# Patient Record
Sex: Male | Born: 1960 | Race: Black or African American | Hispanic: No | Smoking: Current every day smoker
Health system: Southern US, Community
[De-identification: ages and names within clinical notes are randomized; demographics above are authoritative.]

## PROBLEM LIST (undated history)

## (undated) DIAGNOSIS — I10 Essential (primary) hypertension: Secondary | ICD-10-CM

## (undated) DIAGNOSIS — E739 Lactose intolerance, unspecified: Secondary | ICD-10-CM

## (undated) DIAGNOSIS — F329 Major depressive disorder, single episode, unspecified: Secondary | ICD-10-CM

## (undated) DIAGNOSIS — F32A Depression, unspecified: Secondary | ICD-10-CM

## (undated) DIAGNOSIS — K219 Gastro-esophageal reflux disease without esophagitis: Secondary | ICD-10-CM

## (undated) DIAGNOSIS — G43909 Migraine, unspecified, not intractable, without status migrainosus: Secondary | ICD-10-CM

## (undated) DIAGNOSIS — F419 Anxiety disorder, unspecified: Secondary | ICD-10-CM

---

## 1998-02-12 ENCOUNTER — Emergency Department (HOSPITAL_COMMUNITY): Admission: EM | Admit: 1998-02-12 | Discharge: 1998-02-12 | Payer: Self-pay | Admitting: Emergency Medicine

## 1998-02-20 ENCOUNTER — Emergency Department (HOSPITAL_COMMUNITY): Admission: EM | Admit: 1998-02-20 | Discharge: 1998-02-20 | Payer: Self-pay | Admitting: Emergency Medicine

## 1999-07-07 ENCOUNTER — Encounter: Payer: Self-pay | Admitting: Emergency Medicine

## 1999-07-07 ENCOUNTER — Emergency Department (HOSPITAL_COMMUNITY): Admission: EM | Admit: 1999-07-07 | Discharge: 1999-07-07 | Payer: Self-pay | Admitting: Emergency Medicine

## 2000-02-19 ENCOUNTER — Emergency Department (HOSPITAL_COMMUNITY): Admission: EM | Admit: 2000-02-19 | Discharge: 2000-02-19 | Payer: Self-pay | Admitting: Emergency Medicine

## 2000-03-30 ENCOUNTER — Emergency Department (HOSPITAL_COMMUNITY): Admission: EM | Admit: 2000-03-30 | Discharge: 2000-03-30 | Payer: Self-pay | Admitting: Emergency Medicine

## 2001-06-22 ENCOUNTER — Emergency Department (HOSPITAL_COMMUNITY): Admission: EM | Admit: 2001-06-22 | Discharge: 2001-06-22 | Payer: Self-pay

## 2005-06-17 ENCOUNTER — Ambulatory Visit: Payer: Self-pay | Admitting: Psychiatry

## 2005-06-17 ENCOUNTER — Inpatient Hospital Stay (HOSPITAL_COMMUNITY): Admission: EM | Admit: 2005-06-17 | Discharge: 2005-06-20 | Payer: Self-pay | Admitting: Psychiatry

## 2005-07-10 ENCOUNTER — Emergency Department (HOSPITAL_COMMUNITY): Admission: EM | Admit: 2005-07-10 | Discharge: 2005-07-10 | Payer: Self-pay | Admitting: Emergency Medicine

## 2006-03-02 ENCOUNTER — Inpatient Hospital Stay (HOSPITAL_COMMUNITY): Admission: RE | Admit: 2006-03-02 | Discharge: 2006-03-06 | Payer: Self-pay | Admitting: Psychiatry

## 2006-03-02 ENCOUNTER — Emergency Department (HOSPITAL_COMMUNITY): Admission: EM | Admit: 2006-03-02 | Discharge: 2006-03-02 | Payer: Self-pay | Admitting: Emergency Medicine

## 2006-03-03 ENCOUNTER — Ambulatory Visit: Payer: Self-pay | Admitting: *Deleted

## 2006-04-12 ENCOUNTER — Emergency Department (HOSPITAL_COMMUNITY): Admission: EM | Admit: 2006-04-12 | Discharge: 2006-04-12 | Payer: Self-pay | Admitting: Emergency Medicine

## 2006-04-21 ENCOUNTER — Emergency Department (HOSPITAL_COMMUNITY): Admission: EM | Admit: 2006-04-21 | Discharge: 2006-04-21 | Payer: Self-pay | Admitting: Emergency Medicine

## 2006-09-05 ENCOUNTER — Emergency Department (HOSPITAL_COMMUNITY): Admission: EM | Admit: 2006-09-05 | Discharge: 2006-09-05 | Payer: Self-pay | Admitting: Emergency Medicine

## 2006-09-07 ENCOUNTER — Emergency Department (HOSPITAL_COMMUNITY): Admission: EM | Admit: 2006-09-07 | Discharge: 2006-09-07 | Payer: Self-pay | Admitting: Emergency Medicine

## 2006-10-06 ENCOUNTER — Emergency Department (HOSPITAL_COMMUNITY): Admission: EM | Admit: 2006-10-06 | Discharge: 2006-10-06 | Payer: Self-pay | Admitting: Emergency Medicine

## 2006-10-07 ENCOUNTER — Emergency Department (HOSPITAL_COMMUNITY): Admission: EM | Admit: 2006-10-07 | Discharge: 2006-10-07 | Payer: Self-pay | Admitting: Emergency Medicine

## 2006-12-16 ENCOUNTER — Emergency Department (HOSPITAL_COMMUNITY): Admission: EM | Admit: 2006-12-16 | Discharge: 2006-12-16 | Payer: Self-pay | Admitting: Emergency Medicine

## 2007-03-29 ENCOUNTER — Emergency Department (HOSPITAL_COMMUNITY): Admission: EM | Admit: 2007-03-29 | Discharge: 2007-03-29 | Payer: Self-pay | Admitting: Emergency Medicine

## 2007-07-17 ENCOUNTER — Emergency Department (HOSPITAL_COMMUNITY): Admission: EM | Admit: 2007-07-17 | Discharge: 2007-07-17 | Payer: Self-pay | Admitting: Emergency Medicine

## 2007-09-14 ENCOUNTER — Emergency Department (HOSPITAL_COMMUNITY): Admission: EM | Admit: 2007-09-14 | Discharge: 2007-09-14 | Payer: Self-pay | Admitting: Emergency Medicine

## 2007-09-25 ENCOUNTER — Emergency Department (HOSPITAL_COMMUNITY): Admission: EM | Admit: 2007-09-25 | Discharge: 2007-09-26 | Payer: Self-pay | Admitting: Emergency Medicine

## 2008-04-07 ENCOUNTER — Emergency Department (HOSPITAL_COMMUNITY): Admission: EM | Admit: 2008-04-07 | Discharge: 2008-04-07 | Payer: Self-pay | Admitting: Emergency Medicine

## 2008-06-10 ENCOUNTER — Emergency Department (HOSPITAL_BASED_OUTPATIENT_CLINIC_OR_DEPARTMENT_OTHER): Admission: EM | Admit: 2008-06-10 | Discharge: 2008-06-10 | Payer: Self-pay | Admitting: Emergency Medicine

## 2008-07-11 ENCOUNTER — Emergency Department (HOSPITAL_BASED_OUTPATIENT_CLINIC_OR_DEPARTMENT_OTHER): Admission: EM | Admit: 2008-07-11 | Discharge: 2008-07-11 | Payer: Self-pay | Admitting: Emergency Medicine

## 2008-10-07 ENCOUNTER — Emergency Department (HOSPITAL_COMMUNITY): Admission: EM | Admit: 2008-10-07 | Discharge: 2008-10-07 | Payer: Self-pay | Admitting: Emergency Medicine

## 2009-01-22 ENCOUNTER — Emergency Department (HOSPITAL_COMMUNITY): Admission: EM | Admit: 2009-01-22 | Discharge: 2009-01-22 | Payer: Self-pay | Admitting: Emergency Medicine

## 2009-04-06 ENCOUNTER — Emergency Department (HOSPITAL_COMMUNITY): Admission: EM | Admit: 2009-04-06 | Discharge: 2009-04-06 | Payer: Self-pay | Admitting: Emergency Medicine

## 2009-10-27 ENCOUNTER — Emergency Department (HOSPITAL_COMMUNITY): Admission: EM | Admit: 2009-10-27 | Discharge: 2009-10-27 | Payer: Self-pay | Admitting: Emergency Medicine

## 2010-01-28 ENCOUNTER — Emergency Department (HOSPITAL_COMMUNITY): Admission: EM | Admit: 2010-01-28 | Discharge: 2010-01-28 | Payer: Self-pay | Admitting: Emergency Medicine

## 2010-03-31 ENCOUNTER — Emergency Department (HOSPITAL_COMMUNITY): Admission: EM | Admit: 2010-03-31 | Discharge: 2010-03-31 | Payer: Self-pay | Admitting: Emergency Medicine

## 2010-04-24 ENCOUNTER — Emergency Department (HOSPITAL_COMMUNITY): Admission: EM | Admit: 2010-04-24 | Discharge: 2010-04-25 | Payer: Self-pay | Admitting: Emergency Medicine

## 2010-05-12 ENCOUNTER — Emergency Department (HOSPITAL_COMMUNITY): Admission: EM | Admit: 2010-05-12 | Discharge: 2010-05-12 | Payer: Self-pay | Admitting: Emergency Medicine

## 2010-05-22 ENCOUNTER — Emergency Department (HOSPITAL_COMMUNITY): Admission: EM | Admit: 2010-05-22 | Discharge: 2010-05-22 | Payer: Self-pay | Admitting: Emergency Medicine

## 2010-08-08 ENCOUNTER — Emergency Department (HOSPITAL_COMMUNITY): Admission: EM | Admit: 2010-08-08 | Discharge: 2010-08-08 | Payer: Self-pay | Admitting: Emergency Medicine

## 2010-10-20 ENCOUNTER — Emergency Department (HOSPITAL_COMMUNITY)
Admission: EM | Admit: 2010-10-20 | Discharge: 2010-10-20 | Payer: Self-pay | Source: Home / Self Care | Admitting: Emergency Medicine

## 2010-11-03 ENCOUNTER — Emergency Department (HOSPITAL_COMMUNITY)
Admission: EM | Admit: 2010-11-03 | Discharge: 2010-11-03 | Payer: Self-pay | Source: Home / Self Care | Admitting: Emergency Medicine

## 2010-11-18 ENCOUNTER — Emergency Department (HOSPITAL_COMMUNITY)
Admission: EM | Admit: 2010-11-18 | Discharge: 2010-11-18 | Payer: Self-pay | Source: Home / Self Care | Admitting: Emergency Medicine

## 2010-12-02 ENCOUNTER — Emergency Department (HOSPITAL_COMMUNITY)
Admission: EM | Admit: 2010-12-02 | Discharge: 2010-12-02 | Disposition: A | Discharge status: Home / Self Care | Payer: Self-pay | Attending: Emergency Medicine | Admitting: Emergency Medicine

## 2010-12-02 DIAGNOSIS — I10 Essential (primary) hypertension: Secondary | ICD-10-CM | POA: Insufficient documentation

## 2010-12-02 DIAGNOSIS — K219 Gastro-esophageal reflux disease without esophagitis: Secondary | ICD-10-CM | POA: Insufficient documentation

## 2010-12-02 DIAGNOSIS — R51 Headache: Secondary | ICD-10-CM | POA: Insufficient documentation

## 2011-01-14 LAB — POCT I-STAT, CHEM 8
BUN: 8 mg/dL (ref 6–23)
Calcium, Ion: 1.04 mmol/L — ABNORMAL LOW (ref 1.12–1.32)
Chloride: 107 mEq/L (ref 96–112)
Creatinine, Ser: 1.4 mg/dL (ref 0.4–1.5)
Glucose, Bld: 93 mg/dL (ref 70–99)
HCT: 47 % (ref 39.0–52.0)
Hemoglobin: 16 g/dL (ref 13.0–17.0)
Potassium: 3.9 mEq/L (ref 3.5–5.1)
Sodium: 140 mEq/L (ref 135–145)
TCO2: 22 mmol/L (ref 0–100)

## 2011-01-14 LAB — ETHANOL: Alcohol, Ethyl (B): 74 mg/dL — ABNORMAL HIGH (ref 0–10)

## 2011-02-06 LAB — DIFFERENTIAL
Basophils Absolute: 0.2 10*3/uL — ABNORMAL HIGH (ref 0.0–0.1)
Basophils Relative: 2 % — ABNORMAL HIGH (ref 0–1)
Eosinophils Absolute: 0 10*3/uL (ref 0.0–0.7)
Eosinophils Relative: 0 % (ref 0–5)
Lymphs Abs: 2.5 10*3/uL (ref 0.7–4.0)
Neutrophils Relative %: 69 % (ref 43–77)

## 2011-02-06 LAB — URINALYSIS, ROUTINE W REFLEX MICROSCOPIC
Glucose, UA: NEGATIVE mg/dL
Nitrite: NEGATIVE
Specific Gravity, Urine: 1.029 (ref 1.005–1.030)
pH: 6 (ref 5.0–8.0)

## 2011-02-06 LAB — COMPREHENSIVE METABOLIC PANEL
ALT: 21 U/L (ref 0–53)
AST: 21 U/L (ref 0–37)
Alkaline Phosphatase: 69 U/L (ref 39–117)
CO2: 28 mEq/L (ref 19–32)
Chloride: 106 mEq/L (ref 96–112)
GFR calc Af Amer: 57 mL/min — ABNORMAL LOW (ref >60)
GFR calc non Af Amer: 47 mL/min — ABNORMAL LOW (ref >60)
Glucose, Bld: 92 mg/dL (ref 70–99)
Potassium: 4.1 mEq/L (ref 3.5–5.1)
Sodium: 143 mEq/L (ref 135–145)
Total Bilirubin: 0.5 mg/dL (ref 0.3–1.2)

## 2011-02-06 LAB — RAPID URINE DRUG SCREEN, HOSP PERFORMED
Cocaine: POSITIVE — AB
Opiates: NOT DETECTED
Tetrahydrocannabinol: NOT DETECTED

## 2011-02-06 LAB — CBC
Hemoglobin: 15 g/dL (ref 13.0–17.0)
RBC: 5.45 MIL/uL (ref 4.22–5.81)
WBC: 10.6 10*3/uL — ABNORMAL HIGH (ref 4.0–10.5)

## 2011-03-17 NOTE — H&P (Signed)
NAME:  Alexander Munoz, BELMARES NO.:  [id number]   MEDICAL RECORD NO.:  [id number]          PATIENT TYPE:  IPS   LOCATION:  0305                          FACILITY:  BH   PHYSICIAN:  Anselm Jungling, MD  DATE OF BIRTH:  [birth year]   DATE OF ADMISSION:  03/02/2006  DATE OF DISCHARGE:                         PSYCHIATRIC ADMISSION ASSESSMENT   IDENTIFYING INFORMATION:  This is a 50 year old separated African-American  male who presented to the [facility] yesterday  asking for detoxification from crack and alcohol.  He was medically cleared  at [facility].  He was complaining of some chest pain, however, his EKG  confirmed tachycardia only.  Mirko was last with [place] in August 2006.  That was  his first admission here.  He stated at that time that he had had many prior  inpatient treatments for substance abuse and he decided to try again on his  own to give up substances.  Since leaving here last August he apparently  enrolled in [address] in [place] through the [employer].  Unfortunately he relapsed in January and lost his placement at [facility].  Since January he has been working part-time doing temporary jobs  and is saying that once again he is ready to try to get clean and sober.   PAST PSYCHIATRIC HISTORY:  As already stated he was here with [place] last August  in 2006.  He has had many other inpatient treatment stays.   SOCIAL HISTORY:  He is separated from his wife since 2003.  He has two  children, a son 26 that he does not see, his son lives in [place].  He has  had some contact with his 42-year-old daughter mostly by phone.  Both of  these children have different mothers and he never married those mothers.   ALCOHOL AND DRUG ABUSE HISTORY:  He began using drugs at age 39,  specifically cocaine.  He also uses alcohol.  Yesterday his alcohol level  was less than 5.  His UDS was positive for cocaine.   PRIMARY CARE Harjas Biggins:   He is currently enrolled for primary care at the [facility] in [place].   MEDICAL PROBLEMS:  He has migraines treated with Imitrex injections as well  as ibuprofen 800 mg.   ALLERGIES:  No known drug allergies.   PHYSICAL EXAMINATION:  GENERAL:  Well-developed, well-nourished African-  American male who appears his stated age.  He was medically cleared at  [facility].  He had no remarkable findings.  VITAL SIGNS:  On admission showed that he is 67 inches tall, he weighs 184,  blood pressure 170/100, pulse 58, respirations 20, temperature 98.7.  MENTAL STATUS EXAM:  He is alert and oriented x3.  He is appropriately  groomed, dressed and nourished.  His motor and gait are normal.  He had good  eye contact.  His speech had a normal rate, rhythm and tone.  His mood is a  little depressed.  His affect is congruent, however, it is appropriate to  his situation.  His thought  processes are clear, rationale and goal  oriented.  Judgment and insight are fair.  Concentration and memory are  intact.  Intelligence is at least average.  He denies suicidal or homicidal  ideation.  He denies auditory or visual hallucinations.  He denies the need  to start any antidepressant at this point in time.  He states that when he  is not using drugs he is not depressed.   DIAGNOSES:  Axis I.  Polysubstance dependence, crack and alcohol, substance  induced mood disorder.  Axis II.  No diagnosis.  Axis III.  Migraines.  Axis IV.  Problems with primary support group, housing problem, economic  problems.  __________   PLAN:  The plan is to admit for detoxification from crack and toward that  end he was begun on the low dose Librium protocol.  We will also have the  case manager to call the [place] in [place].  They have a SARTP (substance  abuse recovery treatment program) it is a 30-day in-house program and he  could then transition to the IOP, etc.  The telephone number there is [phone], ask for Tiana Loft for a bed date.      Mickie Leonarda Salon, P.A.-C.      Anselm Jungling, MD  Electronically Signed    MD/MEDQ  D:  03/03/2006  T:  03/04/2006  Job:  [id number]

## 2011-03-17 NOTE — H&P (Signed)
NAME:  Alexander Munoz, LAPINSKY NO.:  [id number]   MEDICAL RECORD NO.:  [id number]          PATIENT TYPE:  IPS   LOCATION:  0304                          FACILITY:  BH   PHYSICIAN:  Anselm Jungling, MD  DATE OF BIRTH:  [birth year]   DATE OF ADMISSION:  06/17/2005  DATE OF DISCHARGE:                         PSYCHIATRIC ADMISSION ASSESSMENT   IDENTIFYING INFORMATION:  This is a 50 year old separated African-American  male.  The patient went to [facility] yesterday asking  for help with substance abuse.  He was sent to [facility] for clearance.  He told the emergency department staff there that he felt like he was at his  end.  The patient has had prior treatment for substance abuse.  In 1987 he  was at [facility] and in 2004 he was at the [facility] program in [place].   SOCIAL HISTORY:  He is a high school grad.  He has worked as a Curator.  He  has been married once.  He has 2 children by different mothers and he did  not marry any of these people.  His son is 75; his daughter is 7.   FAMILY HISTORY:  He denies.   ALCOHOL AND DRUG ABUSE:  He began using drugs at age 27.  He is currently  using $150 a day of crack for the past 2-1/2 months.  He has been drinking  liquor, a fifth a day for the past 2 days.  He was drinking beer.  He had  three 40-ounces yesterday.  At [facility], his CIWA was  47.  His alcohol level was 23, and his urine drug screen was positive for  cocaine only.  He does smoke.  He smokes 1 pack a day; not sure exactly how  many years he has been smoking.   PAST MEDICAL HISTORY:  He has no primary care Dasia Guerrier.  He is not  prescribed medications.  He has no known medical problems.  He denies any  drug allergies.   POSITIVE PHYSICAL FINDINGS:  PHYSICAL EXAMINATION:  He appears to be a well-  nourished, well-developed African-American male who appears his stated age.  His vital signs were unremarkable.  There was no  elevation and he has no  evidence for withdrawal.   MENTAL STATUS EXAM:  He is alert and oriented x3.  He is drowsy but  arousable.  He is appropriately groomed, dressed and nourished.  His speech  is soft.  He responds briefly.  His mood is depressed.  His affect is  congruent.  This thought processes are clear, rational and goal oriented.  He specifically requests getting hooked up with the [place].  Judgment and  insight are fair.  Concentration and memory are intact.  Intelligence is  average.  Today, he is not actively suicidal.  He was never homicidal.  He  denies auditory or visual hallucinations.   ADMISSION DIAGNOSES:  AXIS I:  Polysubstance dependence, depressed at  present.  AXIS II:  Deferred.  AXIS III:  History for migraines treated with Imitrex in the past.  AXIS  IV:  Severe, problems with primary support group, occupational,  housing, economic issues.  He has had prison time due to child support  issues and theft.  AXIS V:  Global assessment of function is 25.   PLAN:  The plan is to help him withdraw from his substances.  Towards that  end, he was begun on the low-dose Librium protocol.  He will be started on  Wellbutrin XL 150 mg p.o. daily as he is neurovegetatively depressed, and  also he smokes.  We will have the social worker make contact with the [place].  They have 2 programs in [place], the homeless vet program as well as  [facility].  He may be able to transfer to that longer program once he detoxes  here.      Mickie Leonarda Salon, P.A.-C.      Anselm Jungling, MD  Electronically Signed    MD/MEDQ  D:  06/17/2005  T:  06/17/2005  Job:  [id number]

## 2011-03-17 NOTE — Discharge Summary (Signed)
NAME:  Alexander Munoz, Alexander Munoz NO.:  [id number]   MEDICAL RECORD NO.:  [id number]          PATIENT TYPE:  IPS   LOCATION:  0305                          FACILITY:  BH   PHYSICIAN:  Anselm Jungling, MD  DATE OF BIRTH:  1960-12-18   DATE OF ADMISSION:  03/02/2006  DATE OF DISCHARGE:  03/06/2006                                 DISCHARGE SUMMARY   IDENTIFYING DATA AND REASON FOR ADMISSION:  The patient is a 50 year old  separated African-American male who presented to [facility] asking for detoxification from crack cocaine and alcohol.  He was  medically cleared at [facility].  He was last with [place] in August of  2006.  Please refer to the admission note for further details pertaining to  the symptoms, circumstances, and history that led to his hospitalization.  He was given an initial Axis I diagnosis of polysubstance dependence and  substance-induced mood disorder.   MEDICAL AND LABORATORY:  The patient was medically cleared at [facility] prior to transfer to our inpatient  psychiatric service.  Once  here, he was medically and physically assessed by the psychiatric nurse  practitioner.  He came to [place] with a history of migraines treated with  Imitrex injections and ibuprofen.  He had had some chest pain that was  evaluated at [facility], and an EKG showed tachycardia only.  There were no  significant medical issues during this brief inpatient psychiatric stay.   HOSPITAL COURSE:  The patient was admitted to the adult inpatient  psychiatric service and placed on a withdrawal protocol.  He was given  Symmetrel 100 mg b.i.d. for cocaine craving.  He presented as a well-  nourished, normally developed, pleasant and cooperative individual who  seemed to have a sincere desire to become clean and sober.  He expressed  strong interest in obtaining follow-up chemical dependency services through  the [employer] but was not able  to arrange this by the time of  discharge.   The patient was felt to be ready for discharge on the fifth hospital day.   AFTERCARE:  The patient was to follow up with Dr. Lang Snow at [facility] on Mar 08, 2006, and he was to [facility] himself at [facility] [place] any Monday through Friday on a walk-in basis.  From there, he could have outpatient chemical dependency treatment.   DISCHARGE MEDICATIONS:  Symmetrel 100 mg b.i.d..   DISCHARGE DIAGNOSES:  AXIS I:  Polysubstance dependence, early remission.  AXIS II:  Deferred.  AXIS III:  History of migraine.  AXIS IV:  Stressors severe.  AXIS V:  Global Assessment of Functioning on discharge 60.          ______________________________  Anselm Jungling, MD  Electronically Signed    SPB/MEDQ  D:  03/07/2006  T:  03/08/2006  Job:  [id number]

## 2011-03-17 NOTE — Discharge Summary (Signed)
NAME:  BROC, CASPERS NO.:  [id number]   MEDICAL RECORD NO.:  [id number]          PATIENT TYPE:  IPS   LOCATION:  0500                          FACILITY:  BH   PHYSICIAN:  Anselm Jungling, MD  DATE OF BIRTH:  October 26, 1961   DATE OF ADMISSION:  06/17/2005  DATE OF DISCHARGE:  06/20/2005                                 DISCHARGE SUMMARY   IDENTIFYING DATA AND REASON FOR ADMISSION:  This was the first [facility] admission  for this 49 year old separated African-American male who had gone to the  [facility] asking for help with substance abuse.  When he went to [facility] for medical clearance, he told the  emergency room staff there that he felt that he was at his wit's end.  He  came to [place] with a history of prior courses of inpatient treatment for  substance abuse, the most recent in 2004 in [place].  Please refer to  the admission note for further details pertaining to the symptoms,  circumstances, and history that led to his hospitalization at [facility].   INITIAL DIAGNOSTIC IMPRESSION:  AXIS I:  Polysubstance dependence, and  depressive disorder not otherwise specified.  AXIS II:  Deferred.  AXIS III:  History of migraine.  AXIS IV:  Stressors, severe.  AXIS V:  Global assessment of function on admission 35.   MEDICAL AND LABORATORY:  There were no significant medical issues during  this brief inpatient stay.  A chemistry panel showed decreased albumin at  3.4.  A TSH level was within normal limits.  A urinalysis was within normal  limits.  The patient was physically assessed by the nurse practitioner upon  admission.   HOSPITAL COURSE:  The patient was admitted to the adult inpatient service.  He was placed on an alcohol withdrawal protocol based upon Librium.  He was  continued on Wellbutrin XL 150 mg daily throughout his inpatient stay which  was well tolerated.  He was a good and pleasant participant throughout.   AFTERCARE  PLANS:  He was to go to [facility] for intake for substance abuse  treatment on June 28, 2005.  He was also to go to the [facility] for an  interview on the day of discharge, June 20, 2005.  He was instructed to  attend AA meetings, reconnect with his AA sponsor, and apply for services  through [facility] if medical need arises.   DISCHARGE MEDICATIONS:  Wellbutrin XL 150 mg daily.   DISCHARGE DIAGNOSES:  AXIS I:  Alcohol dependence and depressive disorder  not otherwise specified.  AXIS II:  Deferred.  AXIS III:  No acute or chronic illnesses.  AXIS IV:  Stressors, severe.  AXIS V:  Global assessment of function on discharge 55.           ______________________________  Anselm Jungling, MD  Electronically Signed     SPB/MEDQ  D:  07/13/2005  T:  07/13/2005  Job:  [id number]

## 2011-04-14 ENCOUNTER — Ambulatory Visit (HOSPITAL_COMMUNITY)
Admission: RE | Admit: 2011-04-14 | Discharge: 2011-04-14 | Disposition: A | Discharge status: Home / Self Care | Payer: Self-pay | Source: Ambulatory Visit | Attending: Psychiatry | Admitting: Psychiatry

## 2011-04-14 ENCOUNTER — Emergency Department (HOSPITAL_COMMUNITY)
Admission: EM | Admit: 2011-04-14 | Discharge: 2011-04-14 | Disposition: A | Discharge status: Home / Self Care | Payer: Self-pay | Attending: Emergency Medicine | Admitting: Emergency Medicine

## 2011-04-14 DIAGNOSIS — F1910 Other psychoactive substance abuse, uncomplicated: Secondary | ICD-10-CM | POA: Insufficient documentation

## 2011-04-14 DIAGNOSIS — F1020 Alcohol dependence, uncomplicated: Secondary | ICD-10-CM | POA: Insufficient documentation

## 2011-04-14 DIAGNOSIS — I10 Essential (primary) hypertension: Secondary | ICD-10-CM | POA: Insufficient documentation

## 2011-04-14 DIAGNOSIS — K219 Gastro-esophageal reflux disease without esophagitis: Secondary | ICD-10-CM | POA: Insufficient documentation

## 2011-04-14 LAB — COMPREHENSIVE METABOLIC PANEL
ALT: 26 U/L (ref 0–53)
Alkaline Phosphatase: 53 U/L (ref 39–117)
BUN: 12 mg/dL (ref 6–23)
CO2: 26 mEq/L (ref 19–32)
Calcium: 9 mg/dL (ref 8.4–10.5)
GFR calc Af Amer: >60 mL/min (ref >60)
GFR calc non Af Amer: 57 mL/min — ABNORMAL LOW (ref >60)
Glucose, Bld: 81 mg/dL (ref 70–99)
Sodium: 136 mEq/L (ref 135–145)

## 2011-04-14 LAB — CBC
HCT: 41.5 % (ref 39.0–52.0)
Hemoglobin: 13.8 g/dL (ref 13.0–17.0)
MCH: 26.7 pg (ref 26.0–34.0)
RBC: 5.16 MIL/uL (ref 4.22–5.81)

## 2011-04-14 LAB — RAPID URINE DRUG SCREEN, HOSP PERFORMED
Opiates: NOT DETECTED
Tetrahydrocannabinol: NOT DETECTED

## 2011-04-17 ENCOUNTER — Emergency Department (HOSPITAL_COMMUNITY)
Admission: EM | Admit: 2011-04-17 | Discharge: 2011-04-18 | Disposition: A | Discharge status: Home / Self Care | Payer: Self-pay | Attending: Emergency Medicine | Admitting: Emergency Medicine

## 2011-04-17 DIAGNOSIS — F1410 Cocaine abuse, uncomplicated: Secondary | ICD-10-CM | POA: Insufficient documentation

## 2011-04-17 DIAGNOSIS — F17200 Nicotine dependence, unspecified, uncomplicated: Secondary | ICD-10-CM | POA: Insufficient documentation

## 2011-04-17 DIAGNOSIS — F1010 Alcohol abuse, uncomplicated: Secondary | ICD-10-CM | POA: Insufficient documentation

## 2011-04-17 DIAGNOSIS — I10 Essential (primary) hypertension: Secondary | ICD-10-CM | POA: Insufficient documentation

## 2011-04-17 DIAGNOSIS — K219 Gastro-esophageal reflux disease without esophagitis: Secondary | ICD-10-CM | POA: Insufficient documentation

## 2011-04-17 LAB — BASIC METABOLIC PANEL
Calcium: 9.3 mg/dL (ref 8.4–10.5)
GFR calc non Af Amer: 54 mL/min — ABNORMAL LOW (ref >60)
Sodium: 136 mEq/L (ref 135–145)

## 2011-04-17 LAB — URINALYSIS, ROUTINE W REFLEX MICROSCOPIC
Bilirubin Urine: NEGATIVE
Nitrite: NEGATIVE
Specific Gravity, Urine: 1.015 (ref 1.005–1.030)
pH: 7 (ref 5.0–8.0)

## 2011-04-17 LAB — CBC
MCHC: 34 g/dL (ref 30.0–36.0)
RDW: 14.2 % (ref 11.5–15.5)

## 2011-04-17 LAB — RAPID URINE DRUG SCREEN, HOSP PERFORMED
Barbiturates: NOT DETECTED
Cocaine: POSITIVE — AB
Opiates: NOT DETECTED

## 2011-04-17 LAB — DIFFERENTIAL
Basophils Absolute: 0 10*3/uL (ref 0.0–0.1)
Basophils Relative: 0 % (ref 0–1)
Eosinophils Relative: 1 % (ref 0–5)
Monocytes Absolute: 0.7 10*3/uL (ref 0.1–1.0)
Neutro Abs: 4.8 10*3/uL (ref 1.7–7.7)

## 2011-04-17 LAB — URINE MICROSCOPIC-ADD ON

## 2011-04-17 LAB — ETHANOL: Alcohol, Ethyl (B): 117 mg/dL — ABNORMAL HIGH (ref 0–11)

## 2011-07-27 LAB — HEPATIC FUNCTION PANEL
ALT: 25
AST: 26
Albumin: 3.7
Bilirubin, Direct: 0.1
Total Bilirubin: 0.9

## 2011-07-27 LAB — CBC
MCV: 81.9
RBC: 4.73
WBC: 9.4

## 2011-07-27 LAB — DIFFERENTIAL
Lymphocytes Relative: 31
Lymphs Abs: 2.9
Monocytes Relative: 6
Neutro Abs: 5.7
Neutrophils Relative %: 61

## 2011-07-27 LAB — PROTIME-INR: Prothrombin Time: 13.5

## 2011-07-27 LAB — ETHANOL: Alcohol, Ethyl (B): <5

## 2011-07-27 LAB — URINALYSIS, ROUTINE W REFLEX MICROSCOPIC
Bilirubin Urine: NEGATIVE
Nitrite: NEGATIVE
Specific Gravity, Urine: 1.035 — ABNORMAL HIGH
pH: 6

## 2011-07-27 LAB — POCT I-STAT, CHEM 8
BUN: 12
Chloride: 102
Creatinine, Ser: 1.6 — ABNORMAL HIGH
Glucose, Bld: 97
Potassium: 3.7

## 2011-07-27 LAB — RAPID URINE DRUG SCREEN, HOSP PERFORMED
Opiates: NOT DETECTED
Tetrahydrocannabinol: NOT DETECTED

## 2011-07-27 LAB — TSH: TSH: 1.488

## 2011-08-08 LAB — RAPID URINE DRUG SCREEN, HOSP PERFORMED
Barbiturates: NOT DETECTED
Benzodiazepines: POSITIVE — AB
Opiates: POSITIVE — AB

## 2011-08-08 LAB — BASIC METABOLIC PANEL
CO2: 32
Chloride: 103
Creatinine, Ser: 1.22
GFR calc Af Amer: >60
Glucose, Bld: 90

## 2011-08-08 LAB — CBC
Hemoglobin: 13.2
MCHC: 33.3
MCV: 82.6
RBC: 4.81
RDW: 14.7

## 2011-08-08 LAB — DIFFERENTIAL
Basophils Relative: 1
Eosinophils Absolute: 0.1 — ABNORMAL LOW
Monocytes Absolute: 0.7
Monocytes Relative: 7

## 2011-08-13 ENCOUNTER — Emergency Department (HOSPITAL_COMMUNITY)
Admission: EM | Admit: 2011-08-13 | Discharge: 2011-08-13 | Disposition: A | Discharge status: Home / Self Care | Payer: Self-pay | Attending: Emergency Medicine | Admitting: Emergency Medicine

## 2011-08-13 DIAGNOSIS — G43909 Migraine, unspecified, not intractable, without status migrainosus: Secondary | ICD-10-CM | POA: Insufficient documentation

## 2011-08-20 ENCOUNTER — Emergency Department (HOSPITAL_COMMUNITY)
Admission: EM | Admit: 2011-08-20 | Discharge: 2011-08-20 | Disposition: A | Discharge status: Home / Self Care | Payer: Self-pay | Attending: Emergency Medicine | Admitting: Emergency Medicine

## 2011-08-20 DIAGNOSIS — G43909 Migraine, unspecified, not intractable, without status migrainosus: Secondary | ICD-10-CM | POA: Insufficient documentation

## 2011-08-20 DIAGNOSIS — H53149 Visual discomfort, unspecified: Secondary | ICD-10-CM | POA: Insufficient documentation

## 2011-08-20 DIAGNOSIS — I10 Essential (primary) hypertension: Secondary | ICD-10-CM | POA: Insufficient documentation

## 2011-08-23 ENCOUNTER — Emergency Department (HOSPITAL_COMMUNITY)
Admission: EM | Admit: 2011-08-23 | Discharge: 2011-08-24 | Disposition: A | Discharge status: Home / Self Care | Payer: Non-veteran care | Attending: Emergency Medicine | Admitting: Emergency Medicine

## 2011-08-23 DIAGNOSIS — R51 Headache: Secondary | ICD-10-CM | POA: Insufficient documentation

## 2011-08-23 DIAGNOSIS — I10 Essential (primary) hypertension: Secondary | ICD-10-CM | POA: Insufficient documentation

## 2011-08-23 DIAGNOSIS — F17200 Nicotine dependence, unspecified, uncomplicated: Secondary | ICD-10-CM | POA: Insufficient documentation

## 2011-11-04 ENCOUNTER — Encounter: Payer: Self-pay | Admitting: Emergency Medicine

## 2011-11-04 ENCOUNTER — Emergency Department (HOSPITAL_COMMUNITY)
Admission: EM | Admit: 2011-11-04 | Discharge: 2011-11-04 | Disposition: A | Discharge status: Home / Self Care | Payer: Non-veteran care | Attending: Emergency Medicine | Admitting: Emergency Medicine

## 2011-11-04 DIAGNOSIS — R51 Headache: Secondary | ICD-10-CM | POA: Insufficient documentation

## 2011-11-04 HISTORY — DX: Migraine, unspecified, not intractable, without status migrainosus: G43.909

## 2011-11-04 MED ORDER — DIPHENHYDRAMINE HCL 50 MG/ML IJ SOLN
25.0000 mg | Freq: Once | INTRAMUSCULAR | Status: AC
  Administered 2011-11-04: 25 mg via INTRAVENOUS
  Filled 2011-11-04: qty 1

## 2011-11-04 MED ORDER — METOCLOPRAMIDE HCL 5 MG/ML IJ SOLN
10.0000 mg | Freq: Once | INTRAMUSCULAR | Status: AC
  Administered 2011-11-04: 10 mg via INTRAVENOUS
  Filled 2011-11-04: qty 2

## 2011-11-04 MED ORDER — SODIUM CHLORIDE 0.9 % IV BOLUS (SEPSIS)
1000.0000 mL | Freq: Once | INTRAVENOUS | Status: AC
  Administered 2011-11-04: 1000 mL via INTRAVENOUS

## 2011-11-04 MED ORDER — DEXAMETHASONE SODIUM PHOSPHATE 10 MG/ML IJ SOLN
10.0000 mg | Freq: Once | INTRAMUSCULAR | Status: AC
  Administered 2011-11-04: 10 mg via INTRAVENOUS
  Filled 2011-11-04: qty 1

## 2011-11-04 NOTE — ED Notes (Signed)
MD at bedside. Dr. Opitz at bedside.  

## 2011-11-04 NOTE — ED Notes (Signed)
Pt brought by EMS, from home, c/o severe migraine that started last night around 2000. Pt noted to be photophobic, holding the head, rates pain as 10/10.

## 2011-11-04 NOTE — ED Notes (Signed)
Pt c/o migraine headache. Pt states it started last night at 2000. Pt states he went to sleep and then woke up with headache. Pt states he has a history of migraine headaches. Had nausea earlier. Pt denies "worst headache of life."

## 2011-11-04 NOTE — ED Provider Notes (Signed)
History     CSN: [id number]  Arrival date & time 11/04/11  0440   First MD Initiated Contact with Patient 11/04/11 [phone]      Chief Complaint  Patient presents with  . Migraine    (Consider location/radiation/quality/duration/timing/severity/associated sxs/prior treatment) Patient is a 51 y.o. male presenting with migraine. The history is provided by the patient.  Migraine This is a recurrent problem. The current episode started 6 to 12 hours ago. The problem occurs constantly. The problem has been gradually worsening. Associated symptoms include headaches. Pertinent negatives include no chest pain, no abdominal pain and no shortness of breath. Exacerbated by: Light and noise. The symptoms are relieved by nothing. He has tried nothing for the symptoms.   patient has long-standing history of migraine headaches followed by the [facility]. He is prescribed medications but he does not take the right away if they do not help his headaches. Pain described as throbbing. Located frontal and posterior repair. No radiation of pain. No fever or chills. This is not worse headache of life. Was gradual in onset. Patient denies taking any Coumadin or blood thinners. No weakness or numbness. No difficulty with speech or walking.  Past Medical History  Diagnosis Date  . Migraines     No past surgical history on file.  No family history on file.  History  Substance Use Topics  . Smoking status: Not on file  . Smokeless tobacco: Not on file  . Alcohol Use:       Review of Systems  Constitutional: Negative for fever and chills.  HENT: Negative for neck pain and neck stiffness.   Eyes: Negative for pain.  Respiratory: Negative for shortness of breath.   Cardiovascular: Negative for chest pain.  Gastrointestinal: Negative for abdominal pain.  Genitourinary: Negative for dysuria.  Musculoskeletal: Negative for back pain.  Skin: Negative for rash.  Neurological: Positive for headaches.  All  other systems reviewed and are negative.    Allergies  Review of patient's allergies indicates no known allergies.  Home Medications  No current outpatient prescriptions on file.  BP 141/93  Pulse 72  Temp(Src) 97.5 F (36.4 C) (Oral)  Resp 16  SpO2 98%  Physical Exam  Constitutional: He is oriented to person, place, and time. He appears well-developed and well-nourished.  HENT:  Head: Normocephalic and atraumatic.  Eyes: Conjunctivae and EOM are normal. Pupils are equal, round, and reactive to light.  Neck: Full passive range of motion without pain. Neck supple. No thyromegaly present.       No meningismus  Cardiovascular: Normal rate, regular rhythm, S1 normal, S2 normal and intact distal pulses.   Pulmonary/Chest: Effort normal and breath sounds normal.  Abdominal: Soft. Bowel sounds are normal. There is no tenderness. There is no CVA tenderness.  Musculoskeletal: Normal range of motion.  Neurological: He is alert and oriented to person, place, and time. He has normal strength and normal reflexes. No cranial nerve deficit or sensory deficit. He displays a negative Romberg sign. GCS eye subscore is 4. GCS verbal subscore is 5. GCS motor subscore is 6.       Normal Gait  Skin: Skin is warm and dry. No rash noted. No cyanosis. Nails show no clubbing.  Psychiatric: He has a normal mood and affect. His speech is normal and behavior is normal.    ED Course  Procedures (including critical care time)  IV fluids and headache cocktail. Repeat neuro exam unchanged and symptoms improving  MDM   Headache  with history of migraines and presentation consistent with same. Patient improving in the ED and has no neuro deficits. No indication for CT scan of brain , labs or emergent imaging otherwise.        Sunnie Nielsen, MD 11/04/11 [phone]

## 2012-01-03 ENCOUNTER — Encounter (HOSPITAL_COMMUNITY): Payer: Self-pay | Admitting: *Deleted

## 2012-01-03 ENCOUNTER — Emergency Department (HOSPITAL_COMMUNITY)
Admission: EM | Admit: 2012-01-03 | Discharge: 2012-01-04 | Disposition: A | Discharge status: Home / Self Care | Payer: Non-veteran care | Attending: Emergency Medicine | Admitting: Emergency Medicine

## 2012-01-03 ENCOUNTER — Emergency Department (HOSPITAL_COMMUNITY): Payer: Non-veteran care

## 2012-01-03 DIAGNOSIS — Z79899 Other long term (current) drug therapy: Secondary | ICD-10-CM | POA: Insufficient documentation

## 2012-01-03 DIAGNOSIS — R51 Headache: Secondary | ICD-10-CM | POA: Insufficient documentation

## 2012-01-03 DIAGNOSIS — I10 Essential (primary) hypertension: Secondary | ICD-10-CM | POA: Insufficient documentation

## 2012-01-03 HISTORY — DX: Essential (primary) hypertension: I10

## 2012-01-03 MED ORDER — ONDANSETRON 8 MG PO TBDP
8.0000 mg | ORAL_TABLET | Freq: Once | ORAL | Status: AC
  Administered 2012-01-03: 8 mg via ORAL
  Filled 2012-01-03: qty 1

## 2012-01-03 MED ORDER — HYDROMORPHONE HCL PF 2 MG/ML IJ SOLN
2.0000 mg | Freq: Once | INTRAMUSCULAR | Status: AC
  Administered 2012-01-03: 2 mg via INTRAMUSCULAR
  Filled 2012-01-03: qty 1

## 2012-01-03 NOTE — ED Provider Notes (Signed)
History     CSN: [id number]  Arrival date & time 01/03/12  2218   First MD Initiated Contact with Patient 01/03/12 2302      Chief Complaint  Patient presents with  . Hypertension    (Consider location/radiation/quality/duration/timing/severity/associated sxs/prior treatment) HPI Comments: Alexander Munoz is a 51 y.o. Male who has had a headache for 2 days and feels like his blood pressure is elevated. He has been taking his atenolol sporadically. He has no blurred vision, nausea, vomiting, chest pain, weakness, or dizziness. He tried [place] powder with relief of pain, but the pain returned. The headache is unchanged with sitting, walking, talking or eating.  The history is provided by the patient.    Past Medical History  Diagnosis Date  . Migraines   . Hypertension     History reviewed. No pertinent past surgical history.  History reviewed. No pertinent family history.  History  Substance Use Topics  . Smoking status: Not on file  . Smokeless tobacco: Not on file  . Alcohol Use:       Review of Systems  All other systems reviewed and are negative.    Allergies  Review of patient's allergies indicates no known allergies.  Home Medications   Current Outpatient Rx  Name Route Sig Dispense Refill  . ATENOLOL 50 MG PO TABS Oral Take 50 mg by mouth daily.    Marland Kitchen FLUTICASONE PROPIONATE 50 MCG/ACT NA SUSP Nasal Place 2 sprays into the nose daily.    Marland Kitchen PANTOPRAZOLE SODIUM 40 MG PO TBEC Oral Take 40 mg by mouth daily.    Marland Kitchen HYDROCODONE-ACETAMINOPHEN 5-325 MG PO TABS Oral Take 1 tablet by mouth every 4 (four) hours as needed for pain. 10 tablet 0    BP 134/86  Pulse 75  Temp(Src) 98 F (36.7 C) (Oral)  Resp 20  SpO2 100%  Physical Exam  Nursing note and vitals reviewed. Constitutional: He is oriented to person, place, and time. He appears well-developed and well-nourished. He appears distressed (uncomfortable).  HENT:  Head: Normocephalic and atraumatic.  Right  Ear: External ear normal.  Left Ear: External ear normal.  Eyes: Conjunctivae and EOM are normal. Pupils are equal, round, and reactive to light.  Neck: Normal range of motion and phonation normal. Neck supple. No tracheal deviation present.       No meningismus  Cardiovascular: Normal rate, regular rhythm, normal heart sounds and intact distal pulses.   Pulmonary/Chest: Effort normal and breath sounds normal. He exhibits no bony tenderness.  Abdominal: Soft. Normal appearance. There is no tenderness.  Musculoskeletal: Normal range of motion.  Lymphadenopathy:    He has no cervical adenopathy.  Neurological: He is alert and oriented to person, place, and time. He has normal strength. No cranial nerve deficit or sensory deficit. He exhibits normal muscle tone. Coordination normal.       Normal gait  Skin: Skin is warm, dry and intact.  Psychiatric: He has a normal mood and affect. His behavior is normal. Judgment and thought content normal.    ED Course  Procedures (including critical care time) ED Treatment: IM Dilaudid and PO Zofran  1:06 AM Reevaluation with update and discussion. After initial assessment and treatment, an updated evaluation reveals HA almost gone. No vomiting. D/C BP noted. Johney Perotti L     Labs Reviewed - No data to display Ct Head Wo Contrast  01/03/2012  *RADIOLOGY REPORT*  Clinical Data: Headache  CT HEAD WITHOUT CONTRAST  Technique:  Contiguous axial images were  obtained from the base of the skull through the vertex without contrast.  Comparison: 06/10/2008  Findings: Normal ventricular morphology. No midline shift or mass effect. Normal appearance of brain parenchyma. No intracranial hemorrhage, mass lesion, or acute infarction. Visualized paranasal sinuses and mastoid air cells clear. Bones unremarkable.  IMPRESSION: No acute intracranial abnormalities. No interval change.  Original Report Authenticated By: Lollie Marrow, M.D.     1. Headache       MDM   Nonspecific headache, doubt hypertensive emergency. Possible tension headache. Doubt meningitis, encephalitis, serious bacterial infection, sinusitis or metabolic instability.   Plan: Home Medications- Rx Norco; Home Treatments- continue Atenolol; Recommended follow up- PCP in 1 week        Flint Melter, MD 01/04/12 [phone]

## 2012-01-03 NOTE — ED Notes (Signed)
Pt in c/o headache and hypertension, states his BP has been elevated at home

## 2012-01-04 MED ORDER — HYDROCODONE-ACETAMINOPHEN 5-325 MG PO TABS: 1.0000 | ORAL_TABLET | ORAL | Status: AC | PRN

## 2012-01-04 NOTE — Discharge Instructions (Signed)
See your doctor, within one week for a checkup. Take your blood pressure medicine every day as prescribed.    \ Headaches, Frequently Asked Questions MIGRAINE HEADACHES Q: What is migraine? What causes it? How can I treat it? A: Generally, migraine headaches begin as a dull ache. Then they develop into a constant, throbbing, and pulsating pain. You may experience pain at the temples. You may experience pain at the front or back of one or both sides of the head. The pain is usually accompanied by a combination of:  Nausea.   Vomiting.   Sensitivity to light and noise.  Some people (about 15%) experience an aura (see below) before an attack. The cause of migraine is believed to be chemical reactions in the brain. Treatment for migraine may include over-the-counter or prescription medications. It may also include self-help techniques. These include relaxation training and biofeedback.  Q: What is an aura? A: About 15% of people with migraine get an "aura". This is a sign of neurological symptoms that occur before a migraine headache. You may see wavy or jagged lines, dots, or flashing lights. You might experience tunnel vision or blind spots in one or both eyes. The aura can include visual or auditory hallucinations (something imagined). It may include disruptions in smell (such as strange odors), taste or touch. Other symptoms include:  Numbness.   A "pins and needles" sensation.   Difficulty in recalling or speaking the correct word.  These neurological events may last as long as 60 minutes. These symptoms will fade as the headache begins. Q: What is a trigger? A: Certain physical or environmental factors can lead to or "trigger" a migraine. These include:  Foods.   Hormonal changes.   Weather.   Stress.  It is important to remember that triggers are different for everyone. To help prevent migraine attacks, you need to figure out which triggers affect you. Keep a headache diary. This  is a good way to track triggers. The diary will help you talk to your healthcare professional about your condition. Q: Does weather affect migraines? A: Bright sunshine, hot, humid conditions, and drastic changes in barometric pressure may lead to, or "trigger," a migraine attack in some people. But studies have shown that weather does not act as a trigger for everyone with migraines. Q: What is the link between migraine and hormones? A: Hormones start and regulate many of your body's functions. Hormones keep your body in balance within a constantly changing environment. The levels of hormones in your body are unbalanced at times. Examples are during menstruation, pregnancy, or menopause. That can lead to a migraine attack. In fact, about three quarters of all women with migraine report that their attacks are related to the menstrual cycle.  Q: Is there an increased risk of stroke for migraine sufferers? A: The likelihood of a migraine attack causing a stroke is very remote. That is not to say that migraine sufferers cannot have a stroke associated with their migraines. In persons under age 58, the most common associated factor for stroke is migraine headache. But over the course of a person's normal life span, the occurrence of migraine headache may actually be associated with a reduced risk of dying from cerebrovascular disease due to stroke.  Q: What are acute medications for migraine? A: Acute medications are used to treat the pain of the headache after it has started. Examples over-the-counter medications, NSAIDs, ergots, and triptans.  Q: What are the triptans? A: Triptans are the newest  class of abortive medications. They are specifically targeted to treat migraine. Triptans are vasoconstrictors. They moderate some chemical reactions in the brain. The triptans work on receptors in your brain. Triptans help to restore the balance of a neurotransmitter called serotonin. Fluctuations in levels of  serotonin are thought to be a main cause of migraine.  Q: Are over-the-counter medications for migraine effective? A: Over-the-counter, or "OTC," medications may be effective in relieving mild to moderate pain and associated symptoms of migraine. But you should see your caregiver before beginning any treatment regimen for migraine.  Q: What are preventive medications for migraine? A: Preventive medications for migraine are sometimes referred to as "prophylactic" treatments. They are used to reduce the frequency, severity, and length of migraine attacks. Examples of preventive medications include antiepileptic medications, antidepressants, beta-blockers, calcium channel blockers, and NSAIDs (nonsteroidal anti-inflammatory drugs). Q: Why are anticonvulsants used to treat migraine? A: During the past few years, there has been an increased interest in antiepileptic drugs for the prevention of migraine. They are sometimes referred to as "anticonvulsants". Both epilepsy and migraine may be caused by similar reactions in the brain.  Q: Why are antidepressants used to treat migraine? A: Antidepressants are typically used to treat people with depression. They may reduce migraine frequency by regulating chemical levels, such as serotonin, in the brain.  Q: What alternative therapies are used to treat migraine? A: The term "alternative therapies" is often used to describe treatments considered outside the scope of conventional Western medicine. Examples of alternative therapy include acupuncture, acupressure, and yoga. Another common alternative treatment is herbal therapy. Some herbs are believed to relieve headache pain. Always discuss alternative therapies with your caregiver before proceeding. Some herbal products contain arsenic and other toxins. TENSION HEADACHES Q: What is a tension-type headache? What causes it? How can I treat it? A: Tension-type headaches occur randomly. They are often the result of  temporary stress, anxiety, fatigue, or anger. Symptoms include soreness in your temples, a tightening band-like sensation around your head (a "vice-like" ache). Symptoms can also include a pulling feeling, pressure sensations, and contracting head and neck muscles. The headache begins in your forehead, temples, or the back of your head and neck. Treatment for tension-type headache may include over-the-counter or prescription medications. Treatment may also include self-help techniques such as relaxation training and biofeedback. CLUSTER HEADACHES Q: What is a cluster headache? What causes it? How can I treat it? A: Cluster headache gets its name because the attacks come in groups. The pain arrives with little, if any, warning. It is usually on one side of the head. A tearing or bloodshot eye and a runny nose on the same side of the headache may also accompany the pain. Cluster headaches are believed to be caused by chemical reactions in the brain. They have been described as the most severe and intense of any headache type. Treatment for cluster headache includes prescription medication and oxygen. SINUS HEADACHES Q: What is a sinus headache? What causes it? How can I treat it? A: When a cavity in the bones of the face and skull (a sinus) becomes inflamed, the inflammation will cause localized pain. This condition is usually the result of an allergic reaction, a tumor, or an infection. If your headache is caused by a sinus blockage, such as an infection, you will probably have a fever. An x-ray will confirm a sinus blockage. Your caregiver's treatment might include antibiotics for the infection, as well as antihistamines or decongestants.  REBOUND HEADACHES Q:  What is a rebound headache? What causes it? How can I treat it? A: A pattern of taking acute headache medications too often can lead to a condition known as "rebound headache." A pattern of taking too much headache medication includes taking it more  than 2 days per week or in excessive amounts. That means more than the label or a caregiver advises. With rebound headaches, your medications not only stop relieving pain, they actually begin to cause headaches. Doctors treat rebound headache by tapering the medication that is being overused. Sometimes your caregiver will gradually substitute a different type of treatment or medication. Stopping may be a challenge. Regularly overusing a medication increases the potential for serious side effects. Consult a caregiver if you regularly use headache medications more than 2 days per week or more than the label advises. ADDITIONAL QUESTIONS AND ANSWERS Q: What is biofeedback? A: Biofeedback is a self-help treatment. Biofeedback uses special equipment to monitor your body's involuntary physical responses. Biofeedback monitors:  Breathing.   Pulse.   Heart rate.   Temperature.   Muscle tension.   Brain activity.  Biofeedback helps you refine and perfect your relaxation exercises. You learn to control the physical responses that are related to stress. Once the technique has been mastered, you do not need the equipment any more. Q: Are headaches hereditary? A: Four out of five (80%) of people that suffer report a family history of migraine. Scientists are not sure if this is genetic or a family predisposition. Despite the uncertainty, a child has a 50% chance of having migraine if one parent suffers. The child has a 75% chance if both parents suffer.  Q: Can children get headaches? A: By the time they reach high school, most young people have experienced some type of headache. Many safe and effective approaches or medications can prevent a headache from occurring or stop it after it has begun.  Q: What type of doctor should I see to diagnose and treat my headache? A: Start with your primary caregiver. Discuss his or her experience and approach to headaches. Discuss methods of classification, diagnosis,  and treatment. Your caregiver may decide to recommend you to a headache specialist, depending upon your symptoms or other physical conditions. Having diabetes, allergies, etc., may require a more comprehensive and inclusive approach to your headache. The National Head[employer] Foundation will provide, upon request, a list of [facility] physician members in your state. Document Released: 01/06/2004 Document Revised: 10/05/2011 Document Reviewed: 06/15/2008 [facility] Patient Information 2012 [place], [place].

## 2012-01-25 ENCOUNTER — Encounter (HOSPITAL_COMMUNITY): Payer: Self-pay | Admitting: Physical Medicine and Rehabilitation

## 2012-01-25 ENCOUNTER — Emergency Department (HOSPITAL_COMMUNITY)
Admission: EM | Admit: 2012-01-25 | Discharge: 2012-01-25 | Disposition: A | Discharge status: Home / Self Care | Payer: Non-veteran care | Attending: Emergency Medicine | Admitting: Emergency Medicine

## 2012-01-25 DIAGNOSIS — R110 Nausea: Secondary | ICD-10-CM | POA: Insufficient documentation

## 2012-01-25 DIAGNOSIS — R51 Headache: Secondary | ICD-10-CM | POA: Insufficient documentation

## 2012-01-25 MED ORDER — HYDROMORPHONE HCL PF 1 MG/ML IJ SOLN
0.5000 mg | Freq: Once | INTRAMUSCULAR | Status: AC
  Administered 2012-01-25: 0.5 mg via INTRAVENOUS
  Filled 2012-01-25: qty 1

## 2012-01-25 MED ORDER — PROMETHAZINE HCL 25 MG/ML IJ SOLN
25.0000 mg | Freq: Once | INTRAMUSCULAR | Status: AC
  Administered 2012-01-25: 25 mg via INTRAVENOUS
  Filled 2012-01-25: qty 1

## 2012-01-25 MED ORDER — PROMETHAZINE HCL 25 MG PO TABS: 25.0000 mg | ORAL_TABLET | Freq: Three times a day (TID) | ORAL | Status: DC | PRN

## 2012-01-25 MED ORDER — SODIUM CHLORIDE 0.9 % IV BOLUS (SEPSIS)
1000.0000 mL | Freq: Once | INTRAVENOUS | Status: AC
  Administered 2012-01-25: 1000 mL via INTRAVENOUS

## 2012-01-25 MED ORDER — IBUPROFEN 800 MG PO TABS: 800.0000 mg | ORAL_TABLET | Freq: Three times a day (TID) | ORAL | Status: AC | PRN

## 2012-01-25 MED ORDER — DIPHENHYDRAMINE HCL 50 MG/ML IJ SOLN
25.0000 mg | Freq: Once | INTRAMUSCULAR | Status: AC
  Administered 2012-01-25: 25 mg via INTRAVENOUS
  Filled 2012-01-25: qty 1

## 2012-01-25 MED ORDER — SODIUM CHLORIDE 0.9 % IV SOLN
INTRAVENOUS | Status: DC
  Administered 2012-01-25: 15:00:00 via INTRAVENOUS

## 2012-01-25 MED ORDER — METHYLPREDNISOLONE SODIUM SUCC 125 MG IJ SOLR
125.0000 mg | Freq: Once | INTRAMUSCULAR | Status: AC
  Administered 2012-01-25: 125 mg via INTRAVENOUS
  Filled 2012-01-25: qty 2

## 2012-01-25 NOTE — ED Provider Notes (Addendum)
History     CSN: [id number]  Arrival date & time 01/25/12  1348   First MD Initiated Contact with Patient 01/25/12 1355      Chief Complaint  Patient presents with  . Headache    (Consider location/radiation/quality/duration/timing/severity/associated sxs/prior treatment) HPI Comments: The patient is a 51 year old male with a history of migraine headache for her which is current headache is typical he tells me. He reports gradual onset yesterday evening of his headache while he was watching television. He reports a headache persisted all night, gradually worsening and this morning he took an ambulance into the emergency room for evaluation and treatment. He appears to be in no distress, awake, alert, and oriented to person, place, time, and event, with no focal neurologic deficits and normal speech.  Patient is a 51 y.o. male presenting with headaches. The history is provided by the patient.  Headache  This is a recurrent problem. The current episode started yesterday (last night). The problem occurs constantly. The problem has been gradually worsening. The headache is associated with nothing. The pain is located in the bilateral, frontal and occipital region. The quality of the pain is described as dull (Typical for his migraine). The pain is at a severity of 8/10. The pain does not radiate. Associated symptoms include nausea. Pertinent negatives include no anorexia, no fever, no malaise/fatigue, no chest pressure, no near-syncope, no orthopnea, no palpitations, no syncope, no shortness of breath and no vomiting. He has tried nothing for the symptoms.    Past Medical History  Diagnosis Date  . Migraines   . Hypertension     No past surgical history on file.  History reviewed. No pertinent family history.  History  Substance Use Topics  . Smoking status: Current Everyday Smoker    Types: Cigarettes  . Smokeless tobacco: Not on file  . Alcohol Use: No      Review of Systems    Constitutional: Negative for fever, chills, malaise/fatigue, diaphoresis, activity change, appetite change, fatigue and unexpected weight change.  HENT: Negative for hearing loss, ear pain, nosebleeds, congestion, sore throat, facial swelling, rhinorrhea, sneezing, mouth sores, trouble swallowing, neck pain, neck stiffness, dental problem, postnasal drip, sinus pressure, tinnitus and ear discharge.   Eyes: Negative for photophobia, pain, discharge, redness, itching and visual disturbance.  Respiratory: Negative for cough, chest tightness and shortness of breath.   Cardiovascular: Negative for chest pain, palpitations, orthopnea, syncope and near-syncope.  Gastrointestinal: Positive for nausea. Negative for vomiting, abdominal pain, diarrhea and anorexia.  Genitourinary: Negative.   Musculoskeletal: Negative for myalgias and back pain.  Skin: Negative for color change, pallor, rash and wound.  Neurological: Positive for headaches. Negative for dizziness, tremors, seizures, syncope, facial asymmetry, speech difficulty, weakness, light-headedness and numbness.  Hematological: Negative for adenopathy. Does not bruise/bleed easily.  Psychiatric/Behavioral: Negative.     Allergies  Review of patient's allergies indicates no known allergies.  Home Medications   Current Outpatient Rx  Name Route Sig Dispense Refill  . ATENOLOL 50 MG PO TABS Oral Take 50 mg by mouth daily.    Marland Kitchen FLUTICASONE PROPIONATE 50 MCG/ACT NA SUSP Nasal Place 2 sprays into the nose daily.    Marland Kitchen PANTOPRAZOLE SODIUM 40 MG PO TBEC Oral Take 40 mg by mouth daily.      BP 155/98  Pulse 64  Temp 98.2 F (36.8 C)  Resp 20  SpO2 95%  Physical Exam  Nursing note and vitals reviewed. Constitutional: He is oriented to person, place, and time.  He appears well-developed and well-nourished. No distress.  HENT:  Head: Normocephalic and atraumatic. Head is without raccoon's eyes, without Battle's sign, without abrasion, without  contusion, without right periorbital erythema and without left periorbital erythema.  Right Ear: Hearing, tympanic membrane, external ear and ear canal normal.  Left Ear: Hearing, tympanic membrane, external ear and ear canal normal.  Nose: Nose normal. No mucosal edema, rhinorrhea, sinus tenderness, nasal deformity, septal deviation or nasal septal hematoma. No epistaxis. Right sinus exhibits no maxillary sinus tenderness and no frontal sinus tenderness. Left sinus exhibits no maxillary sinus tenderness and no frontal sinus tenderness.  Mouth/Throat: Uvula is midline, oropharynx is clear and moist and mucous membranes are normal. No oropharyngeal exudate.  Eyes: Conjunctivae, EOM and lids are normal. Pupils are equal, round, and reactive to light. Right eye exhibits no chemosis, no discharge, no exudate and no hordeolum. No foreign body present in the right eye. Left eye exhibits no chemosis, no discharge, no exudate and no hordeolum. No foreign body present in the left eye. Right conjunctiva is not injected. Right conjunctiva has no hemorrhage. Left conjunctiva is not injected. Left conjunctiva has no hemorrhage. Right eye exhibits normal extraocular motion and no nystagmus. Left eye exhibits normal extraocular motion and no nystagmus.  Fundoscopic exam:      The right eye shows no AV nicking, no exudate, no hemorrhage and no papilledema.       The left eye shows no AV nicking, no exudate, no hemorrhage and no papilledema.  Neck: Trachea normal, normal range of motion, full passive range of motion without pain and phonation normal. Neck supple. No JVD present. No tracheal tenderness, no spinous process tenderness and no muscular tenderness present. Carotid bruit is not present. No rigidity. No tracheal deviation, no edema, no erythema and normal range of motion present. No Brudzinski's sign noted. No mass and no thyromegaly present.  Cardiovascular: Normal rate, regular rhythm, normal heart sounds and  intact distal pulses.  Exam reveals no gallop and no friction rub.   No murmur heard. Pulmonary/Chest: Effort normal and breath sounds normal. No stridor. No respiratory distress. He has no wheezes. He has no rales.  Abdominal: Soft. Bowel sounds are normal. He exhibits no distension and no mass. There is no tenderness. There is no rebound and no guarding.  Musculoskeletal: Normal range of motion. He exhibits no edema and no tenderness.  Neurological: He is alert and oriented to person, place, and time. He has normal reflexes. He displays no atrophy, no tremor and normal reflexes. No cranial nerve deficit or sensory deficit. He exhibits normal muscle tone. He displays a negative Romberg sign. He displays no seizure activity. Coordination and gait normal. GCS eye subscore is 4. GCS verbal subscore is 5. GCS motor subscore is 6.  Reflex Scores:      Tricep reflexes are 2+ on the right side and 2+ on the left side.      Bicep reflexes are 2+ on the right side and 2+ on the left side.      Brachioradialis reflexes are 2+ on the right side and 2+ on the left side.      Patellar reflexes are 2+ on the right side and 2+ on the left side.      Achilles reflexes are 2+ on the right side and 2+ on the left side.      Normal visual fields to confrontation, normal coordination by finger-nose-finger and heel-to-shin test, normal gait, no cerebellar signs, nonfocal neurologic exam.  Skin: Skin is warm and dry. No rash noted. He is not diaphoretic. No erythema. No pallor.  Psychiatric: He has a normal mood and affect. His behavior is normal. Judgment and thought content normal.    ED Course  Procedures (including critical care time)  Labs Reviewed - No data to display No results found.   No diagnosis found.    MDM  The patient has a history of migraine headaches, states this one is similar to his previous migraine headaches, he denies any recent head injury, he denies any concerning associated  symptoms, and his neurologic exam is nonfocal. I will treat the patient for his symptoms and discharge him home. I do not feel further diagnostic studies are warranted.       Felisa Bonier, MD 01/25/12 1409  3:10 PM Patient's headache relieved.  Felisa Bonier, MD 01/25/12 [phone]

## 2012-01-25 NOTE — Discharge Instructions (Signed)
Headaches, Frequently Asked Questions MIGRAINE HEADACHES Q: What is migraine? What causes it? How can I treat it? A: Generally, migraine headaches begin as a dull ache. Then they develop into a constant, throbbing, and pulsating pain. You may experience pain at the temples. You may experience pain at the front or back of one or both sides of the head. The pain is usually accompanied by a combination of:  Nausea.   Vomiting.   Sensitivity to light and noise.  Some people (about 15%) experience an aura (see below) before an attack. The cause of migraine is believed to be chemical reactions in the brain. Treatment for migraine may include over-the-counter or prescription medications. It may also include self-help techniques. These include relaxation training and biofeedback.  Q: What is an aura? A: About 15% of people with migraine get an "aura". This is a sign of neurological symptoms that occur before a migraine headache. You may see wavy or jagged lines, dots, or flashing lights. You might experience tunnel vision or blind spots in one or both eyes. The aura can include visual or auditory hallucinations (something imagined). It may include disruptions in smell (such as strange odors), taste or touch. Other symptoms include:  Numbness.   A "pins and needles" sensation.   Difficulty in recalling or speaking the correct word.  These neurological events may last as long as 60 minutes. These symptoms will fade as the headache begins. Q: What is a trigger? A: Certain physical or environmental factors can lead to or "trigger" a migraine. These include:  Foods.   Hormonal changes.   Weather.   Stress.  It is important to remember that triggers are different for everyone. To help prevent migraine attacks, you need to figure out which triggers affect you. Keep a headache diary. This is a good way to track triggers. The diary will help you talk to your healthcare professional about your  condition. Q: Does weather affect migraines? A: Bright sunshine, hot, humid conditions, and drastic changes in barometric pressure may lead to, or "trigger," a migraine attack in some people. But studies have shown that weather does not act as a trigger for everyone with migraines. Q: What is the link between migraine and hormones? A: Hormones start and regulate many of your body's functions. Hormones keep your body in balance within a constantly changing environment. The levels of hormones in your body are unbalanced at times. Examples are during menstruation, pregnancy, or menopause. That can lead to a migraine attack. In fact, about three quarters of all women with migraine report that their attacks are related to the menstrual cycle.  Q: Is there an increased risk of stroke for migraine sufferers? A: The likelihood of a migraine attack causing a stroke is very remote. That is not to say that migraine sufferers cannot have a stroke associated with their migraines. In persons under age 40, the most common associated factor for stroke is migraine headache. But over the course of a person's normal life span, the occurrence of migraine headache may actually be associated with a reduced risk of dying from cerebrovascular disease due to stroke.  Q: What are acute medications for migraine? A: Acute medications are used to treat the pain of the headache after it has started. Examples over-the-counter medications, NSAIDs, ergots, and triptans.  Q: What are the triptans? A: Triptans are the newest class of abortive medications. They are specifically targeted to treat migraine. Triptans are vasoconstrictors. They moderate some chemical reactions in the brain.   The triptans work on receptors in your brain. Triptans help to restore the balance of a neurotransmitter called serotonin. Fluctuations in levels of serotonin are thought to be a main cause of migraine.  Q: Are over-the-counter medications for migraine  effective? A: Over-the-counter, or "OTC," medications may be effective in relieving mild to moderate pain and associated symptoms of migraine. But you should see your caregiver before beginning any treatment regimen for migraine.  Q: What are preventive medications for migraine? A: Preventive medications for migraine are sometimes referred to as "prophylactic" treatments. They are used to reduce the frequency, severity, and length of migraine attacks. Examples of preventive medications include antiepileptic medications, antidepressants, beta-blockers, calcium channel blockers, and NSAIDs (nonsteroidal anti-inflammatory drugs). Q: Why are anticonvulsants used to treat migraine? A: During the past few years, there has been an increased interest in antiepileptic drugs for the prevention of migraine. They are sometimes referred to as "anticonvulsants". Both epilepsy and migraine may be caused by similar reactions in the brain.  Q: Why are antidepressants used to treat migraine? A: Antidepressants are typically used to treat people with depression. They may reduce migraine frequency by regulating chemical levels, such as serotonin, in the brain.  Q: What alternative therapies are used to treat migraine? A: The term "alternative therapies" is often used to describe treatments considered outside the scope of conventional Western medicine. Examples of alternative therapy include acupuncture, acupressure, and yoga. Another common alternative treatment is herbal therapy. Some herbs are believed to relieve headache pain. Always discuss alternative therapies with your caregiver before proceeding. Some herbal products contain arsenic and other toxins. TENSION HEADACHES Q: What is a tension-type headache? What causes it? How can I treat it? A: Tension-type headaches occur randomly. They are often the result of temporary stress, anxiety, fatigue, or anger. Symptoms include soreness in your temples, a tightening  band-like sensation around your head (a "vice-like" ache). Symptoms can also include a pulling feeling, pressure sensations, and contracting head and neck muscles. The headache begins in your forehead, temples, or the back of your head and neck. Treatment for tension-type headache may include over-the-counter or prescription medications. Treatment may also include self-help techniques such as relaxation training and biofeedback. CLUSTER HEADACHES Q: What is a cluster headache? What causes it? How can I treat it? A: Cluster headache gets its name because the attacks come in groups. The pain arrives with little, if any, warning. It is usually on one side of the head. A tearing or bloodshot eye and a runny nose on the same side of the headache may also accompany the pain. Cluster headaches are believed to be caused by chemical reactions in the brain. They have been described as the most severe and intense of any headache type. Treatment for cluster headache includes prescription medication and oxygen. SINUS HEADACHES Q: What is a sinus headache? What causes it? How can I treat it? A: When a cavity in the bones of the face and skull (a sinus) becomes inflamed, the inflammation will cause localized pain. This condition is usually the result of an allergic reaction, a tumor, or an infection. If your headache is caused by a sinus blockage, such as an infection, you will probably have a fever. An x-ray will confirm a sinus blockage. Your caregiver's treatment might include antibiotics for the infection, as well as antihistamines or decongestants.  REBOUND HEADACHES Q: What is a rebound headache? What causes it? How can I treat it? A: A pattern of taking acute headache medications too   often can lead to a condition known as "rebound headache." A pattern of taking too much headache medication includes taking it more than 2 days per week or in excessive amounts. That means more than the label or a caregiver advises.  With rebound headaches, your medications not only stop relieving pain, they actually begin to cause headaches. Doctors treat rebound headache by tapering the medication that is being overused. Sometimes your caregiver will gradually substitute a different type of treatment or medication. Stopping may be a challenge. Regularly overusing a medication increases the potential for serious side effects. Consult a caregiver if you regularly use headache medications more than 2 days per week or more than the label advises. ADDITIONAL QUESTIONS AND ANSWERS Q: What is biofeedback? A: Biofeedback is a self-help treatment. Biofeedback uses special equipment to monitor your body's involuntary physical responses. Biofeedback monitors:  Breathing.   Pulse.   Heart rate.   Temperature.   Muscle tension.   Brain activity.  Biofeedback helps you refine and perfect your relaxation exercises. You learn to control the physical responses that are related to stress. Once the technique has been mastered, you do not need the equipment any more. Q: Are headaches hereditary? A: Four out of five (80%) of people that suffer report a family history of migraine. Scientists are not sure if this is genetic or a family predisposition. Despite the uncertainty, a child has a 50% chance of having migraine if one parent suffers. The child has a 75% chance if both parents suffer.  Q: Can children get headaches? A: By the time they reach high school, most young people have experienced some type of headache. Many safe and effective approaches or medications can prevent a headache from occurring or stop it after it has begun.  Q: What type of doctor should I see to diagnose and treat my headache? A: Start with your primary caregiver. Discuss his or her experience and approach to headaches. Discuss methods of classification, diagnosis, and treatment. Your caregiver may decide to recommend you to a headache specialist, depending upon  your symptoms or other physical conditions. Having diabetes, allergies, etc., may require a more comprehensive and inclusive approach to your headache. The National Headache Foundation will provide, upon request, a list of NHF physician members in your state. Document Released: 01/06/2004 Document Revised: 10/05/2011 Document Reviewed: 06/15/2008 [employer] Patient Information 2012 [employer].Headache, General, Unknown Cause The specific cause of your headache may not have been found today. There are many causes and types of headache. A few common ones are:  Tension headache.   Migraine.   Infections (examples: dental and sinus infections).   Bone and/or joint problems in the neck or jaw.   Depression.   Eye problems.  These headaches are not life threatening.  Headaches can sometimes be diagnosed by a patient history and a physical exam. Sometimes, lab and imaging studies (such as x-ray and/or CT scan) are used to rule out more serious problems. In some cases, a spinal tap (lumbar puncture) may be requested. There are many times when your exam and tests may be normal on the first visit even when there is a serious problem causing your headaches. Because of that, it is very important to follow up with your doctor or local clinic for further evaluation. FINDING OUT THE RESULTS OF TESTS  If a radiology test was performed, a radiologist will review your results.   You will be contacted by the emergency department or your physician if any test results   require a change in your treatment plan.   Not all test results may be available during your visit. If your test results are not back during the visit, make an appointment with your caregiver to find out the results. Do not assume everything is normal if you have not heard from your caregiver or the medical facility. It is important for you to follow up on all of your test results.  HOME CARE INSTRUCTIONS   Keep follow-up appointments with  your caregiver, or any specialist referral.   Only take over-the-counter or prescription medicines for pain, discomfort, or fever as directed by your caregiver.   Biofeedback, massage, or other relaxation techniques may be helpful.   Ice packs or heat applied to the head and neck can be used. Do this three to four times per day, or as needed.   Call your doctor if you have any questions or concerns.   If you smoke, you should quit.  SEEK MEDICAL CARE IF:   You develop problems with medications prescribed.   You do not respond to or obtain relief from medications.   You have a change from the usual headache.   You develop nausea or vomiting.  SEEK IMMEDIATE MEDICAL CARE IF:   If your headache becomes severe.   You have an unexplained oral temperature above 102 F (38.9 C), or as your caregiver suggests.   You have a stiff neck.   You have loss of vision.   You have muscular weakness.   You have loss of muscular control.   You develop severe symptoms different from your first symptoms.   You start losing your balance or have trouble walking.   You feel faint or pass out.  MAKE SURE YOU:   Understand these instructions.   Will watch your condition.   Will get help right away if you are not doing well or get worse.  Document Released: 10/16/2005 Document Revised: 10/05/2011 Document Reviewed: 06/04/2008 [facility] Patient Information 2012 [place], [place].  RESOURCE GUIDE  Dental Problems  Patients with Medicaid: [facility] [phone] [address]                                           [phone] [address] Phone:  [phone]                                                  Phone:  [phone]  If unable to pay or uninsured, contact:  [facility] or [facility]. to become qualified for the adult dental clinic.  Chronic Pain Problems Contact [facility]  [phone] Patients need to  be referred by their primary care doctor.  Insufficient Money for Medicine Contact [employer]:  call "211" or [facility] (939)148-3807.  No Primary Care Doctor Call [facility]  705-544-5218 Other agencies that provide inexpensive medical care    [facility]  [phone]    [facility]  [phone]    [facility]  6697498145    [facility]  [phone]    [facility]  [phone]    [facility]  [phone]  Psychological Services [facility]  [phone] [facility]  [phone] [facility]   [phone] (emergency services [phone])  Substance Abuse Resources [facility]  4404065057 [facility] [phone] [facility] [place] [phone] Floydene Flock [phone] Residential & Outpatient Substance Abuse Program  [phone]  Abuse/Neglect [facility] Child Abuse Hotline 534-073-8713 [facility] Child Abuse Hotline 251-606-9195 (After Hours)  Emergency Shelter [facility] 2041364864  M[facility] Homes [facility] [place] [facility] [phone])532-5756 [facility] (680)[phone]  MRSA Hotline #:   (262) 853-7165    [facility] Resources  [facility] [place] [facility]     [employer]                          [facility] Dept. [address] [place]                       [address]      [address]  Blondell Reveal Phone:  [phone]                                   Phone:  [phone]                 Phone:  [phone]  [facility] Phone:  [phone]  [facility] Child Abuse Hotline (805)525-1671 [phone] (After Hours)

## 2012-01-25 NOTE — ED Notes (Signed)
Pt presents to department via [facility] for evaluation of headache. Onset last night. Also states nausea/vomiting. He is conscious alert and oriented x4. 8/10 pain upon arrival to ED.

## 2012-01-25 NOTE — ED Notes (Signed)
Onset one day ago headache throbbing pain worsening today. States history of migraines in the past last being one months ago.  Airway intact bilateral equal chest rise and fall.  Clear speech answering and following commands appropriate. Pain 7-10/10 throbbing.

## 2012-01-26 ENCOUNTER — Encounter (HOSPITAL_COMMUNITY): Payer: Self-pay | Admitting: Emergency Medicine

## 2012-01-26 ENCOUNTER — Emergency Department (HOSPITAL_COMMUNITY)
Admission: EM | Admit: 2012-01-26 | Discharge: 2012-01-26 | Disposition: A | Discharge status: Home / Self Care | Payer: Non-veteran care | Attending: Emergency Medicine | Admitting: Emergency Medicine

## 2012-01-26 DIAGNOSIS — R51 Headache: Secondary | ICD-10-CM | POA: Insufficient documentation

## 2012-01-26 DIAGNOSIS — R110 Nausea: Secondary | ICD-10-CM | POA: Insufficient documentation

## 2012-01-26 MED ORDER — DIPHENHYDRAMINE HCL 25 MG PO CAPS
25.0000 mg | ORAL_CAPSULE | Freq: Once | ORAL | Status: AC
  Administered 2012-01-26: 25 mg via ORAL
  Filled 2012-01-26: qty 1

## 2012-01-26 MED ORDER — IBUPROFEN 800 MG PO TABS
800.0000 mg | ORAL_TABLET | Freq: Once | ORAL | Status: AC
  Administered 2012-01-26: 800 mg via ORAL
  Filled 2012-01-26: qty 1

## 2012-01-26 MED ORDER — DIPHENHYDRAMINE HCL 25 MG PO CAPS
25.0000 mg | ORAL_CAPSULE | Freq: Once | ORAL | Status: AC
  Administered 2012-01-26: 25 mg via ORAL

## 2012-01-26 MED ORDER — DIPHENHYDRAMINE HCL 25 MG PO CAPS
ORAL_CAPSULE | ORAL | Status: AC
  Filled 2012-01-26: qty 1

## 2012-01-26 MED ORDER — METOCLOPRAMIDE HCL 10 MG PO TABS
10.0000 mg | ORAL_TABLET | Freq: Once | ORAL | Status: AC
  Administered 2012-01-26: 10 mg via ORAL
  Filled 2012-01-26: qty 1

## 2012-01-26 NOTE — ED Notes (Signed)
PT. REPORTS HEADACHE FOR SEVERAL DAYS SEEN HERE TODAY BUT DID NOT FILL PRESCRIPTION .

## 2012-01-26 NOTE — Discharge Instructions (Signed)
Headache, General, Unknown Cause  The specific cause of your headache may not have been found today. There are many causes and types of headache. A few common ones are:  Tension headache.  Migraine.  Infections (examples: dental and sinus infections).  Bone and/or joint problems in the neck or jaw.  Depression.  Eye problems.  These headaches are not life threatening.  Headaches can sometimes be diagnosed by a patient history and a physical exam. Sometimes, lab and imaging studies (such as x-ray and/or CT scan) are used to rule out more serious problems. In some cases, a spinal tap (lumbar puncture) may be requested. There are many times when your exam and tests may be normal on the first visit even when there is a serious problem causing your headaches. Because of that, it is very important to follow up with your doctor or local clinic for further evaluation.  FINDING OUT THE RESULTS OF TESTS  If a radiology test was performed, a radiologist will review your results.  You will be contacted by the emergency department or your physician if any test results require a change in your treatment plan.  Not all test results may be available during your visit. If your test results are not back during the visit, make an appointment with your caregiver to find out the results. Do not assume everything is normal if you have not heard from your caregiver or the medical facility. It is important for you to follow up on all of your test results.  HOME CARE INSTRUCTIONS  Keep follow-up appointments with your caregiver, or any specialist referral.  Only take over-the-counter or prescription medicines for pain, discomfort, or fever as directed by your caregiver.  Biofeedback, massage, or other relaxation techniques may be helpful.  Ice packs or heat applied to the head and neck can be used. Do this three to four times per day, or as needed.  Call your doctor if you have any questions or concerns.  If you smoke,  you should quit.  SEEK MEDICAL CARE IF:  You develop problems with medications prescribed.  You do not respond to or obtain relief from medications.  You have a change from the usual headache.  You develop nausea or vomiting.  SEEK IMMEDIATE MEDICAL CARE IF:  If your headache becomes severe.  You have an unexplained oral temperature above 102 F (38.9 C), or as your caregiver suggests.  You have a stiff neck.  You have loss of vision.  You have muscular weakness.  You have loss of muscular control.  You develop severe symptoms different from your first symptoms.  You start losing your balance or have trouble walking.  You feel faint or pass out.  MAKE SURE YOU:  Understand these instructions.  Will watch your condition.  Will get help right away if you are not doing well or get worse.

## 2012-01-26 NOTE — ED Notes (Addendum)
Pt stated that he had a migraine yesterday and came to the ED. The medicine given at the ED worked, but he could not afford his d/c medication so he did not take them. Now he has a migraine again. No blurred vision. Sensitivity to light. Will continue to monitor.

## 2012-01-26 NOTE — ED Provider Notes (Signed)
History     CSN: [id number]  Arrival date & time 01/26/12  0301   First MD Initiated Contact with Patient 01/26/12 [phone]      Chief Complaint  Patient presents with  . Headache    (Consider location/radiation/quality/duration/timing/severity/associated sxs/prior treatment) Patient is a 51 y.o. male presenting with headaches. The history is provided by the patient.  Headache  This is a recurrent problem. The current episode started 6 to 12 hours ago. The problem occurs constantly. The problem has not changed since onset.The headache is associated with nothing. The pain is located in the bilateral region. The quality of the pain is described as throbbing. The pain is moderate. The pain does not radiate. Associated symptoms include nausea. Pertinent negatives include no fever, no chest pressure, no palpitations, no syncope, no shortness of breath and no vomiting. He has tried nothing for the symptoms. The treatment provided no relief.   patient states he has history of migraines all his life. He is followed by the [facility]. He has ran out of medications normally takes Imitrex. He has scheduled followup on Monday declines any prescriptions from me, as he plans to get them filled at that time.  He was evaluated here yesterday for headache and received a headache cocktail. Pain improved at that time and then return today. He was given a prescription for Motrin but states he does not get paid until next week and cannot afford it. He called 911 to be transported to the emergency department. No weakness or numbness. No difficulty with speech or gait. No recent illness. No neck stiffness. Headache feels similar to his chronic migraines. No new symptoms. No sudden onset or thunderclap headache.  Past Medical History  Diagnosis Date  . Migraines   . Hypertension     History reviewed. No pertinent past surgical history.  No family history on file.  History  Substance Use Topics  . Smoking  status: Current Everyday Smoker    Types: Cigarettes  . Smokeless tobacco: Not on file  . Alcohol Use: No      Review of Systems  Constitutional: Negative for fever and chills.  HENT: Negative for neck pain and neck stiffness.   Eyes: Negative for pain.  Respiratory: Negative for shortness of breath.   Cardiovascular: Negative for chest pain, palpitations and syncope.  Gastrointestinal: Positive for nausea. Negative for vomiting and abdominal pain.  Genitourinary: Negative for dysuria.  Musculoskeletal: Negative for back pain.  Skin: Negative for rash.  Neurological: Positive for headaches.  All other systems reviewed and are negative.    Allergies  Review of patient's allergies indicates no known allergies.  Home Medications   Current Outpatient Rx  Name Route Sig Dispense Refill  . ATENOLOL 50 MG PO TABS Oral Take 50 mg by mouth daily.    Marland Kitchen FLUTICASONE PROPIONATE 50 MCG/ACT NA SUSP Nasal Place 2 sprays into the nose daily.    . IBUPROFEN 800 MG PO TABS Oral Take 1 tablet (800 mg total) by mouth every 8 (eight) hours as needed for pain (headache). 21 tablet 0  . PANTOPRAZOLE SODIUM 40 MG PO TBEC Oral Take 40 mg by mouth daily.      BP 127/84  Pulse 62  Temp(Src) 98.1 F (36.7 C) (Oral)  Resp 18  SpO2 98%  Physical Exam  Constitutional: He is oriented to person, place, and time. He appears well-developed and well-nourished.  HENT:  Head: Normocephalic and atraumatic.  Eyes: Conjunctivae and EOM are normal. Pupils are  equal, round, and reactive to light.  Neck: Full passive range of motion without pain. Neck supple. No thyromegaly present.       No meningismus  Cardiovascular: Normal rate, regular rhythm, S1 normal, S2 normal and intact distal pulses.   Pulmonary/Chest: Effort normal and breath sounds normal.  Abdominal: Soft. Bowel sounds are normal. There is no tenderness. There is no CVA tenderness.  Musculoskeletal: Normal range of motion.  Neurological: He is  alert and oriented to person, place, and time. He has normal strength and normal reflexes. No cranial nerve deficit or sensory deficit. He displays a negative Romberg sign. GCS eye subscore is 4. GCS verbal subscore is 5. GCS motor subscore is 6.       Normal Gait  Skin: Skin is warm and dry. No rash noted. No cyanosis. Nails show no clubbing.  Psychiatric: He has a normal mood and affect. His speech is normal and behavior is normal.    ED Course  Procedures (including critical care time)  Headache cocktail provided by mouth.  Check at 6:40 AM patient is in much better and requesting to be discharged home.   MDM   Headache with history of migraines and presentation consistent with the same.    Patient has a prescription for Motrin and states she will get filled when he gets paid. He has no neuro deficits or indication for emergent CT scan or imaging. Headache completely resolves medications.  Patient agrees to keep his scheduled followup appointment with his doctor at the [facility] on Monday.          Sunnie Nielsen, MD 01/26/12 [phone]

## 2012-04-15 ENCOUNTER — Emergency Department (HOSPITAL_COMMUNITY)
Admission: EM | Admit: 2012-04-15 | Discharge: 2012-04-15 | Disposition: A | Discharge status: Home / Self Care | Payer: Non-veteran care | Attending: Emergency Medicine | Admitting: Emergency Medicine

## 2012-04-15 ENCOUNTER — Encounter (HOSPITAL_COMMUNITY): Payer: Self-pay | Admitting: Emergency Medicine

## 2012-04-15 DIAGNOSIS — R51 Headache: Secondary | ICD-10-CM

## 2012-04-15 DIAGNOSIS — I10 Essential (primary) hypertension: Secondary | ICD-10-CM | POA: Insufficient documentation

## 2012-04-15 DIAGNOSIS — F17200 Nicotine dependence, unspecified, uncomplicated: Secondary | ICD-10-CM | POA: Insufficient documentation

## 2012-04-15 MED ORDER — KETOROLAC TROMETHAMINE 60 MG/2ML IM SOLN
60.0000 mg | Freq: Once | INTRAMUSCULAR | Status: AC
  Administered 2012-04-15: 60 mg via INTRAMUSCULAR
  Filled 2012-04-15: qty 2

## 2012-04-15 MED ORDER — METOCLOPRAMIDE HCL 5 MG/ML IJ SOLN
10.0000 mg | Freq: Once | INTRAMUSCULAR | Status: AC
  Administered 2012-04-15: 10 mg via INTRAMUSCULAR
  Filled 2012-04-15: qty 2

## 2012-04-15 MED ORDER — HYDROMORPHONE HCL PF 1 MG/ML IJ SOLN
0.5000 mg | Freq: Once | INTRAMUSCULAR | Status: AC
  Administered 2012-04-15: 0.5 mg via INTRAMUSCULAR
  Filled 2012-04-15: qty 1

## 2012-04-15 MED ORDER — DIPHENHYDRAMINE HCL 50 MG/ML IJ SOLN
25.0000 mg | Freq: Once | INTRAMUSCULAR | Status: AC
  Administered 2012-04-15: 25 mg via INTRAMUSCULAR
  Filled 2012-04-15: qty 1

## 2012-04-15 NOTE — ED Provider Notes (Signed)
Medical screening examination/treatment/procedure(s) were performed by non-physician practitioner and as supervising physician I was immediately available for consultation/collaboration.   Nat Christen, MD 04/15/12 [phone]

## 2012-04-15 NOTE — ED Notes (Signed)
Per EMS:  Pt c/o headache since yesterday at 1800 - pt takes imitrex for migraines and has run out of his Rx.

## 2012-04-15 NOTE — ED Provider Notes (Signed)
History     CSN: [id number]  Arrival date & time 04/15/12  0221   First MD Initiated Contact with Patient 04/15/12 0226      Chief Complaint  Patient presents with  . Headache    (Consider location/radiation/quality/duration/timing/severity/associated sxs/prior treatment) Patient is a 51 y.o. male presenting with headaches. The history is provided by the patient.  Headache  This is a new problem. The current episode started 6 to 12 hours ago. The problem occurs constantly. The problem has not changed since onset.The headache is associated with bright light and loud noise. The pain is located in the left unilateral region. The quality of the pain is described as sharp. The pain is at a severity of 8/10. The pain is moderate. The pain does not radiate. Associated symptoms include nausea. Pertinent negatives include no fever and no vomiting.  Pt states headache similar to prior migraines. Ran out of imitrex. Took BC powder with no improvement. Onset gradual. NO fever, neck pain, weakness or numbness of upper or lower extremities. No confusion, memory problems, ataxia. No visual changes.   Past Medical History  Diagnosis Date  . Migraines   . Hypertension     History reviewed. No pertinent past surgical history.  History reviewed. No pertinent family history.  History  Substance Use Topics  . Smoking status: Current Everyday Smoker    Types: Cigarettes  . Smokeless tobacco: Not on file  . Alcohol Use: No      Review of Systems  Constitutional: Negative for fever and chills.  HENT: Negative for congestion, sore throat, neck pain and neck stiffness.   Eyes: Positive for photophobia. Negative for visual disturbance.  Respiratory: Negative.   Cardiovascular: Negative.   Gastrointestinal: Positive for nausea. Negative for vomiting.  Musculoskeletal: Negative for back pain and gait problem.  Neurological: Positive for headaches. Negative for weakness and numbness.     Allergies  Review of patient's allergies indicates no known allergies.  Home Medications   Current Outpatient Rx  Name Route Sig Dispense Refill  . ATENOLOL 50 MG PO TABS Oral Take 50 mg by mouth daily.    Marland Kitchen FLUTICASONE PROPIONATE 50 MCG/ACT NA SUSP Nasal Place 2 sprays into the nose daily.    Marland Kitchen PANTOPRAZOLE SODIUM 40 MG PO TBEC Oral Take 40 mg by mouth daily.    . SUMATRIPTAN SUCCINATE 50 MG PO TABS Oral Take 50 mg by mouth every 2 (two) hours as needed.      BP 137/87  Pulse 60  Temp 97.3 F (36.3 C) (Oral)  Resp 20  SpO2 99%  Physical Exam  Nursing note and vitals reviewed. Constitutional: He is oriented to person, place, and time. He appears well-developed and well-nourished. No distress.  HENT:  Head: Normocephalic and atraumatic.  Right Ear: External ear normal.  Left Ear: External ear normal.  Nose: Nose normal.  Mouth/Throat: Oropharynx is clear and moist.  Eyes: Conjunctivae are normal. Pupils are equal, round, and reactive to light.  Neck: Normal range of motion. Neck supple.  Cardiovascular: Normal rate and regular rhythm.   Pulmonary/Chest: Effort normal and breath sounds normal. No respiratory distress. He has no wheezes. He has no rales.  Musculoskeletal: Normal range of motion. He exhibits no edema.  Neurological: He is alert and oriented to person, place, and time. No cranial nerve deficit. Coordination normal.       5/5 and equal upper and lower extremity strength. No pronator drift. 5/5 and equal grip strength bilaterally  Skin: Skin  is warm and dry.  Psychiatric: He has a normal mood and affect.    ED Course  Procedures (including critical care time)  Pt with headache, gradual onset, no neuro deficits. Similar to prior headaches. Pt states "dilaudid works for me."  I will try migraine cocktail that he has gotten in the past here in ED with success and a low does of dilaudid.    3:38 AM Pt received 60mg  toradol IM, 10mg  reglan IM, 25mg  benadryl   IM, 0.5mg  dilaudid IM. Pt is completely pain free. Ready and stable for d/c. [facility]   Filed Vitals:   04/15/12 0223  BP: 137/87  Pulse: 60  Temp: 97.3 F (36.3 C)  Resp: 20    1. Headache       MDM         Lottie Mussel, PA 04/15/12 [phone]

## 2012-04-15 NOTE — Discharge Instructions (Signed)
Continue all your current medications. Rest. Drink plenty of fluids. [facility].  Return if worsening.   Migraine Headache A migraine headache is an intense, throbbing pain on one or both sides of your head. The exact cause of a migraine headache is not always known. A migraine may be caused when nerves in the brain become irritated and release chemicals that cause swelling within blood vessels, causing pain. Many migraine sufferers have a family history of migraines. Before you get a migraine you may or may not get an aura. An aura is a group of symptoms that can predict the beginning of a migraine. An aura may include:  Visual changes such as:   Flashing lights.   Bright spots or zig-zag lines.   Tunnel vision.   Feelings of numbness.   Trouble talking.   Muscle weakness.  SYMPTOMS  Pain on one or both sides of your head.   Pain that is pulsating or throbbing in nature.   Pain that is severe enough to prevent daily activities.   Pain that is aggravated by any daily physical activity.   Nausea (feeling sick to your stomach), vomiting, or both.   Pain with exposure to bright lights, loud noises, or activity.   General sensitivity to bright lights or loud noises.  MIGRAINE TRIGGERS Examples of triggers of migraine headaches include:   Alcohol.   Smoking.   Stress.   It may be related to menses (male menstruation).   Aged cheeses.   Foods or drinks that contain nitrates, glutamate, aspartame, or tyramine.   Lack of sleep.   Chocolate.   Caffeine.   Hunger.   Medications such as nitroglycerine (used to treat chest pain), birth control pills, estrogen, and some blood pressure medications.  DIAGNOSIS  A migraine headache is often diagnosed based on:  Symptoms.   Physical examination.   A computerized X-ray scan (computed tomography, CT) of your head.  TREATMENT  Medications can help prevent migraines if they are recurrent or  should they become recurrent. Your caregiver can help you with a medication or treatment program that will be helpful to you.   Lying down in a dark, quiet room may be helpful.   Keeping a headache diary may help you find a trend as to what may be triggering your headaches.  SEEK IMMEDIATE MEDICAL CARE IF:   You have confusion, personality changes or seizures.   You have headaches that wake you from sleep.   You have an increased frequency in your headaches.   You have a stiff neck.   You have a loss of vision.   You have muscle weakness.   You start losing your balance or have trouble walking.   You feel faint or pass out.  MAKE SURE YOU:   Understand these instructions.   Will watch your condition.   Will get help right away if you are not doing well or get worse.  Document Released: 10/16/2005 Document Revised: 10/05/2011 Document Reviewed: 06/01/2009 [facility] Patient Information 2012 [place], [place].

## 2013-01-25 ENCOUNTER — Emergency Department (HOSPITAL_COMMUNITY)
Admission: EM | Admit: 2013-01-25 | Discharge: 2013-01-25 | Disposition: A | Discharge status: Home / Self Care | Payer: Self-pay | Attending: Emergency Medicine | Admitting: Emergency Medicine

## 2013-01-25 ENCOUNTER — Encounter (HOSPITAL_COMMUNITY): Payer: Self-pay | Admitting: Emergency Medicine

## 2013-01-25 DIAGNOSIS — F17200 Nicotine dependence, unspecified, uncomplicated: Secondary | ICD-10-CM | POA: Insufficient documentation

## 2013-01-25 DIAGNOSIS — I10 Essential (primary) hypertension: Secondary | ICD-10-CM | POA: Insufficient documentation

## 2013-01-25 DIAGNOSIS — R112 Nausea with vomiting, unspecified: Secondary | ICD-10-CM | POA: Insufficient documentation

## 2013-01-25 DIAGNOSIS — G43909 Migraine, unspecified, not intractable, without status migrainosus: Secondary | ICD-10-CM | POA: Insufficient documentation

## 2013-01-25 DIAGNOSIS — H53149 Visual discomfort, unspecified: Secondary | ICD-10-CM | POA: Insufficient documentation

## 2013-01-25 DIAGNOSIS — Z79899 Other long term (current) drug therapy: Secondary | ICD-10-CM | POA: Insufficient documentation

## 2013-01-25 MED ORDER — DROPERIDOL 2.5 MG/ML IJ SOLN: 2.5000 mg | Freq: Once | INTRAMUSCULAR | Status: DC

## 2013-01-25 MED ORDER — HALOPERIDOL LACTATE 5 MG/ML IJ SOLN
INTRAMUSCULAR | Status: AC
  Filled 2013-01-25: qty 2

## 2013-01-25 MED ORDER — HALOPERIDOL LACTATE 5 MG/ML IJ SOLN
7.0000 mg | Freq: Once | INTRAMUSCULAR | Status: AC
  Administered 2013-01-25: 7 mg via INTRAMUSCULAR

## 2013-01-25 MED ORDER — SUMATRIPTAN SUCCINATE 6 MG/0.5ML ~~LOC~~ SOLN
6.0000 mg | Freq: Once | SUBCUTANEOUS | Status: AC
  Administered 2013-01-25: 6 mg via SUBCUTANEOUS
  Filled 2013-01-25: qty 0.5

## 2013-01-25 NOTE — ED Notes (Signed)
Patient lying in bed with lights off. Headache 8/10. Imitrex injection given. States that it usually does not work well for him. Will monitor.

## 2013-01-25 NOTE — ED Notes (Signed)
Pt c/o migraine onset 1900 last night. +n/v. Took imitrex at home with no relief

## 2013-01-25 NOTE — ED Provider Notes (Signed)
History     CSN: [id number]  Arrival date & time 01/25/13  0435   First MD Initiated Contact with Patient 01/25/13 0444      Chief Complaint  Patient presents with  . Migraine    (Consider location/radiation/quality/duration/timing/severity/associated sxs/prior treatment) HPI Alexander Munoz is a 52 y.o. male presenting with migraine headaches. Patient says this feels like his typical migraines, started at 1900 last night and began gradually, it got worse and worse, 10 out of 10, he tried to take his Imitrex but vomited it up. His headache is throbbing, all over his head, associated nausea vomiting, photophobia.  Denies any numbness, tingling, weakness, dysarthria. Patient is a daily smoker, was also drinking alcohol last night.   Past Medical History  Diagnosis Date  . Migraines   . Hypertension     History reviewed. No pertinent past surgical history.  No family history on file.  History  Substance Use Topics  . Smoking status: Current Every Day Smoker    Types: Cigarettes  . Smokeless tobacco: Not on file  . Alcohol Use: No      Review of Systems At least 10pt or greater review of systems completed and are negative except where specified in the HPI.  Allergies  Review of patient's allergies indicates no known allergies.  Home Medications   Current Outpatient Rx  Name  Route  Sig  Dispense  Refill  . atenolol (TENORMIN) 50 MG tablet   Oral   Take 50 mg by mouth daily.         . pantoprazole (PROTONIX) 40 MG tablet   Oral   Take 40 mg by mouth daily.         . fluticasone (FLONASE) 50 MCG/ACT nasal spray   Nasal   Place 2 sprays into the nose daily.         . SUMAtriptan (IMITREX) 50 MG tablet   Oral   Take 50 mg by mouth every 2 (two) hours as needed. For migraine           BP 141/96  Pulse 65  Temp(Src) 98.1 F (36.7 C) (Oral)  Resp 25  Wt 235 lb (106.595 kg)  SpO2 100%  Physical Exam  Nursing notes reviewed.  Electronic medical  record reviewed. VITAL SIGNS:   Filed Vitals:   01/25/13 0438 01/25/13 0724  BP: 130/74 141/96  Pulse: 83 65  Temp: 97.5 F (36.4 C) 98.1 F (36.7 C)  TempSrc: Oral Oral  Resp: 20 25  Weight: 235 lb (106.595 kg)   SpO2: 100% 100%   CONSTITUTIONAL: Awake, oriented, appears non-toxic HENT: Atraumatic, normocephalic, oral mucosa pink and moist, airway patent. Nares patent without drainage. External ears normal. EYES: Conjunctiva clear, EOMI, PERRLA NECK: Trachea midline, non-tender, supple CARDIOVASCULAR: Normal heart rate, Normal rhythm, No murmurs, rubs, gallops PULMONARY/CHEST: Clear to auscultation, no rhonchi, wheezes, or rales. Symmetrical breath sounds. Non-tender. ABDOMINAL: Non-distended, soft, non-tender - no rebound or guarding.  BS normal. NEUROLOGIC:Facial sensation equal to light touch bilaterally.  Good muscle bulk in the masseter muscle and good lateral movement of the jaw.  Facial expressions equal and good strength with smile/frown and puffed cheeks.  Hearing grossly intact to finger rub test.  Uvula, tongue are midline with no deviation. Symmetrical palate elevation.  Trapezius and SCM muscles are 5/5 strength bilaterally.   Non-focal, moving all four extremities, no gross sensory or motor deficits. EXTREMITIES: No clubbing, cyanosis, or edema SKIN: Warm, Dry, No erythema, No rash  ED Course  Procedures (including critical care time)  Labs Reviewed - No data to display No results found.   1. Migraine       MDM  Alexander Munoz is a 52 y.o. male presenting with typical migraine headache. Patient vomited up his Imitrex - unsure, she patient actually got, we'll attempt a dopaminergic agent instead, haldol also milligrams IM. Roe Coombs' thtink this is a SAH, afebrile, non-toxic, no h/o c/w CNS infection.  No confusion. Doubt mass lesion or CVT.  01/25/2013 7:31 AM  Pt headache is back, requesting more medicine - will give Hayden imitrex now.  Pt headache almost gone (3/10),  ok to be DC   I explained the diagnosis and have given explicit precautions to return to the ER including any other new or worsening symptoms. The patient understands and accepts the medical plan as it's been dictated and I have answered their questions. Discharge instructions concerning home care and prescriptions have been given.  The patient is STABLE and is discharged to home in good condition.      Jones Skene, MD 01/25/13 [phone]

## 2013-05-01 IMAGING — CT CT HEAD W/O CM
2 series · 16 of 30 positions shown, 20 images · non-contrast
Comparison: 06/10/2008

CLINICAL DATA: Headache

CT HEAD WITHOUT CONTRAST
TECHNIQUE: Contiguous axial images were obtained from the base of
the skull through the vertex without contrast.

[Series 2: head w/o · axial · non-contrast · 0.45mm/px · z∈[-366,-246]mm · 13 of 29 slices shown, 17 images]
[im 3/29  brain]
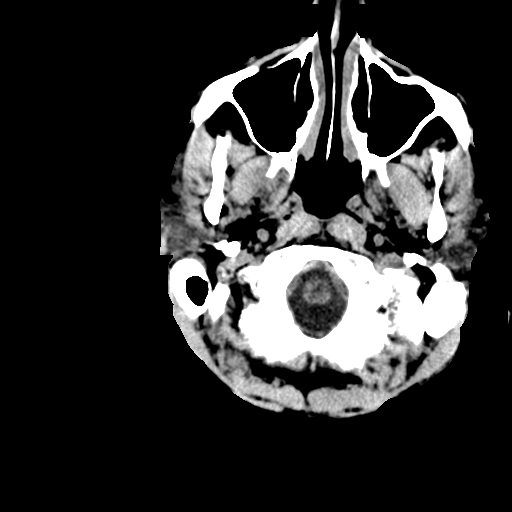
[im 3/29  bone]
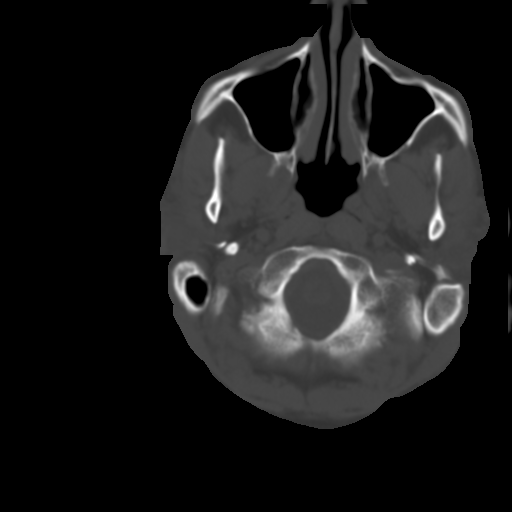
[im 5/29  brain]
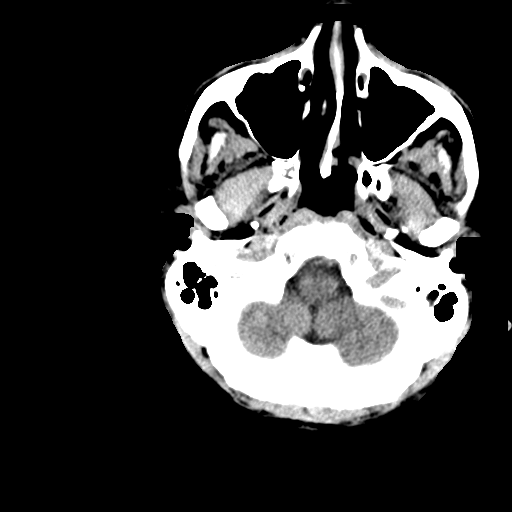
[im 7/29  brain]
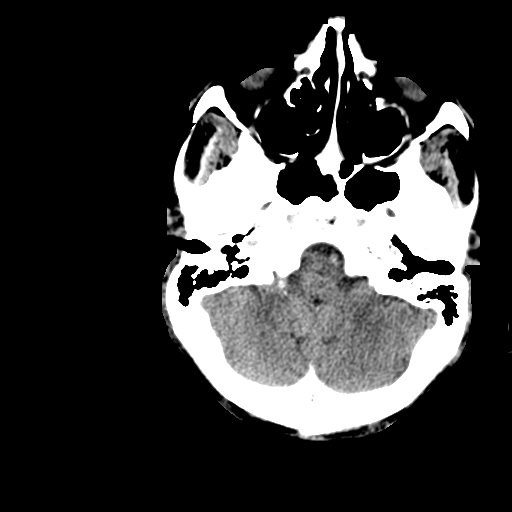
[im 9/29  brain]
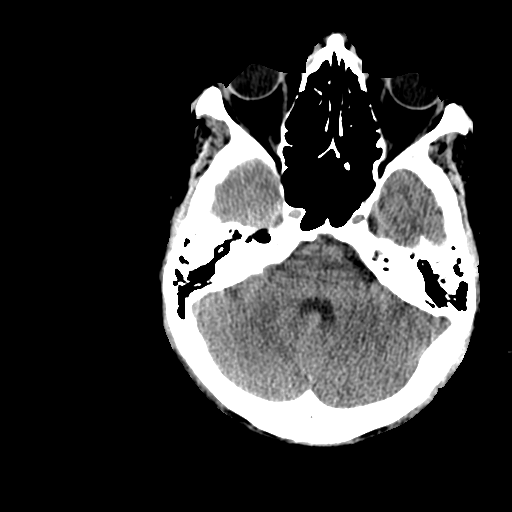
[im 11/29  brain]
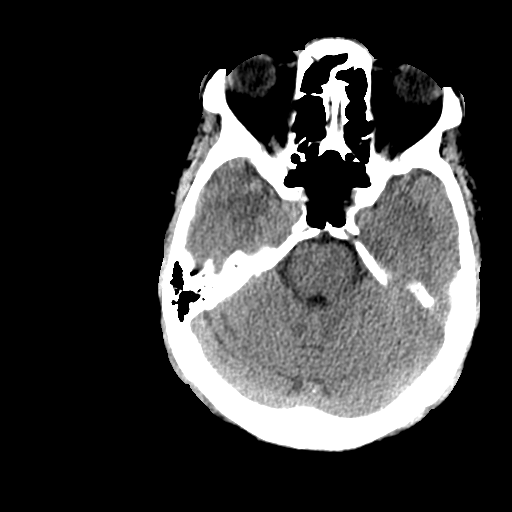
[im 11/29  bone]
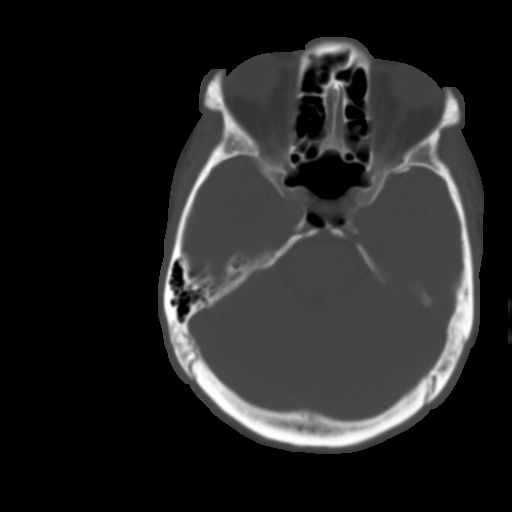
[im 13/29  brain]
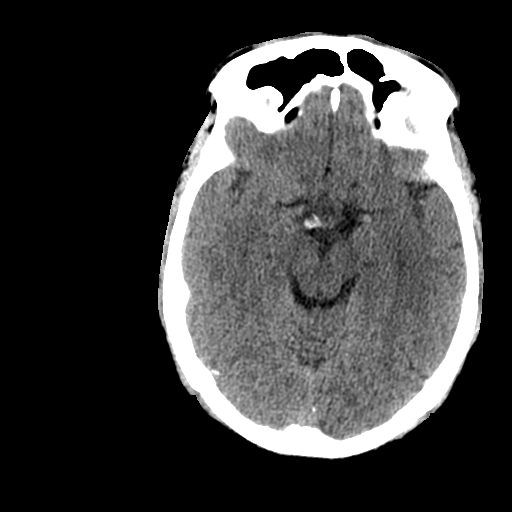
[im 15/29  brain]
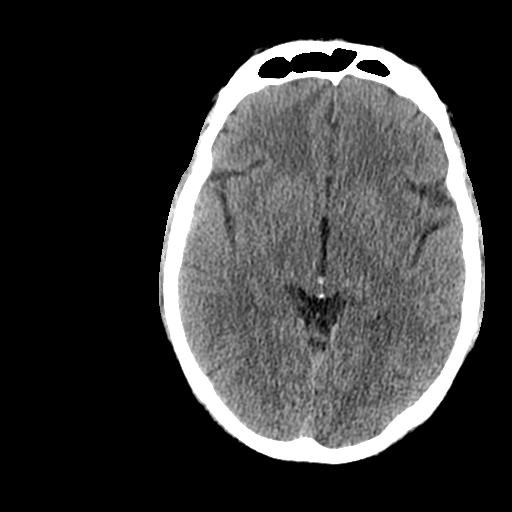
[im 17/29  brain]
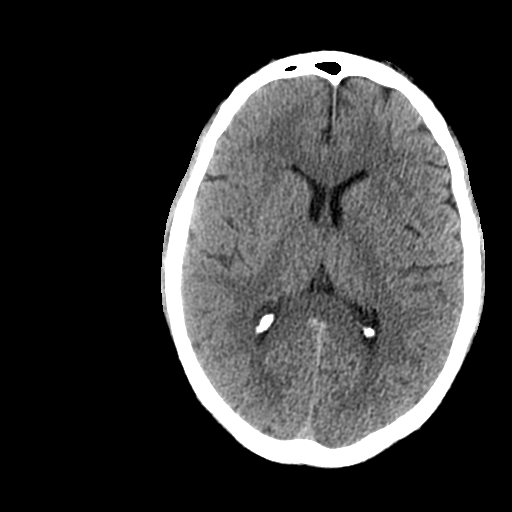
[im 19/29  brain]
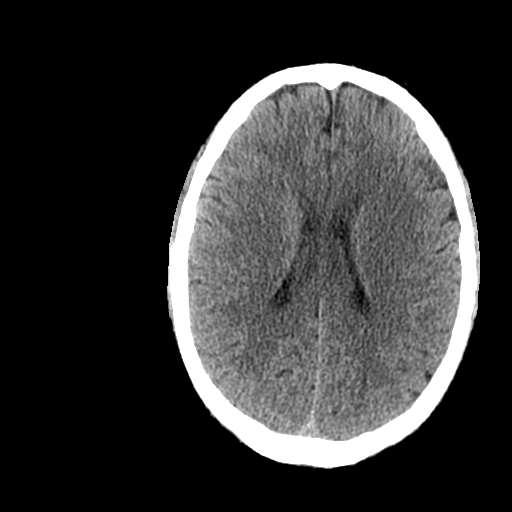
[im 19/29  bone]
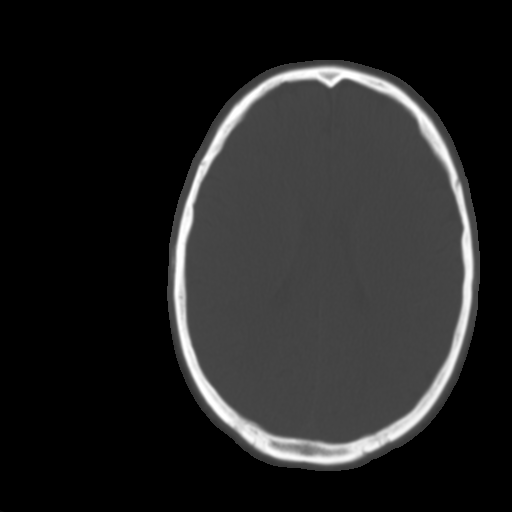
[im 21/29  brain]
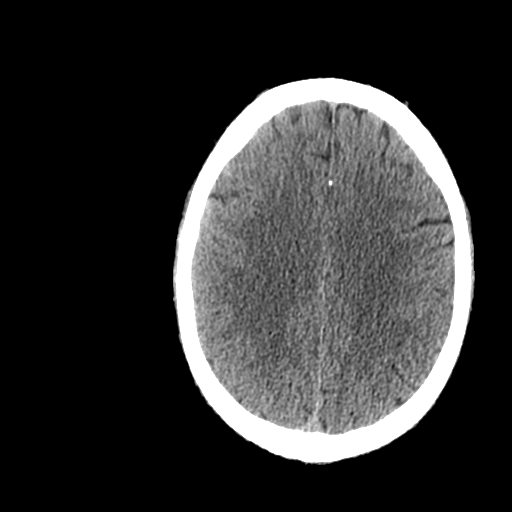
[im 23/29  brain]
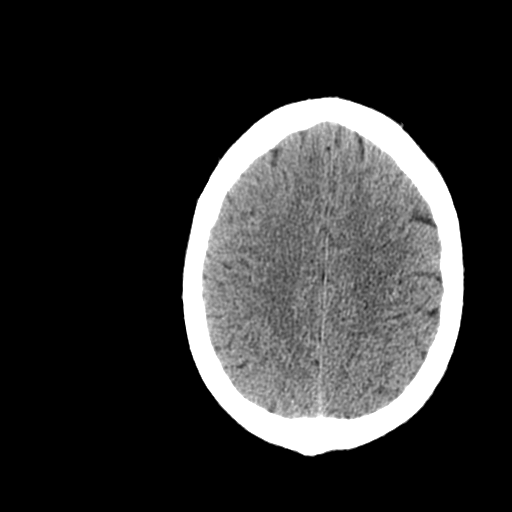
[im 25/29  brain]
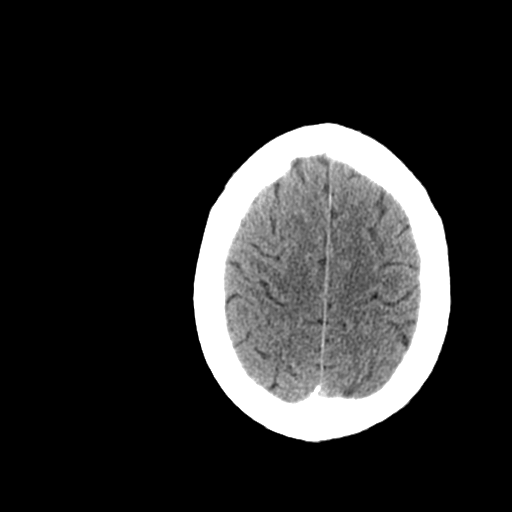
[im 27/29  brain]
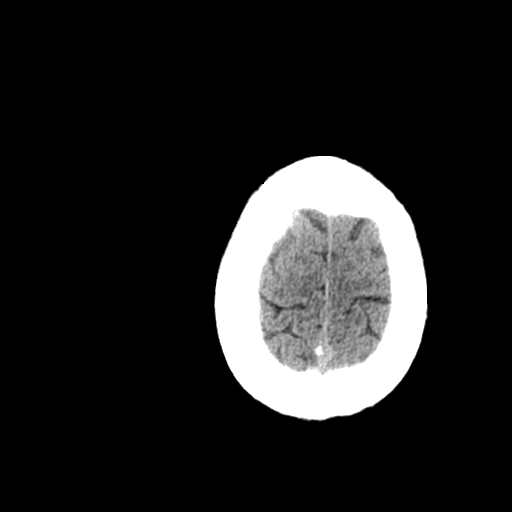
[im 27/29  bone]
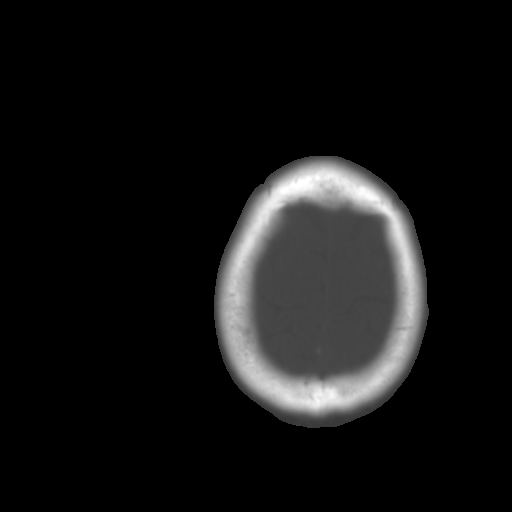

[Series 3: bone windows · axial · 0.45mm/px · z∈[-366,-326]mm · 3 of 29 slices shown]
[im 3/29  bone]
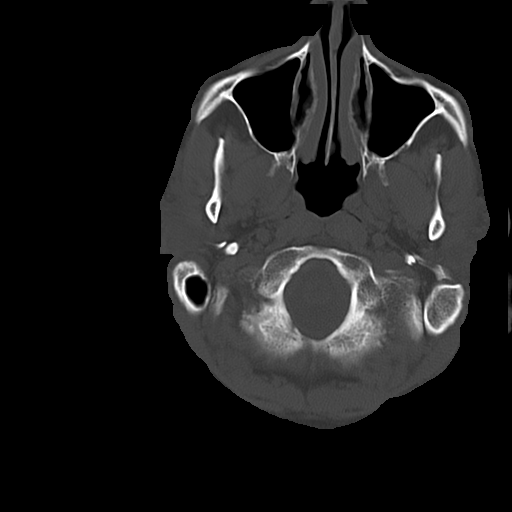
[im 7/29  bone]
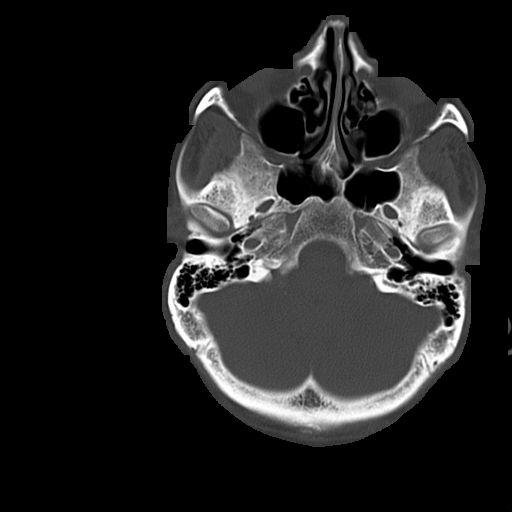
[im 11/29  bone]
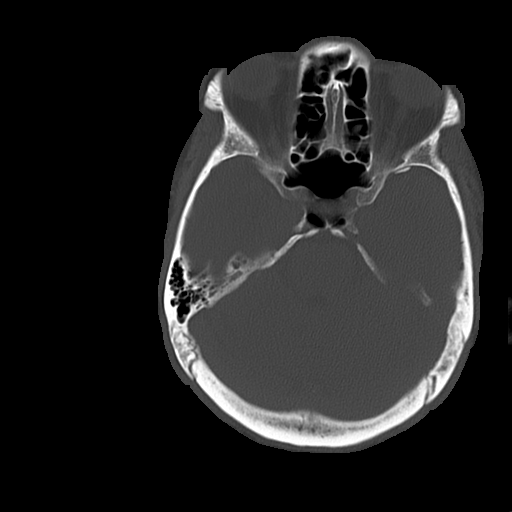

[16 of 30 positions shown; findings below may reference images not displayed]

FINDINGS: Normal ventricular morphology.
No midline shift or mass effect.
Normal appearance of brain parenchyma.
No intracranial hemorrhage, mass lesion, or acute infarction.
Visualized paranasal sinuses and mastoid air cells clear.
Bones unremarkable.
IMPRESSION: No acute intracranial abnormalities.
No interval change.

## 2014-06-05 ENCOUNTER — Encounter (HOSPITAL_COMMUNITY): Payer: Self-pay | Admitting: Emergency Medicine

## 2014-06-05 ENCOUNTER — Ambulatory Visit (HOSPITAL_COMMUNITY)
Admission: RE | Admit: 2014-06-05 | Discharge: 2014-06-05 | Disposition: A | Discharge status: Home / Self Care | Payer: Non-veteran care | Attending: Psychiatry | Admitting: Psychiatry

## 2014-06-05 ENCOUNTER — Emergency Department (HOSPITAL_COMMUNITY)
Admission: EM | Admit: 2014-06-05 | Discharge: 2014-06-06 | Disposition: A | Discharge status: Home / Self Care | Payer: Non-veteran care | Attending: Emergency Medicine | Admitting: Emergency Medicine

## 2014-06-05 DIAGNOSIS — F3289 Other specified depressive episodes: Secondary | ICD-10-CM | POA: Insufficient documentation

## 2014-06-05 DIAGNOSIS — G43909 Migraine, unspecified, not intractable, without status migrainosus: Secondary | ICD-10-CM | POA: Diagnosis not present

## 2014-06-05 DIAGNOSIS — F1410 Cocaine abuse, uncomplicated: Secondary | ICD-10-CM | POA: Diagnosis not present

## 2014-06-05 DIAGNOSIS — F1020 Alcohol dependence, uncomplicated: Secondary | ICD-10-CM | POA: Insufficient documentation

## 2014-06-05 DIAGNOSIS — Z0489 Encounter for examination and observation for other specified reasons: Secondary | ICD-10-CM | POA: Insufficient documentation

## 2014-06-05 DIAGNOSIS — F17200 Nicotine dependence, unspecified, uncomplicated: Secondary | ICD-10-CM | POA: Insufficient documentation

## 2014-06-05 DIAGNOSIS — F1420 Cocaine dependence, uncomplicated: Secondary | ICD-10-CM | POA: Insufficient documentation

## 2014-06-05 DIAGNOSIS — F4310 Post-traumatic stress disorder, unspecified: Secondary | ICD-10-CM | POA: Insufficient documentation

## 2014-06-05 DIAGNOSIS — I10 Essential (primary) hypertension: Secondary | ICD-10-CM | POA: Diagnosis not present

## 2014-06-05 DIAGNOSIS — Z79899 Other long term (current) drug therapy: Secondary | ICD-10-CM | POA: Insufficient documentation

## 2014-06-05 DIAGNOSIS — F329 Major depressive disorder, single episode, unspecified: Secondary | ICD-10-CM | POA: Diagnosis not present

## 2014-06-05 DIAGNOSIS — F1010 Alcohol abuse, uncomplicated: Secondary | ICD-10-CM | POA: Diagnosis not present

## 2014-06-05 LAB — COMPREHENSIVE METABOLIC PANEL
ALBUMIN: 3.8 g/dL (ref 3.5–5.2)
ALT: 16 U/L (ref 0–53)
AST: 20 U/L (ref 0–37)
Alkaline Phosphatase: 86 U/L (ref 39–117)
Anion gap: 14 (ref 5–15)
BILIRUBIN TOTAL: 0.3 mg/dL (ref 0.3–1.2)
BUN: 13 mg/dL (ref 6–23)
CHLORIDE: 104 meq/L (ref 96–112)
CO2: 22 mEq/L (ref 19–32)
Calcium: 9.3 mg/dL (ref 8.4–10.5)
Creatinine, Ser: 1.36 mg/dL — ABNORMAL HIGH (ref 0.50–1.35)
GFR calc Af Amer: 67 mL/min — ABNORMAL LOW (ref >90)
GFR, EST NON AFRICAN AMERICAN: 58 mL/min — AB (ref >90)
Glucose, Bld: 81 mg/dL (ref 70–99)
Potassium: 3.8 mEq/L (ref 3.7–5.3)
SODIUM: 140 meq/L (ref 137–147)
Total Protein: 7.2 g/dL (ref 6.0–8.3)

## 2014-06-05 LAB — CBC WITH DIFFERENTIAL/PLATELET
BASOS ABS: 0 10*3/uL (ref 0.0–0.1)
BASOS PCT: 0 % (ref 0–1)
Eosinophils Absolute: 0.2 10*3/uL (ref 0.0–0.7)
Eosinophils Relative: 2 % (ref 0–5)
HCT: 38 % — ABNORMAL LOW (ref 39.0–52.0)
Hemoglobin: 12.9 g/dL — ABNORMAL LOW (ref 13.0–17.0)
LYMPHS PCT: 34 % (ref 12–46)
Lymphs Abs: 3.3 10*3/uL (ref 0.7–4.0)
MCH: 26.5 pg (ref 26.0–34.0)
MCHC: 33.9 g/dL (ref 30.0–36.0)
MCV: 78.2 fL (ref 78.0–100.0)
Monocytes Absolute: 0.7 10*3/uL (ref 0.1–1.0)
Monocytes Relative: 7 % (ref 3–12)
NEUTROS ABS: 5.4 10*3/uL (ref 1.7–7.7)
Neutrophils Relative %: 57 % (ref 43–77)
Platelets: 364 10*3/uL (ref 150–400)
RBC: 4.86 MIL/uL (ref 4.22–5.81)
RDW: 15.2 % (ref 11.5–15.5)
WBC: 9.6 10*3/uL (ref 4.0–10.5)

## 2014-06-05 LAB — RAPID URINE DRUG SCREEN, HOSP PERFORMED
AMPHETAMINES: NOT DETECTED
BARBITURATES: NOT DETECTED
Benzodiazepines: NOT DETECTED
COCAINE: POSITIVE — AB
OPIATES: NOT DETECTED
TETRAHYDROCANNABINOL: NOT DETECTED

## 2014-06-05 LAB — ETHANOL: Alcohol, Ethyl (B): 12 mg/dL — ABNORMAL HIGH (ref 0–11)

## 2014-06-05 MED ORDER — ALUM & MAG HYDROXIDE-SIMETH 200-200-20 MG/5ML PO SUSP: 30.0000 mL | ORAL | Status: DC | PRN

## 2014-06-05 MED ORDER — LORAZEPAM 1 MG PO TABS: 0.0000 mg | ORAL_TABLET | Freq: Four times a day (QID) | ORAL | Status: DC

## 2014-06-05 MED ORDER — NICOTINE 21 MG/24HR TD PT24: 21.0000 mg | MEDICATED_PATCH | Freq: Every day | TRANSDERMAL | Status: DC

## 2014-06-05 MED ORDER — TRAZODONE HCL 50 MG PO TABS: 50.0000 mg | ORAL_TABLET | Freq: Every evening | ORAL | Status: DC | PRN

## 2014-06-05 MED ORDER — SUMATRIPTAN SUCCINATE 50 MG PO TABS
50.0000 mg | ORAL_TABLET | ORAL | Status: DC | PRN
  Filled 2014-06-05: qty 1

## 2014-06-05 MED ORDER — BUPROPION HCL ER (SR) 150 MG PO TB12: 150.0000 mg | ORAL_TABLET | ORAL | Status: DC

## 2014-06-05 MED ORDER — ONDANSETRON HCL 4 MG PO TABS: 4.0000 mg | ORAL_TABLET | Freq: Three times a day (TID) | ORAL | Status: DC | PRN

## 2014-06-05 MED ORDER — IBUPROFEN 200 MG PO TABS: 600.0000 mg | ORAL_TABLET | Freq: Three times a day (TID) | ORAL | Status: DC | PRN

## 2014-06-05 MED ORDER — ZOLPIDEM TARTRATE 5 MG PO TABS: 5.0000 mg | ORAL_TABLET | Freq: Every evening | ORAL | Status: DC | PRN

## 2014-06-05 MED ORDER — LORAZEPAM 1 MG PO TABS: 0.0000 mg | ORAL_TABLET | Freq: Two times a day (BID) | ORAL | Status: DC

## 2014-06-05 MED ORDER — HYDROCHLOROTHIAZIDE 12.5 MG PO CAPS: 12.5000 mg | ORAL_CAPSULE | Freq: Every day | ORAL | Status: DC

## 2014-06-05 MED ORDER — LISINOPRIL-HYDROCHLOROTHIAZIDE 10-12.5 MG PO TABS: 1.0000 | ORAL_TABLET | Freq: Every day | ORAL | Status: DC

## 2014-06-05 MED ORDER — VITAMIN B-1 100 MG PO TABS: 100.0000 mg | ORAL_TABLET | Freq: Every day | ORAL | Status: DC

## 2014-06-05 MED ORDER — ACETAMINOPHEN 325 MG PO TABS: 650.0000 mg | ORAL_TABLET | ORAL | Status: DC | PRN

## 2014-06-05 MED ORDER — THIAMINE HCL 100 MG/ML IJ SOLN: 100.0000 mg | Freq: Every day | INTRAMUSCULAR | Status: DC

## 2014-06-05 MED ORDER — HYDROXYZINE HCL 25 MG PO TABS: 25.0000 mg | ORAL_TABLET | Freq: Three times a day (TID) | ORAL | Status: DC | PRN

## 2014-06-05 MED ORDER — BUPROPION HCL 75 MG PO TABS
75.0000 mg | ORAL_TABLET | ORAL | Status: DC
  Filled 2014-06-05: qty 1

## 2014-06-05 MED ORDER — LISINOPRIL 10 MG PO TABS: 10.0000 mg | ORAL_TABLET | Freq: Every day | ORAL | Status: DC

## 2014-06-05 MED ORDER — VERAPAMIL HCL ER 240 MG PO TBCR
240.0000 mg | EXTENDED_RELEASE_TABLET | Freq: Every day | ORAL | Status: DC
  Filled 2014-06-05: qty 1

## 2014-06-05 NOTE — ED Notes (Signed)
Pt requesting detox from [facility].  Last drink was today.  Normally drink 1/2 to 1 pint daily.  Pt denies any pain at this time.

## 2014-06-05 NOTE — ED Provider Notes (Signed)
CSN: [id number]     Arrival date & time 06/05/14  2220 History  This chart was scribed for non-physician practitioner, Fayrene Helper, PA-C,working with Vida Roller, MD, by Karle Plumber, ED Scribe. This patient was seen in room WTR4/WLPT4 and the patient's care was started at 11:16 PM.  Chief Complaint  Patient presents with  . Drug / Alcohol Assessment   The history is provided by the patient. No language interpreter was used.   HPI Comments:  Alexander Munoz is a 53 y.o. male with h/o substance abuse who presents to the Emergency Department wanting detox from drugs and alcohol. He states he has been drinking and smoking cocaine for the past 8 months. He reports his last drink was approximately three hours ago and his last cocaine use was also earlier today. Pt reports he normally drinks 1-1.5 pints of alcohol a day. He reports mild HA. He states he does not sleep well. He states he is trying to get into [facility] but has to go through detox first. He reports PMH of depression and takes daily medication for it. He denies SI, HI, hallucinations, or any other pain.  Past Medical History  Diagnosis Date  . Migraines   . Hypertension    History reviewed. No pertinent past surgical history. No family history on file. History  Substance Use Topics  . Smoking status: Current Every Day Smoker    Types: Cigarettes  . Smokeless tobacco: Not on file  . Alcohol Use: Yes    Review of Systems  HENT: Negative for ear pain.   Gastrointestinal: Negative for abdominal pain.  Musculoskeletal: Negative for neck pain.  Neurological: Positive for headaches.  Psychiatric/Behavioral: Negative for suicidal ideas and hallucinations.    Allergies  Review of patient's allergies indicates no known allergies.  Home Medications   Prior to Admission medications   Medication Sig Start Date End Date Taking? Authorizing Provider  buPROPion (WELLBUTRIN SR) 150 MG 12 hr tablet Take 150 mg by mouth 2 (two) times  daily.   Yes Historical Provider, MD  buPROPion (WELLBUTRIN) 75 MG tablet Take 75 mg by mouth daily.   Yes Historical Provider, MD  hydrOXYzine (ATARAX/VISTARIL) 25 MG tablet Take 25 mg by mouth 3 (three) times daily as needed for anxiety.   Yes Historical Provider, MD  lisinopril-hydrochlorothiazide (PRINZIDE,ZESTORETIC) 10-12.5 MG per tablet Take 1 tablet by mouth daily.   Yes Historical Provider, MD  omeprazole (PRILOSEC) 20 MG capsule Take 20 mg by mouth daily.   Yes Historical Provider, MD  sildenafil (VIAGRA) 100 MG tablet Take 100 mg by mouth daily as needed for erectile dysfunction.   Yes Historical Provider, MD  traZODone (DESYREL) 100 MG tablet Take 50 mg by mouth at bedtime as needed for sleep.   Yes Historical Provider, MD  verapamil (COVERA HS) 240 MG (CO) 24 hr tablet Take 240 mg by mouth at bedtime.   Yes Historical Provider, MD  SUMAtriptan (IMITREX) 50 MG tablet Take 50 mg by mouth every 2 (two) hours as needed. For migraine    Historical Provider, MD   Triage Vitals: BP 146/85  Pulse 77  Temp(Src) 98.5 F (36.9 C) (Oral)  Resp 21  SpO2 97% Physical Exam  Nursing note and vitals reviewed. Constitutional: He is oriented to person, place, and time. He appears well-developed and well-nourished.  HENT:  Head: Normocephalic and atraumatic.  Eyes: EOM are normal.  Neck: Normal range of motion.  Cardiovascular: Normal rate, regular rhythm and normal heart sounds.  Exam  reveals no gallop and no friction rub.   No murmur heard. Pulmonary/Chest: Effort normal and breath sounds normal. No respiratory distress. He has no wheezes. He has no rales.  Musculoskeletal: Normal range of motion.  Neurological: He is alert and oriented to person, place, and time.  Skin: Skin is warm and dry.  Psychiatric: He has a normal mood and affect. His behavior is normal.    ED Course  Procedures (including critical care time) DIAGNOSTIC STUDIES: Oxygen Saturation is 97% on RA, normal by my  interpretation.   COORDINATION OF CARE: 11:20 PM- Will order standard medical clearance labs. Pt verbalizes understanding and agrees to plan.  12:21 AM Pt is medically cleared.  Likely stable to [facility].    Medications  acetaminophen (TYLENOL) tablet 650 mg (not administered)  ibuprofen (ADVIL,MOTRIN) tablet 600 mg (not administered)  zolpidem (AMBIEN) tablet 5 mg (not administered)  nicotine (NICODERM CQ - dosed in mg/24 hours) patch 21 mg (not administered)  ondansetron (ZOFRAN) tablet 4 mg (not administered)  alum & mag hydroxide-simeth (MAALOX/MYLANTA) 200-200-20 MG/5ML suspension 30 mL (not administered)  LORazepam (ATIVAN) tablet 0-4 mg (not administered)    Followed by  LORazepam (ATIVAN) tablet 0-4 mg (not administered)  thiamine (VITAMIN B-1) tablet 100 mg (not administered)    Or  thiamine (B-1) injection 100 mg (not administered)  buPROPion (WELLBUTRIN SR) 12 hr tablet 150 mg (not administered)  buPROPion (WELLBUTRIN) tablet 75 mg (not administered)  hydrOXYzine (ATARAX/VISTARIL) tablet 25 mg (not administered)  SUMAtriptan (IMITREX) tablet 50 mg (not administered)  traZODone (DESYREL) tablet 50 mg (not administered)  verapamil (CALAN-SR) CR tablet 240 mg (not administered)  lisinopril (PRINIVIL,ZESTRIL) tablet 10 mg (not administered)  hydrochlorothiazide (MICROZIDE) capsule 12.5 mg (not administered)    Labs Review Labs Reviewed  CBC WITH DIFFERENTIAL - Abnormal; Notable for the following:    Hemoglobin 12.9 (*)    HCT 38.0 (*)    All other components within normal limits  COMPREHENSIVE METABOLIC PANEL - Abnormal; Notable for the following:    Creatinine, Ser 1.36 (*)    GFR calc non Af Amer 58 (*)    GFR calc Af Amer 67 (*)    All other components within normal limits  URINE RAPID DRUG SCREEN (HOSP PERFORMED) - Abnormal; Notable for the following:    Cocaine POSITIVE (*)    All other components within normal limits  ETHANOL -  Abnormal; Notable for the following:    Alcohol, Ethyl (B) 12 (*)    All other components within normal limits    Imaging Review No results found.   EKG Interpretation None      MDM   Final diagnoses:  Cocaine abuse  Alcohol abuse    BP 146/85  Pulse 77  Temp(Src) 98.5 F (36.9 C) (Oral)  Resp 21  SpO2 97%   I personally performed the services described in this documentation, which was scribed in my presence. The recorded information has been reviewed and is accurate.    Fayrene HelperBowie Davis Vannatter, PA-C 06/06/14 0022  Fayrene HelperBowie Ronae Noell, PA-C 06/06/14 [phone]
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
## 2014-06-06 NOTE — Discharge Instructions (Signed)
[facility] management and help with your detox.     Emergency Department Resource Guide 1) Find a Doctor and Pay Out of Pocket Although you won't have to find out who is covered by your insurance plan, it is a good idea to ask around and get recommendations. You will then need to call the office and see if the doctor you have chosen will accept you as a new patient and what types of options they offer for patients who are self-pay. Some doctors offer discounts or will set up payment plans for their patients who do not have insurance, but you will need to ask so you aren't surprised when you get to your appointment.  2) Contact Your Local Health Department Not all health departments have doctors that can see patients for sick visits, but many do, so it is worth a call to see if yours does. If you don't know where your local health department is, you can check in your phone book. The CDC also has a tool to help you locate your state's health department, and many state websites also have listings of all of their local health departments.  3) Find a Walk-in Clinic If your illness is not likely to be very severe or complicated, you may want to try a walk in clinic. These are popping up all over the country in pharmacies, drugstores, and shopping centers. They're usually staffed by nurse practitioners or physician assistants that have been trained to treat common illnesses and complaints. They're usually fairly quick and inexpensive. However, if you have serious medical issues or chronic medical problems, these are probably not your best option.  No Primary Care Doctor: - Call Health Connect at  [phone] - they can help you locate a primary care doctor that  accepts your insurance, provides certain services, etc. - Physician Referral Service- [phone]  Chronic Pain Problems: Organization         Address  Phone   Notes  [facility]  [phone]  Patients need to be referred by their primary care doctor.   Medication Assistance: Organization         Address  Phone   Notes  [facility] Medication [facility] [address] [place], [place] [address] [phone] --Must be a resident of [facility] [place] -- Must have NO insurance coverage whatsoever (no Medicaid/ Medicare, etc.) -- The pt. MUST have a primary care doctor that directs their care regularly and follows them in the community   MedAssist  [phone]   [employer]  [phone]) [phone]    Agencies that provide inexpensive medical care: Organization         Address  Phone   Notes  [facility]  [phone]   [facility]    [phone]   [facility] [address] [place], [place] [address] [phone]) [phone]   [facility] [address], [place] [phone]) [phone]   Planned Parenthood    [phone]) [phone]   [facility]    [phone]) [phone]   [facility] and [facility]  [address], [place] Phone:  [phone], Fax:  [phone]) [phone] Hours of Operation:  9 am - 6 pm, M-F.  Also accepts Medicaid/Medicare and self-pay.  [facility]  [address], [place] Phone: [phone], Fax: [phone]) [phone]. Hours of Operation:  8:30 am - 5:30 pm, M-F.  Also accepts Medicaid and self-pay.  [facility] [place] [address], [place] Phone: [phone](336) [phone]   [facility] [address], [place], [place] [phone], Ext. [phone] Mondays & Thursdays: 7-9 AM.  First 15 patients are seen on a first come, first serve basis.    Medicaid-accepting [facility] Providers:  Organization         Address  Phone   Notes  [facility] [address], [place] [phone]) [phone] Also accepts self-pay patients.  [facility] [address], [place]  [phone](336)  [phone]   [facility] [address], [place] [phone]) [phone]   [facility] [address] [place] [address], [place] [phone]) [phone]   Renaye RakersVeita Bland [address], [place]   [phone] Only accepts [place] Access [place] patients after they have their name applied to their card.   Self-Pay (no insurance) in [facility]:  Organization         Address  Phone   Notes  Sickle Cell Patients, [facility] [address], [place] [phone]   [facility] [address], [place] [phone]) [phone]   [facility] [place]  [address], [place] [phone]   [facility]/Dr. Osei-Bonsu  [address] [place] [address], [place] or [phone], [place] [phone] Phone number for both [place] and [place] locations is the same.  [facility] [address], [place] [phone]) [phone]   [facility] [place] [address] [place] [address], [place] or [address] [phone]) [phone]   [facility] [address], [place] [phone]) [phone], phone; [phone]) [phone], fax Sees patients 1st and 3rd Saturday of every month.  Must not qualify for public or private insurance (i.e. Medicaid, Medicare, [place] Health Choice, Veterans' Benefits)  Household income should be no more than 200% of the poverty level The clinic cannot treat you if you are pregnant or think you are pregnant  Sexually transmitted diseases are not treated at the clinic.    Dental Care: Organization         Address  Phone  Notes  [facility] Department of [facility] [address], [place] [phone] Accepts children up to age 54 who are enrolled in [place] or [place] Health Choice; pregnant women with a Medicaid card; and children who have applied for Medicaid or [place] Health Choice, but were  declined, whose parents can pay a reduced fee at time of service.  [facility] Department of [facility] [place]  [address], [place] [phone]) [phone] Accepts children up to age 54 who are enrolled in [place] or [place] Health Choice; pregnant women with a Medicaid card; and children who have applied for Medicaid or [place] Health Choice, but were declined, whose parents can pay a reduced fee at time of service.  [facility]  [address], [place] [phone] Patients are seen by appointment only. Walk-ins are not accepted. [facility] will see patients 54 years of age and older. Monday - Tuesday (8am-5pm) Most Wednesdays (8:30-5pm) $30 per visit, cash only  [facility]  [address], [facility] [phone] Patients are seen by appointment  only. Walk-ins are not accepted. George will see patients 57 years of age and older. One Wednesday Evening (Monthly: Volunteer Based).  $30 per visit, cash only  [place]  [phone] for adults; Children under age 3, call Graduate Pediatric Dentistry at [phone]. Children aged 5-14, please call [phone] to request a pediatric application.  Dental services are provided in all areas of dental care including fillings, crowns and bridges, complete and partial dentures, implants, gum treatment, root canals, and extractions. Preventive care is also provided. Treatment is provided to both adults and children. Patients are selected via a lottery and there is often a waiting list.   [facility] [address], [place]  [phone] [email or web address]   [facility] [address] [place], [place] [phone], Ext. [phone] Second and Fourth Thursday of each month, opens at 6:30 AM; Clinic ends at 9 AM.  Patients are seen on a first-come first-served basis, and a limited number are seen during each clinic.    [facility]  [address] Hillard Danker [place], [place] [phone]   Eligibility Requirements You must have lived in [place], [place], or [place] counties for at least the last three months.   You cannot be eligible for state or federal sponsored [employer], including [employer], [place], or [employer].   You generally cannot be eligible for healthcare insurance through your employer.    How to apply: Eligibility screenings are held every Tuesday and Wednesday afternoon from 1:00 pm until 4:00 pm. You do not need an appointment for the interview!  [facility] [address], [place], [place]   [place]  [place] Department  [place]  [phone]    Behavioral Health Resources in the Community: Intensive Outpatient Programs Organization         Address  Phone  Notes  [place]. [address], [place], [place] [phone]   [facility] [address] [place], [place]   ADS: Alcohol & Drug Svcs [address] [place], [place]   [place] [address]  [place], [place] or [phone]   Substance Abuse Resources Organization         Address  Phone  Notes  Alcohol and Drug Services  [phone]   [place]  [phone]   The [place]   Chinita Pester  [phone]   Residential & Outpatient Substance Abuse Program  [phone]   Psychological Services Organization         Address  Phone  Notes  [facility]  [place]  [phone]   [place] [address], [place] or [phone]    Mobile Crisis Teams Organization         Address  Phone  Notes  [facility], Mobile Crisis Care  Unit  [phone]   [facility]  [address] [place], [place]   Bascom Levels [address] [place] [phone]    Self-Help/Support Groups Organization         Address  Phone             Notes  [place]. of [place] - variety of support groups  [place]de Call for more information  Narcotics Anonymous (NA), [facility] [address], [employer] [place]  2 meetings at this location   Special educational needs teacher         Address  Phone  Notes  ASAP Residential Treatment [place],    [place]  [phone]   [facility]  [address] [place] [address]641, [place], [place]   [place], [place] [phone] Admissions: 8am-3pm M-F  Incentives Substance [place] [address]    [place], [place] [phone]   [facility] [address] [place], [place], [place]   [facility] [address],  [place], [place]   [facility] - Intensive Outpatient [address], [place], [place]   [facility] ([address] [place].,  [place], [place] [phone] or [phone]   [facility] Services (RTS) [address] [place], [place] Accepts Medicaid  [facility] [place] [address],  [place] [phone] Substance Abuse/Addiction Treatment   [facility] Organization         Address  Phone  Notes  [facility]  [phone]   Domenic Schwab, PhD [address] Arlis Porta [place], [place]   [phone] or [phone]   [place], [place] [phone]   [facility] [address], [place], [place] [phone] Insurance/Medicaid/sponsorship through [facility] and Families [address] [place]                                     [place], [place] [phone] [place] [address], [place] [phone]    Dr. Adele Schilder  [phone]   [facility] [place] Dept. 1) [address], [place] 2) [place] 3)  [place] [address], [place] [phone]  [phone]   [place] [phone] or [phone] (After Hours)

## 2014-06-06 NOTE — ED Provider Notes (Signed)
Medical screening examination/treatment/procedure(s) were performed by non-physician practitioner and as supervising physician I was immediately available for consultation/collaboration.    Javis Abboud D Paulanthony Gleaves, MD 06/06/14 0306 

## 2014-08-30 ENCOUNTER — Ambulatory Visit (HOSPITAL_COMMUNITY)
Admission: AD | Admit: 2014-08-30 | Discharge: 2014-08-30 | Disposition: A | Discharge status: Home / Self Care | Payer: Non-veteran care | Attending: Psychiatry | Admitting: Psychiatry

## 2014-08-30 DIAGNOSIS — X58XXXA Exposure to other specified factors, initial encounter: Secondary | ICD-10-CM | POA: Insufficient documentation

## 2014-08-30 DIAGNOSIS — F1099 Alcohol use, unspecified with unspecified alcohol-induced disorder: Secondary | ICD-10-CM | POA: Insufficient documentation

## 2014-08-30 DIAGNOSIS — I10 Essential (primary) hypertension: Secondary | ICD-10-CM | POA: Insufficient documentation

## 2014-08-30 DIAGNOSIS — F1490 Cocaine use, unspecified, uncomplicated: Secondary | ICD-10-CM | POA: Insufficient documentation

## 2014-08-30 DIAGNOSIS — F4310 Post-traumatic stress disorder, unspecified: Secondary | ICD-10-CM | POA: Insufficient documentation

## 2014-08-30 DIAGNOSIS — F339 Major depressive disorder, recurrent, unspecified: Secondary | ICD-10-CM | POA: Insufficient documentation

## 2014-08-31 ENCOUNTER — Encounter (HOSPITAL_COMMUNITY): Payer: Self-pay | Admitting: Emergency Medicine

## 2014-08-31 ENCOUNTER — Emergency Department (HOSPITAL_COMMUNITY)
Admission: EM | Admit: 2014-08-31 | Discharge: 2014-08-31 | Disposition: A | Discharge status: Home / Self Care | Payer: Non-veteran care | Source: Home / Self Care | Attending: Emergency Medicine | Admitting: Emergency Medicine

## 2014-08-31 ENCOUNTER — Encounter (HOSPITAL_COMMUNITY): Payer: Self-pay | Admitting: Behavioral Health

## 2014-08-31 ENCOUNTER — Emergency Department (EMERGENCY_DEPARTMENT_HOSPITAL)
Admission: EM | Admit: 2014-08-31 | Discharge: 2014-08-31 | Disposition: A | Discharge status: Other Acute Inpatient Hospital | Payer: Non-veteran care | Source: Home / Self Care | Attending: Emergency Medicine | Admitting: Emergency Medicine

## 2014-08-31 ENCOUNTER — Observation Stay (HOSPITAL_COMMUNITY)
Admission: AD | Admit: 2014-08-31 | Discharge: 2014-09-01 | Disposition: A | Discharge status: Home / Self Care | Payer: Non-veteran care | Source: Intra-hospital | Attending: Psychiatry | Admitting: Psychiatry

## 2014-08-31 DIAGNOSIS — G43909 Migraine, unspecified, not intractable, without status migrainosus: Secondary | ICD-10-CM

## 2014-08-31 DIAGNOSIS — Z720 Tobacco use: Secondary | ICD-10-CM | POA: Insufficient documentation

## 2014-08-31 DIAGNOSIS — I10 Essential (primary) hypertension: Secondary | ICD-10-CM

## 2014-08-31 DIAGNOSIS — F1910 Other psychoactive substance abuse, uncomplicated: Secondary | ICD-10-CM

## 2014-08-31 DIAGNOSIS — Z79899 Other long term (current) drug therapy: Secondary | ICD-10-CM | POA: Insufficient documentation

## 2014-08-31 DIAGNOSIS — F1420 Cocaine dependence, uncomplicated: Secondary | ICD-10-CM | POA: Diagnosis present

## 2014-08-31 DIAGNOSIS — F1010 Alcohol abuse, uncomplicated: Secondary | ICD-10-CM | POA: Diagnosis present

## 2014-08-31 DIAGNOSIS — F17210 Nicotine dependence, cigarettes, uncomplicated: Secondary | ICD-10-CM | POA: Insufficient documentation

## 2014-08-31 DIAGNOSIS — F332 Major depressive disorder, recurrent severe without psychotic features: Secondary | ICD-10-CM | POA: Insufficient documentation

## 2014-08-31 LAB — COMPREHENSIVE METABOLIC PANEL
ALT: 19 U/L (ref 0–53)
AST: 21 U/L (ref 0–37)
Albumin: 4.2 g/dL (ref 3.5–5.2)
Alkaline Phosphatase: 94 U/L (ref 39–117)
Anion gap: 18 — ABNORMAL HIGH (ref 5–15)
BUN: 14 mg/dL (ref 6–23)
CO2: 21 mEq/L (ref 19–32)
Calcium: 9.2 mg/dL (ref 8.4–10.5)
Chloride: 100 mEq/L (ref 96–112)
Creatinine, Ser: 1.34 mg/dL (ref 0.50–1.35)
GFR calc Af Amer: 68 mL/min — ABNORMAL LOW (ref >90)
GFR calc non Af Amer: 59 mL/min — ABNORMAL LOW (ref >90)
Glucose, Bld: 74 mg/dL (ref 70–99)
Potassium: 4 mEq/L (ref 3.7–5.3)
Sodium: 139 mEq/L (ref 137–147)
Total Bilirubin: 0.3 mg/dL (ref 0.3–1.2)
Total Protein: 7.8 g/dL (ref 6.0–8.3)

## 2014-08-31 LAB — URINALYSIS, ROUTINE W REFLEX MICROSCOPIC
Bilirubin Urine: NEGATIVE
Glucose, UA: NEGATIVE mg/dL
Hgb urine dipstick: NEGATIVE
Ketones, ur: 15 mg/dL — AB
Leukocytes, UA: NEGATIVE
Nitrite: NEGATIVE
Protein, ur: 30 mg/dL — AB
Specific Gravity, Urine: 1.034 — ABNORMAL HIGH (ref 1.005–1.030)
Urobilinogen, UA: 1 mg/dL (ref 0.0–1.0)
pH: 5.5 (ref 5.0–8.0)

## 2014-08-31 LAB — CBC
HCT: 40.2 % (ref 39.0–52.0)
Hemoglobin: 13.3 g/dL (ref 13.0–17.0)
MCH: 26.2 pg (ref 26.0–34.0)
MCHC: 33.1 g/dL (ref 30.0–36.0)
MCV: 79.1 fL (ref 78.0–100.0)
Platelets: 392 10*3/uL (ref 150–400)
RBC: 5.08 MIL/uL (ref 4.22–5.81)
RDW: 14.5 % (ref 11.5–15.5)
WBC: 7.4 10*3/uL (ref 4.0–10.5)

## 2014-08-31 LAB — URINE MICROSCOPIC-ADD ON

## 2014-08-31 LAB — RAPID URINE DRUG SCREEN, HOSP PERFORMED
Amphetamines: NOT DETECTED
Barbiturates: NOT DETECTED
Benzodiazepines: NOT DETECTED
Cocaine: POSITIVE — AB
Opiates: NOT DETECTED
Tetrahydrocannabinol: NOT DETECTED

## 2014-08-31 LAB — ETHANOL: Alcohol, Ethyl (B): 16 mg/dL — ABNORMAL HIGH (ref 0–11)

## 2014-08-31 MED ORDER — BUPROPION HCL 75 MG PO TABS
75.0000 mg | ORAL_TABLET | Freq: Every day | ORAL | Status: DC
  Administered 2014-08-31: 75 mg via ORAL
  Filled 2014-08-31: qty 1

## 2014-08-31 MED ORDER — VERAPAMIL HCL 240 MG (CO) PO TB24
240.0000 mg | ORAL_TABLET | Freq: Every day | ORAL | Status: DC
  Filled 2014-08-31 (×2): qty 1

## 2014-08-31 MED ORDER — PANTOPRAZOLE SODIUM 40 MG PO TBEC
40.0000 mg | DELAYED_RELEASE_TABLET | Freq: Every day | ORAL | Status: DC
  Filled 2014-08-31 (×2): qty 1

## 2014-08-31 MED ORDER — TRAZODONE HCL 50 MG PO TABS: 50.0000 mg | ORAL_TABLET | Freq: Every evening | ORAL | Status: DC | PRN

## 2014-08-31 MED ORDER — HYDROCHLOROTHIAZIDE 12.5 MG PO CAPS
12.5000 mg | ORAL_CAPSULE | Freq: Every day | ORAL | Status: DC
  Administered 2014-08-31: 12.5 mg via ORAL
  Filled 2014-08-31: qty 1

## 2014-08-31 MED ORDER — PANTOPRAZOLE SODIUM 40 MG PO TBEC
40.0000 mg | DELAYED_RELEASE_TABLET | Freq: Every day | ORAL | Status: DC
  Administered 2014-08-31: 40 mg via ORAL
  Filled 2014-08-31: qty 1

## 2014-08-31 MED ORDER — HYDROXYZINE HCL 25 MG PO TABS
25.0000 mg | ORAL_TABLET | Freq: Three times a day (TID) | ORAL | Status: DC | PRN
  Administered 2014-08-31: 25 mg via ORAL
  Filled 2014-08-31: qty 1

## 2014-08-31 MED ORDER — VERAPAMIL HCL ER 240 MG PO TBCR
240.0000 mg | EXTENDED_RELEASE_TABLET | Freq: Every day | ORAL | Status: DC
  Administered 2014-08-31: 240 mg via ORAL
  Filled 2014-08-31 (×3): qty 1

## 2014-08-31 MED ORDER — LISINOPRIL 10 MG PO TABS
10.0000 mg | ORAL_TABLET | Freq: Every day | ORAL | Status: DC
  Administered 2014-08-31: 10 mg via ORAL
  Filled 2014-08-31: qty 1

## 2014-08-31 MED ORDER — PANTOPRAZOLE SODIUM 40 MG PO TBEC
40.0000 mg | DELAYED_RELEASE_TABLET | Freq: Every day | ORAL | Status: DC
  Administered 2014-08-31 – 2014-09-01 (×2): 40 mg via ORAL
  Filled 2014-08-31 (×5): qty 1

## 2014-08-31 MED ORDER — VERAPAMIL HCL ER 240 MG PO TBCR
240.0000 mg | EXTENDED_RELEASE_TABLET | Freq: Every day | ORAL | Status: DC
  Filled 2014-08-31: qty 1

## 2014-08-31 MED ORDER — TRAZODONE HCL 50 MG PO TABS
50.0000 mg | ORAL_TABLET | Freq: Every evening | ORAL | Status: DC | PRN
  Administered 2014-08-31: 50 mg via ORAL
  Filled 2014-08-31: qty 1

## 2014-08-31 MED ORDER — BUPROPION HCL 75 MG PO TABS
75.0000 mg | ORAL_TABLET | Freq: Every day | ORAL | Status: DC
Start: 2014-09-01 — End: 2014-09-01
  Administered 2014-09-01: 75 mg via ORAL
  Filled 2014-08-31 (×3): qty 1

## 2014-08-31 MED ORDER — LISINOPRIL-HYDROCHLOROTHIAZIDE 10-12.5 MG PO TABS: 1.0000 | ORAL_TABLET | Freq: Every day | ORAL | Status: DC

## 2014-08-31 MED ORDER — HYDROXYZINE HCL 25 MG PO TABS: 25.0000 mg | ORAL_TABLET | Freq: Three times a day (TID) | ORAL | Status: DC | PRN

## 2014-08-31 MED ORDER — ALUM & MAG HYDROXIDE-SIMETH 200-200-20 MG/5ML PO SUSP
30.0000 mL | Freq: Four times a day (QID) | ORAL | Status: DC | PRN
  Administered 2014-08-31: 30 mL via ORAL
  Filled 2014-08-31: qty 30

## 2014-08-31 NOTE — ED Provider Notes (Signed)
CSN: [id number]     Arrival date & time 08/31/14  0057 History   First MD Initiated Contact with Patient 08/31/14 [id number]     Chief Complaint  Patient presents with  . detox      (Consider location/radiation/quality/duration/timing/severity/associated sxs/prior Treatment) HPI  Past Medical History  Diagnosis Date  . Migraines   . Hypertension    History reviewed. No pertinent past surgical history. No family history on file. History  Substance Use Topics  . Smoking status: Current Every Day Smoker    Types: Cigarettes  . Smokeless tobacco: Not on file  . Alcohol Use: Yes    Review of Systems    Allergies  Review of patient's allergies indicates no known allergies.  Home Medications   Prior to Admission medications   Medication Sig Start Date End Date Taking? Authorizing Provider  buPROPion (WELLBUTRIN SR) 150 MG 12 hr tablet Take 150 mg by mouth 2 (two) times daily.   Yes Historical Provider, MD  buPROPion (WELLBUTRIN) 75 MG tablet Take 75 mg by mouth daily.   Yes Historical Provider, MD  hydrOXYzine (ATARAX/VISTARIL) 25 MG tablet Take 25 mg by mouth 3 (three) times daily as needed for anxiety.   Yes Historical Provider, MD  lisinopril-hydrochlorothiazide (PRINZIDE,ZESTORETIC) 10-12.5 MG per tablet Take 1 tablet by mouth daily.   Yes Historical Provider, MD  omeprazole (PRILOSEC) 20 MG capsule Take 20 mg by mouth daily.   Yes Historical Provider, MD  traZODone (DESYREL) 100 MG tablet Take 50 mg by mouth at bedtime as needed for sleep.   Yes Historical Provider, MD  verapamil (COVERA HS) 240 MG (CO) 24 hr tablet Take 240 mg by mouth at bedtime.   Yes Historical Provider, MD  sildenafil (VIAGRA) 100 MG tablet Take 100 mg by mouth daily as needed for erectile dysfunction.    Historical Provider, MD  SUMAtriptan (IMITREX) 50 MG tablet Take 50 mg by mouth every 2 (two) hours as needed. For migraine    Historical Provider, MD   BP 145/99 mmHg  Pulse 85  Temp(Src) 98.6 F (37  C) (Oral)  Resp 18  SpO2 98% Physical Exam  ED Course  Procedures (including critical care time) Labs Review Labs Reviewed - No data to display  Imaging Review No results found.   EKG Interpretation None      MDM   Final diagnoses:  Substance abuse   53yM with cocaine abuse. ETOH as well but currently with no clinical signs of significant withdrawal. Pt left ED and was not interested in resource list or willing to wait for DC paperwork.     Raeford RazorStephen Violet Cart, MD 08/31/14 [phone]

## 2014-08-31 NOTE — Progress Notes (Addendum)
[facility] INPATIENT:  Family/Significant Other Suicide Prevention Education  Suicide Prevention Education:  Patient Refusal for Family/Significant Other Suicide Prevention Education: The patient Alexander BailiffJohn Munoz has refused to provide written consent for family/significant other to be provided Family/Significant Other Suicide Prevention Education during admission and/or prior to discharge.  Patinent denies SI/HI.  Angeline SlimHill, Ashley M 08/31/2014, 8:26 PM

## 2014-08-31 NOTE — ED Notes (Signed)
Pt was seen earlier tonight for medical clearance and discharged  [facility] saying [facility]  Pt states he is here for detox from alcohol and crack cocaine

## 2014-08-31 NOTE — ED Notes (Signed)
Pt states he is trying to get clean from alcohol and crack  Pt states he last drank tonight around 9pm  Pt states he does not drink everyday and the amounts vary  Pt states last drug use was tonight

## 2014-08-31 NOTE — Progress Notes (Signed)
Scheduled medications administered per orders.  Trazodone administered prn for insomnia.  Vistaril administered prn for anxiety.  Maalox administered prn for heartburn/indigestion.  Patient was given a snack to eat before medications were taken.
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
## 2014-08-31 NOTE — Progress Notes (Signed)
[facility] The nurse has a patient to arrive from Psych ED shortly. Was told by nurse to hold the next to patients in Room 32 and 33 forty five minutes. And give forty five minutes between two patients. Will continue to monitor patient.

## 2014-08-31 NOTE — ED Notes (Signed)
Pt discharged  Asked pt to sign his discharge and pt states he is not going to do it  Pt was polite in his manner and walked out the door
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
## 2014-08-31 NOTE — BH Assessment (Signed)
[facility], and had declined OP resources.   Spoke with Dr. Juleen ChinaKohut to ask about pt's visit to ED. Dr. Juleen ChinaKohut had not been informed [facility] with plan to seek placement. He was [facility]able [facility].   [facility] and pt could potentially come after 8 am if accepted. Once labs, and prescreen are run.   [facility].    Clista BernhardtNancy Katie Faraone, [facility] Triage Specialist 08/31/2014 5:09 AM

## 2014-08-31 NOTE — Consult Note (Signed)
[place] Psychiatry Consult   Reason for Consult:  Cocaine/alcohol abuse Referring Physician:  EDP  Alexander Munoz is an 53 y.o. male. Total Time spent with patient: 45 minutes  Assessment: AXIS I:  Alcohol Abuse; cocaine dependence AXIS II:  Deferred AXIS III:   Past Medical History  Diagnosis Date  . Migraines   . Hypertension    AXIS IV:  other psychosocial or environmental problems, problems related to social environment and problems with primary support group AXIS V:  51-60 moderate symptoms  Plan:  Supportive therapy provided about ongoing stressors.  Subjective:   Alexander Munoz is a 53 y.o. male patient admitted to [facility] observation unit for stability.  HPI:  The patient came to the ED for medical clearance to go to [facility] but got triaged in psych.  He still wants to go to [facility] but missed this morning's admissions there.  Denies suicidal/homicidal ideations, hallucinations.  He drinks small amounts every other day, last detox was several years ago.  Alexander Munoz uses crack/cocaine (2 grams daily).  Cooperative and calm. HPI Elements:   Location:  generalized. Quality:  acute. Severity:  moderate. Timing:  intermittent. Duration:  few weeks. Context:  stressors.  Past Psychiatric History: Past Medical History  Diagnosis Date  . Migraines   . Hypertension     reports that he has been smoking Cigarettes.  He has been smoking about 0.00 packs per day. He does not have any smokeless tobacco history on file. He reports that he drinks alcohol. He reports that he uses illicit drugs (Cocaine). History reviewed. No pertinent family history.         Allergies:  No Known Allergies  ACT Assessment Complete:  Yes:    Educational Status    Risk to Self: Risk to self with the past 6 months Is patient at risk for suicide?: No, but patient needs Medical Clearance  Risk to Others:    Abuse:    Prior Inpatient Therapy:    Prior Outpatient Therapy:    Additional Information:                     Objective: Blood pressure 136/90, pulse 94, temperature 98.2 F (36.8 C), temperature source Oral, resp. rate 18, SpO2 97 %.There is no height or weight on file to calculate BMI. Results for orders placed or performed during the hospital encounter of 08/31/14 (from the past 72 hour(s))  CBC     Status: None   Collection Time: 08/31/14  5:44 AM  Result Value Ref Range   WBC 7.4 4.0 - 10.5 K/uL   RBC 5.08 4.22 - 5.81 MIL/uL   Hemoglobin 13.3 13.0 - 17.0 g/dL   HCT 40.2 39.0 - 52.0 %   MCV 79.1 78.0 - 100.0 fL   MCH 26.2 26.0 - 34.0 pg   MCHC 33.1 30.0 - 36.0 g/dL   RDW 14.5 11.5 - 15.5 %   Platelets 392 150 - 400 K/uL  Comprehensive metabolic panel     Status: Abnormal   Collection Time: 08/31/14  5:44 AM  Result Value Ref Range   Sodium 139 137 - 147 mEq/L   Potassium 4.0 3.7 - 5.3 mEq/L   Chloride 100 96 - 112 mEq/L   CO2 21 19 - 32 mEq/L   Glucose, Bld 74 70 - 99 mg/dL   BUN 14 6 - 23 mg/dL   Creatinine, Ser 1.34 0.50 - 1.35 mg/dL   Calcium 9.2 8.4 - 10.5 mg/dL   Total  Protein 7.8 6.0 - 8.3 g/dL   Albumin 4.2 3.5 - 5.2 g/dL   AST 21 0 - 37 U/L   ALT 19 0 - 53 U/L   Alkaline Phosphatase 94 39 - 117 U/L   Total Bilirubin 0.3 0.3 - 1.2 mg/dL   GFR calc non Af Amer 59 (L) >90 mL/min   GFR calc Af Amer 68 (L) >90 mL/min    Comment: (NOTE) The eGFR has been calculated using the CKD EPI equation. This calculation has not been validated in all clinical situations. eGFR's persistently <90 mL/min signify possible Chronic Kidney Disease.    Anion gap 18 (H) 5 - 15  Ethanol (ETOH)     Status: Abnormal   Collection Time: 08/31/14  5:44 AM  Result Value Ref Range   Alcohol, Ethyl (B) 16 (H) 0 - 11 mg/dL    Comment:        LOWEST DETECTABLE LIMIT FOR SERUM ALCOHOL IS 11 mg/dL FOR MEDICAL PURPOSES ONLY   Urine Drug Screen     Status: Abnormal   Collection Time: 08/31/14  5:47 AM  Result Value Ref Range   Opiates NONE DETECTED NONE DETECTED    Cocaine POSITIVE (A) NONE DETECTED   Benzodiazepines NONE DETECTED NONE DETECTED   Amphetamines NONE DETECTED NONE DETECTED   Tetrahydrocannabinol NONE DETECTED NONE DETECTED   Barbiturates NONE DETECTED NONE DETECTED    Comment:        DRUG SCREEN FOR MEDICAL PURPOSES ONLY.  IF CONFIRMATION IS NEEDED FOR ANY PURPOSE, NOTIFY LAB WITHIN 5 DAYS.        LOWEST DETECTABLE LIMITS FOR URINE DRUG SCREEN Drug Class       Cutoff (ng/mL) Amphetamine      1000 Barbiturate      200 Benzodiazepine   681 Tricyclics       275 Opiates          300 Cocaine          300 THC              50   Urinalysis, Routine w reflex microscopic     Status: Abnormal   Collection Time: 08/31/14  5:47 AM  Result Value Ref Range   Color, Urine YELLOW YELLOW   APPearance CLEAR CLEAR   Specific Gravity, Urine 1.034 (H) 1.005 - 1.030   pH 5.5 5.0 - 8.0   Glucose, UA NEGATIVE NEGATIVE mg/dL   Hgb urine dipstick NEGATIVE NEGATIVE   Bilirubin Urine NEGATIVE NEGATIVE   Ketones, ur 15 (A) NEGATIVE mg/dL   Protein, ur 30 (A) NEGATIVE mg/dL   Urobilinogen, UA 1.0 0.0 - 1.0 mg/dL   Nitrite NEGATIVE NEGATIVE   Leukocytes, UA NEGATIVE NEGATIVE  Urine microscopic-add on     Status: None   Collection Time: 08/31/14  5:47 AM  Result Value Ref Range   WBC, UA 0-2 <3 WBC/hpf    Comment: 0-2   Urine-Other MUCOUS PRESENT     Comment: MUCOUS PRESENT   Labs are reviewed and are pertinent for no medical issues.  No current facility-administered medications for this encounter.   Current Outpatient Prescriptions  Medication Sig Dispense Refill  . buPROPion (WELLBUTRIN SR) 150 MG 12 hr tablet Take 150 mg by mouth 2 (two) times daily.    Marland Kitchen buPROPion (WELLBUTRIN) 75 MG tablet Take 75 mg by mouth daily.    . hydrOXYzine (ATARAX/VISTARIL) 25 MG tablet Take 25 mg by mouth 3 (three) times daily as needed for anxiety.    Marland Kitchen  lisinopril-hydrochlorothiazide (PRINZIDE,ZESTORETIC) 10-12.5 MG per tablet Take 1 tablet by mouth daily.     Marland Kitchen omeprazole (PRILOSEC) 20 MG capsule Take 20 mg by mouth daily.    . traZODone (DESYREL) 100 MG tablet Take 50 mg by mouth at bedtime as needed for sleep.    . verapamil (COVERA HS) 240 MG (CO) 24 hr tablet Take 240 mg by mouth at bedtime.    . sildenafil (VIAGRA) 100 MG tablet Take 100 mg by mouth daily as needed for erectile dysfunction.    . SUMAtriptan (IMITREX) 50 MG tablet Take 50 mg by mouth every 2 (two) hours as needed. For migraine      Psychiatric Specialty Exam:     Blood pressure 136/90, pulse 94, temperature 98.2 F (36.8 C), temperature source Oral, resp. rate 18, SpO2 97 %.There is no height or weight on file to calculate BMI.  General Appearance: Casual  Eye Contact::  Good  Speech:  Normal Rate  Volume:  Normal  Mood:  Euthymic  Affect:  Congruent  Thought Process:  Coherent  Orientation:  Full (Time, Place, and Person)  Thought Content:  WDL  Suicidal Thoughts:  No  Homicidal Thoughts:  No  Memory:  Immediate;   Fair Recent;   Fair Remote;   Fair  Judgement:  Fair  Insight:  Fair  Psychomotor Activity:  Normal  Concentration:  Fair  Recall:  [employer] of Knowledge:Fair  Language: Fair  Akathisia:  No  Handed:  Right  AIMS (if indicated):     Assets:  Leisure Time Physical Health Resilience  Sleep:      Musculoskeletal: Strength & Muscle Tone: within normal limits Gait & Station: normal Patient leans: N/A  Treatment Plan Summary: Daily contact with patient to assess and evaluate symptoms and progress in treatment Medication management; admit to [facility] observation unit for stability.  Waylan Boga, [place] 08/31/2014 1:48 PM  Patient seen, evaluated and I agree with notes by Nurse Practitioner. Corena Pilgrim, MD

## 2014-08-31 NOTE — ED Provider Notes (Signed)
CSN: [id number]     Arrival date & time 08/31/14  [id number] History   First MD Initiated Contact with Patient 08/31/14 0540     Chief Complaint  Patient presents with  . Medical Clearance     (Consider location/radiation/quality/duration/timing/severity/associated sxs/prior Treatment) HPI   53 year old male with cocaine and alcohol abuse. Long-standing history of same. Requesting help. He has no suicidal homicidal ideation. Hallucinations. Celsius is tired of living this lifelong like to quit. Has no acute complaints otherwise.  Past Medical History  Diagnosis Date  . Migraines   . Hypertension    History reviewed. No pertinent past surgical history. No family history on file. History  Substance Use Topics  . Smoking status: Current Every Day Smoker    Types: Cigarettes  . Smokeless tobacco: Not on file  . Alcohol Use: Yes    Review of Systems  All systems reviewed and negative, other than as noted in HPI.   Allergies  Review of patient's allergies indicates no known allergies.  Home Medications   Prior to Admission medications   Medication Sig Start Date End Date Taking? Authorizing Provider  buPROPion (WELLBUTRIN SR) 150 MG 12 hr tablet Take 150 mg by mouth 2 (two) times daily.   Yes Historical Provider, MD  buPROPion (WELLBUTRIN) 75 MG tablet Take 75 mg by mouth daily.   Yes Historical Provider, MD  hydrOXYzine (ATARAX/VISTARIL) 25 MG tablet Take 25 mg by mouth 3 (three) times daily as needed for anxiety.   Yes Historical Provider, MD  lisinopril-hydrochlorothiazide (PRINZIDE,ZESTORETIC) 10-12.5 MG per tablet Take 1 tablet by mouth daily.   Yes Historical Provider, MD  omeprazole (PRILOSEC) 20 MG capsule Take 20 mg by mouth daily.   Yes Historical Provider, MD  traZODone (DESYREL) 100 MG tablet Take 50 mg by mouth at bedtime as needed for sleep.   Yes Historical Provider, MD  verapamil (COVERA HS) 240 MG (CO) 24 hr tablet Take 240 mg by mouth at bedtime.   Yes Historical  Provider, MD  sildenafil (VIAGRA) 100 MG tablet Take 100 mg by mouth daily as needed for erectile dysfunction.    Historical Provider, MD  SUMAtriptan (IMITREX) 50 MG tablet Take 50 mg by mouth every 2 (two) hours as needed. For migraine    Historical Provider, MD   BP 160/107 mmHg  Pulse 82  Temp(Src) 98.2 F (36.8 C) (Oral)  Resp 20  SpO2 96% Physical Exam  Constitutional: He is oriented to person, place, and time. He appears well-developed and well-nourished. No distress.  HENT:  Head: Normocephalic and atraumatic.  Eyes: Conjunctivae are normal. Right eye exhibits no discharge. Left eye exhibits no discharge.  Neck: Neck supple.  Cardiovascular: Normal rate, regular rhythm and normal heart sounds.  Exam reveals no gallop and no friction rub.   No murmur heard. Pulmonary/Chest: Effort normal and breath sounds normal. No respiratory distress.  Abdominal: Soft. He exhibits no distension. There is no tenderness.  Musculoskeletal: He exhibits no edema or tenderness.  Neurological: He is alert and oriented to person, place, and time. No cranial nerve deficit. He exhibits normal muscle tone. Coordination normal.  Skin: Skin is warm and dry.  Psychiatric: He has a normal mood and affect. His behavior is normal. Thought content normal.  Speech clear. Content appropriate. Follows commands. Good insight. Does not appear to responding to internal stimuli.  Nursing note and vitals reviewed.   ED Course  Procedures (including critical care time) Labs Review Labs Reviewed  COMPREHENSIVE METABOLIC PANEL - Abnormal;  Notable for the following:    GFR calc non Af Amer 59 (*)    GFR calc Af Amer 68 (*)    Anion gap 18 (*)    All other components within normal limits  ETHANOL - Abnormal; Notable for the following:    Alcohol, Ethyl (B) 16 (*)    All other components within normal limits  URINE RAPID DRUG SCREEN (HOSP PERFORMED) - Abnormal; Notable for the following:    Cocaine POSITIVE (*)     All other components within normal limits  URINALYSIS, ROUTINE W REFLEX MICROSCOPIC - Abnormal; Notable for the following:    Specific Gravity, Urine 1.034 (*)    Ketones, ur 15 (*)    Protein, ur 30 (*)    All other components within normal limits  CBC  URINE MICROSCOPIC-ADD ON    Imaging Review No results found.   EKG Interpretation None      MDM   Final diagnoses:  Cocaine dependence without complication  Alcohol abuse   53 year old male polysubstance abuse. No suicidal homicidal ideation. Not psychotic. Will be discharged with outpatient resources.  Raeford RazorStephen Yeraldine Forney, MD 09/09/14 [phone]

## 2014-08-31 NOTE — BH Assessment (Signed)
Relayed results of assessment to Janann Augustori Burkett, NP. Per Brett Albinoori, NP pt meets criteria. TTS to seek placement once medically cleared.   Informed Beverely RisenJennifer Charge RN of pt pending arrival.   Clista Bernhardtancy Nazaire Cordial, [facility] Triage Specialist 08/31/2014 12:39 AM

## 2014-08-31 NOTE — BH Assessment (Signed)
[facility]; voluntary consent for treatment signed by patient and [facility]. Copy placed in patient's shadow file.  Carney Bernatherine C Harrill, LCSW

## 2014-08-31 NOTE — ED Notes (Signed)
MD at bedside. 

## 2014-08-31 NOTE — Progress Notes (Signed)
Patient is stable at discharge. [facility] [employer] [facility].

## 2014-09-01 DIAGNOSIS — F1019 Alcohol abuse with unspecified alcohol-induced disorder: Secondary | ICD-10-CM

## 2014-09-01 DIAGNOSIS — F332 Major depressive disorder, recurrent severe without psychotic features: Secondary | ICD-10-CM | POA: Insufficient documentation

## 2014-09-01 DIAGNOSIS — F1420 Cocaine dependence, uncomplicated: Secondary | ICD-10-CM

## 2014-09-01 MED ORDER — LISINOPRIL-HYDROCHLOROTHIAZIDE 10-12.5 MG PO TABS: 1.0000 | ORAL_TABLET | Freq: Every day | ORAL | Status: DC

## 2014-09-01 MED ORDER — OMEPRAZOLE 20 MG PO CPDR: 20.0000 mg | DELAYED_RELEASE_CAPSULE | Freq: Every day | ORAL | Status: DC

## 2014-09-01 MED ORDER — BUPROPION HCL 75 MG PO TABS: 75.0000 mg | ORAL_TABLET | Freq: Every day | ORAL | Status: DC

## 2014-09-01 MED ORDER — VERAPAMIL HCL 240 MG (CO) PO TB24: 240.0000 mg | ORAL_TABLET | Freq: Every day | ORAL | Status: DC

## 2014-09-01 NOTE — Progress Notes (Signed)
Patient ID: Alexander BailiffJohn Munoz, male   DOB: 11/09/1960, 53 y.o.   MRN: [id number] Discharge Note-Tom H coordinated aftercare plans for him with [facility]. He has his own car that he has been living in and [facility] to see what is going on, states he has gotten conflicting stories from different agencies. Reviewed with him his discharge orders and Renata Capriceonrad NP gave him one Rx for Wellbutrin though he says he has his medications and doesn't need the Rx he did take it. All of his property was returned to him. He verbalized his understanding of his discharge orders. He did [facility]. He is not complaining of any withdrawal sx.and is not dangerous to self or others.
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
## 2014-09-01 NOTE — Discharge Instructions (Signed)
To help you maintain a sober lifestyle, a substance abuse treatment facility or program may be beneficial to you.  Consider contacting one of the following facilities for residential rehabilitation:       [facility]      [address]      [place], [place] [address]      [phone]       [facility]      [address]      [place], [place] [address]      [phone]

## 2014-09-01 NOTE — Progress Notes (Signed)
Patient ID: Alexander Munoz, male   DOB: 02-Oct-1961, 53 y.o.   MRN: [id number] States slept well last night ad continues to sleep this am. Awakened to give 9 am medications, both he had been taking prior to this admission. He is pleasant. He denies any sx of withdrawal. He states he [facility] and states that is what he planned for yesterday but for some reason he was sent here to OBS. The note from the ED provider indicates he missed the [facility] yesterday. Seen this am by Alexander Capriceonrad NP and he states he will have Alexander Munoz the disspostion coordinator help him get into rehab today or give him referrals to follow up on if he cant get in today and will be discharged this pm from OBS..Marland Kitchen

## 2014-09-01 NOTE — Plan of Care (Addendum)
[facility] Observation Crisis Plan  Reason for Crisis Plan:  Substance Abuse   Plan of Care:  Referral for Substance Abuse  Family Support:    None  Current Living Environment:   Pt lives with a friend; when asked if he can return to the household, pt replies, "Not really."  Insurance:  VA benefits Hospital Account    Name Acct ID Class Status Primary Coverage   Molinda BailiffMcIntosh, Clarance [id number] BEHAVIORAL HEALTH OBSERVATION Open [employer] - [employer]        Guarantor Account (for Hospital Account [id number])    Name Relation to Pt Service Area Active? Acct Type   Molinda BailiffMcIntosh, Varnell Self [facility] Yes Behavioral Health   Address Phone       [address] [place], [place] [address] [phone])          Coverage Information (for Hospital Account [id number])    F/O Payor/Plan Precert #   V[employer]/V[employer]ERAN'S ADMINISTRATION    Subscriber Subscriber #   Molinda BailiffMcIntosh, Keena [phone]   Address Phone   [address] [place], [place] [address] [phone]      Legal Guardian:   Self  Primary Care Provider:  PROVIDER NOT IN SYSTEM; [facility] [place]  Current Outpatient Providers:  None  Psychiatrist:   None  Counselor/Therapist:   None  Compliant with Medications:  No; off medications for the past 2 - 3 weeks while binging.  Additional Information: After consulting with Claudette Headonrad Withrow, NP it has been determined that pt does not present a life threatening danger to himself or others, and that psychiatric hospitalization is not indicated for him at this time.  He would, however, benefit from admission to a substance abuse rehabilitation facility or program.  He reports that there is [facility].  Pt has signed Consent to Release Information to [facility], [facility], and [facility] to pursue referral.  At 10:52 I called [facility] at [facility]; call rolled to voice mail and I left a  message.  I also spoke to [facility]'s main phone number and was told that they are not aware of a patient by this name on their wait list.  At 11:02 I spoke to St[facility]Greg [facility]; he reports that their residential rehabilitation program has a wait list that is months long.  At 11:27 I called [facility] and was transferred to Shelle IronJeff Aldridge; call rolled to voice mail and I left a message.  I also called the [facility] program at 10:59 and left a voice mail message for Olegario ShearerLandon that did not include confidential patient information.  As of this writing I am awaiting calls back for all of the messages that I left.  At 11:15 I also spoke to Pearson Forsterick Andrews at [facility], again providing no confidential patient information.   He indicates that openings are available at this program.  This option will be discussed with pt.   Raphael GibneyHughes, Gelila Well Patrick 11/3/201511:44 AM   Addendum: At no time today did [facility] call back.  After closer review of notes in pt's chart, it was found that the clinician working with him had contacted [facility], and they reported that they had beds available.  However, pt had not been accepted to their facility.  Moreover, their available beds were for detox, which the pt does not need per [facility].  Around 14:50 Melissa at [facility] called me back, indicating that they do not have rehab  beds available today, but that they may tomorrow or the following day.  After discussing the various options with the pt, he is not interested in a long term program such as [facility] or [facility].  He wants a shorter term program, after which he will follow up on his own with [facility].  When I told pt that neither [facility] nor [facility] (with shorter term programs) would have beds available today, but that [facility] may tomorrow, pt opted to leave the Observation Unit with referral information to follow up on his own.  This was discussed with Renata Capriceonrad who agrees to this plan.  Pt was  given referral information for both [facility] and [facility].  He was not interested in outpatient treatment programs.  He did not need information about [facility] given that he already has their contact information.  Doylene Canninghomas Lorre Opdahl, MA Triage Specialist 09/01/2014 @ 17:42
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
## 2014-09-01 NOTE — Progress Notes (Signed)
D: Patient resting in bed with eyes closed.  Respirations even and unlabored.  Patient appears to be in no apparent distress. A: Staff to monitor Q 15 mins for safety.   R:Patient remains safe on the unit.  

## 2014-09-09 NOTE — ED Provider Notes (Signed)
53yM with drug abuse. I was not aware that [facility]. Remains w/o SI/HI or psychosis. Will medically clear.  Raeford RazorStephen Mahmood Boehringer, MD 09/09/14 [phone]

## 2014-12-07 ENCOUNTER — Emergency Department (HOSPITAL_COMMUNITY)
Admission: EM | Admit: 2014-12-07 | Discharge: 2014-12-07 | Disposition: A | Discharge status: Home / Self Care | Payer: Non-veteran care | Attending: Emergency Medicine | Admitting: Emergency Medicine

## 2014-12-07 ENCOUNTER — Encounter (HOSPITAL_COMMUNITY): Payer: Self-pay | Admitting: Emergency Medicine

## 2014-12-07 DIAGNOSIS — Z79899 Other long term (current) drug therapy: Secondary | ICD-10-CM | POA: Insufficient documentation

## 2014-12-07 DIAGNOSIS — Z720 Tobacco use: Secondary | ICD-10-CM | POA: Diagnosis not present

## 2014-12-07 DIAGNOSIS — G43909 Migraine, unspecified, not intractable, without status migrainosus: Secondary | ICD-10-CM

## 2014-12-07 DIAGNOSIS — I10 Essential (primary) hypertension: Secondary | ICD-10-CM | POA: Insufficient documentation

## 2014-12-07 DIAGNOSIS — R51 Headache: Secondary | ICD-10-CM | POA: Diagnosis present

## 2014-12-07 HISTORY — DX: Anxiety disorder, unspecified: F41.9

## 2014-12-07 HISTORY — DX: Depression, unspecified: F32.A

## 2014-12-07 HISTORY — DX: Major depressive disorder, single episode, unspecified: F32.9

## 2014-12-07 MED ORDER — KETOROLAC TROMETHAMINE 30 MG/ML IJ SOLN
30.0000 mg | Freq: Once | INTRAMUSCULAR | Status: AC
  Administered 2014-12-07: 30 mg via INTRAVENOUS
  Filled 2014-12-07: qty 1

## 2014-12-07 MED ORDER — PROCHLORPERAZINE EDISYLATE 5 MG/ML IJ SOLN
10.0000 mg | Freq: Once | INTRAMUSCULAR | Status: AC
  Administered 2014-12-07: 10 mg via INTRAVENOUS
  Filled 2014-12-07: qty 2

## 2014-12-07 MED ORDER — SODIUM CHLORIDE 0.9 % IV BOLUS (SEPSIS)
1000.0000 mL | Freq: Once | INTRAVENOUS | Status: AC
  Administered 2014-12-07: 1000 mL via INTRAVENOUS

## 2014-12-07 MED ORDER — DIPHENHYDRAMINE HCL 50 MG/ML IJ SOLN
25.0000 mg | Freq: Once | INTRAMUSCULAR | Status: AC
  Administered 2014-12-07: 25 mg via INTRAVENOUS
  Filled 2014-12-07: qty 1

## 2014-12-07 NOTE — ED Notes (Signed)
Pt reports intermittent migraine for a week with new onset nausea within past 24 hours.

## 2014-12-07 NOTE — Discharge Instructions (Signed)
Return here as needed.  Follow-up with your primary care doctor °

## 2014-12-07 NOTE — ED Provider Notes (Signed)
CSN: [id number]     Arrival date & time 12/07/14  [id number] History   First MD Initiated Contact with Patient 12/07/14 1109     Chief Complaint  Patient presents with  . Migraine     (Consider location/radiation/quality/duration/timing/severity/associated sxs/prior Treatment) HPI Comments: Light makes the headache worse  Patient is a 54 y.o. male presenting with migraines. The history is provided by the patient.  Migraine This is a recurrent problem. The current episode started in the past 7 days. The problem occurs constantly. The problem has been unchanged. Associated symptoms include headaches and nausea. Pertinent negatives include no abdominal pain, anorexia, arthralgias, change in bowel habit, chest pain, chills, congestion, coughing, diaphoresis, fatigue, fever, joint swelling, myalgias, neck pain, numbness, rash, sore throat, swollen glands, urinary symptoms, vertigo, visual change, vomiting or weakness. Nothing aggravates the symptoms. He has tried nothing for the symptoms. The treatment provided no relief.    Past Medical History  Diagnosis Date  . Migraines   . Hypertension    No past surgical history on file. No family history on file. History  Substance Use Topics  . Smoking status: Current Every Day Smoker -- 1.00 packs/day    Types: Cigarettes  . Smokeless tobacco: Not on file  . Alcohol Use: Yes    Review of Systems  Constitutional: Negative for fever, chills, diaphoresis and fatigue.  HENT: Negative for congestion and sore throat.   Eyes: Negative for photophobia, pain and visual disturbance.  Respiratory: Negative for cough.   Cardiovascular: Negative for chest pain.  Gastrointestinal: Positive for nausea. Negative for vomiting, abdominal pain, anorexia and change in bowel habit.  Endocrine: Negative for polydipsia, polyphagia and polyuria.  Genitourinary: Negative for dysuria.  Musculoskeletal: Negative for myalgias, joint swelling, arthralgias and neck pain.   Skin: Negative for rash.  Allergic/Immunologic: Negative for immunocompromised state.  Neurological: Positive for headaches. Negative for vertigo, weakness, light-headedness and numbness.  Psychiatric/Behavioral: Negative for hallucinations.      Allergies  Review of patient's allergies indicates no known allergies.  Home Medications   Prior to Admission medications   Medication Sig Start Date End Date Taking? Authorizing Provider  buPROPion (WELLBUTRIN) 75 MG tablet Take 1 tablet (75 mg total) by mouth daily. Patient taking differently: Take 150 mg by mouth daily.  09/01/14  Yes Beau FannyJohn C Withrow, FNP  hydrOXYzine (ATARAX/VISTARIL) 25 MG tablet Take 25 mg by mouth 3 (three) times daily as needed for anxiety.   Yes Historical Provider, MD  lisinopril-hydrochlorothiazide (PRINZIDE,ZESTORETIC) 10-12.5 MG per tablet Take 1 tablet by mouth daily. 09/01/14  Yes Beau FannyJohn C Withrow, FNP  omeprazole (PRILOSEC) 20 MG capsule Take 1 capsule (20 mg total) by mouth daily. 09/01/14  Yes Beau FannyJohn C Withrow, FNP  SUMAtriptan (IMITREX) 50 MG tablet Take 50 mg by mouth every 2 (two) hours as needed. For migraine   Yes Historical Provider, MD  traZODone (DESYREL) 100 MG tablet Take 50 mg by mouth at bedtime as needed for sleep.   Yes Historical Provider, MD  verapamil (COVERA HS) 240 MG (CO) 24 hr tablet Take 1 tablet (240 mg total) by mouth at bedtime. 09/01/14  Yes Beau FannyJohn C Withrow, FNP  sildenafil (VIAGRA) 100 MG tablet Take 100 mg by mouth daily as needed for erectile dysfunction.    Historical Provider, MD   BP 139/102 mmHg  Pulse 83  Temp(Src) 98.2 F (36.8 C) (Oral)  Resp 18  SpO2 99% Physical Exam  Constitutional: He is oriented to person, place, and time. He appears well-developed  and well-nourished. No distress.  HENT:  Head: Normocephalic and atraumatic.  Mouth/Throat: Oropharynx is clear and moist.  Eyes: Pupils are equal, round, and reactive to light.  Neck: Normal range of motion. Neck supple.   Cardiovascular: Normal rate, regular rhythm and normal heart sounds.  Exam reveals no gallop and no friction rub.   No murmur heard. Pulmonary/Chest: Effort normal and breath sounds normal.  Musculoskeletal: Normal range of motion. He exhibits no edema.  Neurological: He is alert and oriented to person, place, and time. He exhibits normal muscle tone. Coordination normal.  Skin: Skin is warm and dry. No rash noted. No erythema.  Psychiatric: He has a normal mood and affect. His behavior is normal.  Nursing note and vitals reviewed.   ED Course  Procedures (including critical care time) Patient has complete resolution of his headache following IV fluids, Toradol, Compazine and Benadryl.  Patient is advised return here as needed.  Told to follow-up with his primary care doctor  MDM   Final diagnoses:  None        Carlyle Dolly, PA-C 12/07/14 1359  Nelia Shi, MD 12/08/14 [phone]

## 2014-12-07 NOTE — ED Notes (Signed)
Pt c/o migraine x 1 wk w/ vomiting and sensitivity to light and sound.  Pt states this feels like his typical migraine.

## 2015-09-06 ENCOUNTER — Ambulatory Visit (HOSPITAL_COMMUNITY)
Admission: RE | Admit: 2015-09-06 | Discharge: 2015-09-06 | Disposition: A | Discharge status: Home / Self Care | Payer: Non-veteran care | Attending: Psychiatry | Admitting: Psychiatry

## 2015-09-06 ENCOUNTER — Encounter (HOSPITAL_COMMUNITY): Payer: Self-pay | Admitting: *Deleted
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
## 2015-09-07 ENCOUNTER — Inpatient Hospital Stay (HOSPITAL_COMMUNITY)
Admission: AD | Admit: 2015-09-07 | Discharge: 2015-09-09 | DRG: 885 | Disposition: A | Discharge status: Home / Self Care | Payer: Medicare Other | Source: Intra-hospital | Attending: Psychiatry | Admitting: Psychiatry

## 2015-09-07 ENCOUNTER — Encounter (HOSPITAL_COMMUNITY): Payer: Self-pay | Admitting: Emergency Medicine

## 2015-09-07 ENCOUNTER — Emergency Department (HOSPITAL_COMMUNITY)
Admission: EM | Admit: 2015-09-07 | Discharge: 2015-09-07 | Disposition: A | Discharge status: Other Acute Inpatient Hospital | Payer: Medicare Other | Attending: Emergency Medicine | Admitting: Emergency Medicine

## 2015-09-07 ENCOUNTER — Encounter (HOSPITAL_COMMUNITY): Payer: Self-pay

## 2015-09-07 DIAGNOSIS — I10 Essential (primary) hypertension: Secondary | ICD-10-CM | POA: Insufficient documentation

## 2015-09-07 DIAGNOSIS — F1420 Cocaine dependence, uncomplicated: Secondary | ICD-10-CM | POA: Diagnosis present

## 2015-09-07 DIAGNOSIS — F329 Major depressive disorder, single episode, unspecified: Secondary | ICD-10-CM | POA: Insufficient documentation

## 2015-09-07 DIAGNOSIS — F332 Major depressive disorder, recurrent severe without psychotic features: Secondary | ICD-10-CM | POA: Diagnosis present

## 2015-09-07 DIAGNOSIS — R45851 Suicidal ideations: Secondary | ICD-10-CM | POA: Diagnosis present

## 2015-09-07 DIAGNOSIS — K219 Gastro-esophageal reflux disease without esophagitis: Secondary | ICD-10-CM | POA: Diagnosis present

## 2015-09-07 DIAGNOSIS — F32A Depression, unspecified: Secondary | ICD-10-CM

## 2015-09-07 DIAGNOSIS — G43909 Migraine, unspecified, not intractable, without status migrainosus: Secondary | ICD-10-CM | POA: Insufficient documentation

## 2015-09-07 DIAGNOSIS — G4700 Insomnia, unspecified: Secondary | ICD-10-CM | POA: Diagnosis present

## 2015-09-07 DIAGNOSIS — Z008 Encounter for other general examination: Secondary | ICD-10-CM | POA: Diagnosis present

## 2015-09-07 DIAGNOSIS — F1410 Cocaine abuse, uncomplicated: Secondary | ICD-10-CM | POA: Insufficient documentation

## 2015-09-07 DIAGNOSIS — F1910 Other psychoactive substance abuse, uncomplicated: Secondary | ICD-10-CM

## 2015-09-07 DIAGNOSIS — F17210 Nicotine dependence, cigarettes, uncomplicated: Secondary | ICD-10-CM | POA: Diagnosis present

## 2015-09-07 DIAGNOSIS — Z8639 Personal history of other endocrine, nutritional and metabolic disease: Secondary | ICD-10-CM | POA: Insufficient documentation

## 2015-09-07 DIAGNOSIS — Z79899 Other long term (current) drug therapy: Secondary | ICD-10-CM | POA: Insufficient documentation

## 2015-09-07 DIAGNOSIS — Z720 Tobacco use: Secondary | ICD-10-CM | POA: Insufficient documentation

## 2015-09-07 DIAGNOSIS — F419 Anxiety disorder, unspecified: Secondary | ICD-10-CM | POA: Diagnosis not present

## 2015-09-07 DIAGNOSIS — F4310 Post-traumatic stress disorder, unspecified: Secondary | ICD-10-CM | POA: Diagnosis present

## 2015-09-07 HISTORY — DX: Gastro-esophageal reflux disease without esophagitis: K21.9

## 2015-09-07 HISTORY — DX: Lactose intolerance, unspecified: E73.9

## 2015-09-07 LAB — COMPREHENSIVE METABOLIC PANEL
ALK PHOS: 68 U/L (ref 38–126)
ALT: 21 U/L (ref 17–63)
ANION GAP: 7 (ref 5–15)
AST: 23 U/L (ref 15–41)
Albumin: 4.3 g/dL (ref 3.5–5.0)
BUN: 10 mg/dL (ref 6–20)
CALCIUM: 9.2 mg/dL (ref 8.9–10.3)
CHLORIDE: 104 mmol/L (ref 101–111)
CO2: 24 mmol/L (ref 22–32)
Creatinine, Ser: 1.11 mg/dL (ref 0.61–1.24)
Glucose, Bld: 106 mg/dL — ABNORMAL HIGH (ref 65–99)
Potassium: 3.7 mmol/L (ref 3.5–5.1)
SODIUM: 135 mmol/L (ref 135–145)
Total Bilirubin: 0.8 mg/dL (ref 0.3–1.2)
Total Protein: 7.6 g/dL (ref 6.5–8.1)

## 2015-09-07 LAB — CBC
HCT: 37.2 % — ABNORMAL LOW (ref 39.0–52.0)
HEMOGLOBIN: 12.9 g/dL — AB (ref 13.0–17.0)
MCH: 27.2 pg (ref 26.0–34.0)
MCHC: 34.7 g/dL (ref 30.0–36.0)
MCV: 78.3 fL (ref 78.0–100.0)
PLATELETS: 430 10*3/uL — AB (ref 150–400)
RBC: 4.75 MIL/uL (ref 4.22–5.81)
RDW: 14.7 % (ref 11.5–15.5)
WBC: 11 10*3/uL — ABNORMAL HIGH (ref 4.0–10.5)

## 2015-09-07 LAB — RAPID URINE DRUG SCREEN, HOSP PERFORMED
Amphetamines: NOT DETECTED
Barbiturates: NOT DETECTED
Benzodiazepines: NOT DETECTED
COCAINE: POSITIVE — AB
OPIATES: NOT DETECTED
TETRAHYDROCANNABINOL: NOT DETECTED

## 2015-09-07 LAB — ACETAMINOPHEN LEVEL

## 2015-09-07 LAB — SALICYLATE LEVEL

## 2015-09-07 LAB — ETHANOL

## 2015-09-07 MED ORDER — LORAZEPAM 2 MG/ML IJ SOLN: 0.0000 mg | Freq: Two times a day (BID) | INTRAMUSCULAR | Status: DC

## 2015-09-07 MED ORDER — THIAMINE HCL 100 MG/ML IJ SOLN: 100.0000 mg | Freq: Every day | INTRAMUSCULAR | Status: DC

## 2015-09-07 MED ORDER — VITAMIN B-1 100 MG PO TABS: 100.0000 mg | ORAL_TABLET | Freq: Every day | ORAL | Status: DC

## 2015-09-07 MED ORDER — VERAPAMIL HCL ER 240 MG PO TBCR
240.0000 mg | EXTENDED_RELEASE_TABLET | Freq: Every day | ORAL | Status: DC
  Administered 2015-09-07 – 2015-09-08 (×2): 240 mg via ORAL
  Filled 2015-09-07 (×4): qty 1

## 2015-09-07 MED ORDER — BUPROPION HCL 75 MG PO TABS
150.0000 mg | ORAL_TABLET | Freq: Every day | ORAL | Status: DC
  Administered 2015-09-07 – 2015-09-09 (×3): 150 mg via ORAL
  Filled 2015-09-07 (×6): qty 2

## 2015-09-07 MED ORDER — MAGNESIUM HYDROXIDE 400 MG/5ML PO SUSP: 30.0000 mL | Freq: Every day | ORAL | Status: DC | PRN

## 2015-09-07 MED ORDER — ONDANSETRON HCL 4 MG PO TABS: 4.0000 mg | ORAL_TABLET | Freq: Three times a day (TID) | ORAL | Status: DC | PRN

## 2015-09-07 MED ORDER — ACETAMINOPHEN 325 MG PO TABS
650.0000 mg | ORAL_TABLET | Freq: Four times a day (QID) | ORAL | Status: DC | PRN
  Administered 2015-09-07 – 2015-09-08 (×2): 650 mg via ORAL
  Filled 2015-09-07 (×2): qty 2

## 2015-09-07 MED ORDER — LISINOPRIL 10 MG PO TABS
10.0000 mg | ORAL_TABLET | Freq: Every day | ORAL | Status: DC
  Administered 2015-09-07 – 2015-09-09 (×3): 10 mg via ORAL
  Filled 2015-09-07 (×5): qty 1
  Filled 2015-09-07: qty 2

## 2015-09-07 MED ORDER — ZOLPIDEM TARTRATE 5 MG PO TABS: 5.0000 mg | ORAL_TABLET | Freq: Every evening | ORAL | Status: DC | PRN

## 2015-09-07 MED ORDER — HYDROXYZINE HCL 25 MG PO TABS
25.0000 mg | ORAL_TABLET | Freq: Three times a day (TID) | ORAL | Status: DC | PRN
  Administered 2015-09-07 – 2015-09-08 (×2): 25 mg via ORAL
  Filled 2015-09-07 (×2): qty 1

## 2015-09-07 MED ORDER — LORAZEPAM 2 MG/ML IJ SOLN: 0.0000 mg | Freq: Four times a day (QID) | INTRAMUSCULAR | Status: DC

## 2015-09-07 MED ORDER — PANTOPRAZOLE SODIUM 40 MG PO TBEC
40.0000 mg | DELAYED_RELEASE_TABLET | Freq: Every day | ORAL | Status: DC
  Administered 2015-09-07 – 2015-09-09 (×3): 40 mg via ORAL
  Filled 2015-09-07 (×6): qty 1

## 2015-09-07 MED ORDER — ACETAMINOPHEN 325 MG PO TABS
650.0000 mg | ORAL_TABLET | ORAL | Status: DC | PRN
  Administered 2015-09-07: 650 mg via ORAL
  Filled 2015-09-07: qty 2

## 2015-09-07 MED ORDER — PNEUMOCOCCAL VAC POLYVALENT 25 MCG/0.5ML IJ INJ
0.5000 mL | INJECTION | INTRAMUSCULAR | Status: AC
  Administered 2015-09-08: 0.5 mL via INTRAMUSCULAR

## 2015-09-07 MED ORDER — TRAZODONE HCL 50 MG PO TABS
50.0000 mg | ORAL_TABLET | Freq: Every evening | ORAL | Status: DC | PRN
  Administered 2015-09-07 – 2015-09-08 (×2): 50 mg via ORAL
  Filled 2015-09-07 (×2): qty 1

## 2015-09-07 MED ORDER — LORAZEPAM 1 MG PO TABS: 1.0000 mg | ORAL_TABLET | Freq: Three times a day (TID) | ORAL | Status: DC | PRN

## 2015-09-07 MED ORDER — SUMATRIPTAN SUCCINATE 50 MG PO TABS
50.0000 mg | ORAL_TABLET | ORAL | Status: DC | PRN
  Administered 2015-09-07 – 2015-09-08 (×4): 50 mg via ORAL
  Filled 2015-09-07 (×4): qty 1

## 2015-09-07 MED ORDER — ALUM & MAG HYDROXIDE-SIMETH 200-200-20 MG/5ML PO SUSP
30.0000 mL | ORAL | Status: DC | PRN
Start: 2015-09-07 — End: 2015-09-09

## 2015-09-07 MED ORDER — LORAZEPAM 1 MG PO TABS: 0.0000 mg | ORAL_TABLET | Freq: Two times a day (BID) | ORAL | Status: DC

## 2015-09-07 MED ORDER — ALUM & MAG HYDROXIDE-SIMETH 200-200-20 MG/5ML PO SUSP: 30.0000 mL | ORAL | Status: DC | PRN

## 2015-09-07 MED ORDER — HYDROCHLOROTHIAZIDE 12.5 MG PO CAPS
12.5000 mg | ORAL_CAPSULE | Freq: Every day | ORAL | Status: DC
  Administered 2015-09-07 – 2015-09-09 (×3): 12.5 mg via ORAL
  Filled 2015-09-07 (×6): qty 1

## 2015-09-07 MED ORDER — GUAIFENESIN ER 600 MG PO TB12
600.0000 mg | ORAL_TABLET | Freq: Two times a day (BID) | ORAL | Status: DC
  Administered 2015-09-07 – 2015-09-09 (×4): 600 mg via ORAL
  Filled 2015-09-07 (×9): qty 1

## 2015-09-07 MED ORDER — INFLUENZA VAC SPLIT QUAD 0.5 ML IM SUSY
0.5000 mL | PREFILLED_SYRINGE | INTRAMUSCULAR | Status: AC
  Administered 2015-09-08: 0.5 mL via INTRAMUSCULAR
  Filled 2015-09-07: qty 0.5

## 2015-09-07 MED ORDER — IBUPROFEN 200 MG PO TABS: 600.0000 mg | ORAL_TABLET | Freq: Three times a day (TID) | ORAL | Status: DC | PRN

## 2015-09-07 MED ORDER — LISINOPRIL-HYDROCHLOROTHIAZIDE 10-12.5 MG PO TABS: 1.0000 | ORAL_TABLET | Freq: Every day | ORAL | Status: DC

## 2015-09-07 MED ORDER — LORAZEPAM 1 MG PO TABS: 0.0000 mg | ORAL_TABLET | Freq: Four times a day (QID) | ORAL | Status: DC

## 2015-09-07 NOTE — Progress Notes (Signed)
D: Pt presents with flat affect and depressed mood. Pt reported feeling increasingly depressed and appears sad. Pt endorse suicidal thoughts today. No plan verbalized by pt at this time. Pt verbally contracts not to harm self. Pt c/o migraine this morning and received Imitrex as ordered per MD for migraines. Per pt, med was effective. Pt b/p elevated this morning. Cobos, MD., made aware and b/p rechecked. B/p decreased to 149/84,  after pt received b/p meds.  A: Medications administered as ordered per MD. Verbal support given. Pt encouraged to attend groups. 15 minute checks performed for safety.  R: Pt receptive to tx.

## 2015-09-07 NOTE — [facility] Suicide Risk Assessment (Signed)
[facility]   Nursing information obtained from:  Patient Demographic factors:  Male Current Mental Status:  Self-harm thoughts Loss Factors:  NA (voices depression) Historical Factors:  Prior suicide attempts Risk Reduction Factors:  NA Total Time spent with patient: 45 minutes Principal Problem: MDD (major depressive disorder), recurrent severe, without psychosis (HCC) Diagnosis:   Patient Active Problem List   Diagnosis Date Noted  . MDD (major depressive disorder), recurrent severe, without psychosis (HCC) [F33.2] 09/07/2015  . Major depressive disorder, recurrent, severe without psychotic features (HCC) [F33.2]   . Cocaine dependence (HCC) [F14.20] 08/31/2014  . Alcohol abuse [F10.10] 08/31/2014     Continued Clinical Symptoms:  Alcohol Use Disorder Identification Test Final Score (AUDIT): 21 The "Alcohol Use Disorders Identification Test", Guidelines for Use in Primary Care, Second Edition.  World Science writerHealth Organization [facility](WHO). Score between 0-7:  no or low risk or alcohol related problems. Score between 8-15:  moderate risk of alcohol related problems. Score between 16-19:  high risk of alcohol related problems. Score 20 or above:  warrants further diagnostic evaluation for alcohol dependence and treatment.   CLINICAL FACTORS:   Patient is a 54 year old man, who is a [employer]an. Reports a history of Cocaine Dependence and of Depression. He states that after a period of several  Months sobriety he relapsed earlier this year ( on crack cocaine ). Since then he has been having difficulty maintaining sobriety.  He has a history of PTSD. Presented to the ED with depression, sadness,  suicidal ideations , with a thought of crashing his car. Of note, he states he has been on Wellbutrin in the past, with good response and no side effects.  At this time patient presents sad, depressed, but denies any SI at present .  Dx- MDD, PTSD by history, Cocaine  Dependence Plan- inpatient admission.  We discussed different options -  Prefers to continue Wellbutrin , which he states has helped and been well tolerated . He states he plans to go to an [facility] after discharge   Musculoskeletal: Strength & Muscle Tone: within normal limits Gait & Station: normal Patient leans: N/A  Psychiatric Specialty Exam: Physical Exam  ROS  Blood pressure 149/84, pulse 64, temperature 99.1 F (37.3 C), temperature source Oral, resp. rate 16, height 5\' 8"  (1.727 m), weight 238 lb (107.956 kg).Body mass index is 36.2 kg/(m^2).  General Appearance: Fairly Groomed  Patent attorneyye Contact::  Fair  Speech:  Normal Rate  Volume:  Normal  Mood:  Depressed  Affect:  Depressed  Thought Process:  Goal Directed and Linear  Orientation:  Full (Time, Place, and Person)  Thought Content:  denies hallucinations, no delusions , not internally preoccupied   Suicidal Thoughts:  No- at this time is able to contract for safety on unit , denies any current plan or intention of suicide .   Homicidal Thoughts:  No  Memory:  recent and remote grossly intact   Judgement:  Fair  Insight:  Present  Psychomotor Activity:  Normal  Concentration:  Good  Recall:  Good  Fund of Knowledge:Good  Language: Good  Akathisia:  Negative  Handed:  Right  AIMS (if indicated):     Assets:  Communication Skills Desire for Improvement Resilience  Sleep:     Cognition: WNL  ADL's:  Intact     COGNITIVE FEATURES THAT CONTRIBUTE TO RISK:  Loss of executive function    SUICIDE RISK:   Moderate:  Frequent suicidal ideation with limited intensity,  and duration, some specificity in terms of plans, no associated intent, good self-control, limited dysphoria/symptomatology, some risk factors present, and identifiable protective factors, including available and accessible social support.  PLAN OF CARE: Patient will be admitted to inpatient psychiatric unit for stabilization and safety. Will provide  and encourage milieu participation. Provide medication management and maked adjustments as needed.  Will follow daily.    Medical Decision Making:  Review of Psycho-Social Stressors (1), Review or order clinical lab tests (1), Established Problem, Worsening (2) and Review of Medication Regimen & Side Effects (2)  I certify that inpatient services furnished can reasonably be expected to improve the patient's condition.   Nayomi Tabron 09/07/2015, 4:32 PM

## 2015-09-07 NOTE — [facility] Group Notes (Signed)
[facility] LCSW Group Therapy 09/07/2015 1:15 PM  Type of Therapy: Group Therapy- Feelings about Diagnosis  Pt did not attend, declined invitation.   Chad CordialLauren Carter, LCSWA 09/07/2015 2:23 PM

## 2015-09-07 NOTE — Tx Team (Signed)
Initial Interdisciplinary Treatment Plan   PATIENT STRESSORS: Medication change or noncompliance Substance abuse   PATIENT STRENGTHS: Ability for insight Average or above average intelligence Capable of independent living MetallurgistCommunication skills Financial means General fund of knowledge Motivation for treatment/growth   PROBLEM LIST: Problem List/Patient Goals Date to be addressed Date deferred Reason deferred Estimated date of resolution  Depression      Risk for self harm      Substance abuse (alcohol and crack)      "I need to learn effective coping skills"      "I have run out of things.  I have been doing this for 30 years" )Patient tearful                               DISCHARGE CRITERIA:  Adequate post-discharge living arrangements Improved stabilization in mood, thinking, and/or behavior Verbal commitment to aftercare and medication compliance  PRELIMINARY DISCHARGE PLAN: Attend 12-step recovery group Outpatient therapy Return to previous living arrangement  PATIENT/FAMIILY INVOLVEMENT: This treatment plan has been presented to and reviewed with the patient, Molinda BailiffJohn Havel, and/or family member, .  The patient and family have been given the opportunity to ask questions and make suggestions.  Ellis ParentsJoyce S Ainsley Deakins 09/07/2015, 6:48 AM
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
## 2015-09-07 NOTE — ED Notes (Signed)
[facility] and ready to be reviewed

## 2015-09-07 NOTE — Tx Team (Signed)
Interdisciplinary Treatment Plan Update (Adult) Date: 09/07/2015   Date: 09/07/2015 12:01 PM  Progress in Treatment:  Attending groups: Pt is new to milieu, continuing to assess  Participating in groups: Pt is new to milieu, continuing to assess  Taking medication as prescribed: Yes  Tolerating medication: Yes  Family/Significant othe contact made: No, CSW assessing for appropriate  Patient understands diagnosis: Yes Discussing patient identified problems/goals with staff: Yes  Medical problems stabilized or resolved: Yes  Denies suicidal/homicidal ideation: No, Pt endorses passive SI Patient has not harmed self or Others: Yes   New problem(s) identified: None identified at this time.   Discharge Plan or Barriers: Pt will return to his [facility] and follow-up with the [place].  Additional comments: n/a   Reason for Continuation of Hospitalization:  Anxiety Depression Medication stabilization Suicidal ideation  Estimated length of stay: 3-5 days  Review of initial/current patient goals per problem list:   1.  Goal(s): Patient will participate in aftercare plan  Met:  Yes  Target date: 3-5 days from date of admission   As evidenced by: Patient will participate within aftercare plan AEB aftercare provider and housing plan at discharge being identified.   09/07/15: Pt will return to [facility] and follow-up with [facility]  2.  Goal (s): Patient will exhibit decreased depressive symptoms and suicidal ideations.  Met:  No  Target date: 3-5 days from date of admission   As evidenced by: Patient will utilize self rating of depression at 3 or below and demonstrate decreased signs of depression or be deemed stable for discharge by MD. 09/07/15: Pt was admitted with symptoms of depression, rating 10/10. Pt continues to present with flat affect and depressive symptoms.  Pt will demonstrate decreased symptoms of depression and rate depression at 3/10 or lower prior to  discharge.  3.  Goal(s): Patient will demonstrate decreased signs and symptoms of anxiety.  Met:  No  Target date: 3-5 days from date of admission   As evidenced by: Patient will utilize self rating of anxiety at 3 or below and demonstrated decreased signs of anxiety, or be deemed stable for discharge by MD 09/07/15: Pt was admitted with increased levels of anxiety and is currently rating those symptoms highly. Pt will demonstrated decreased symptoms of anxiety and rate it at 3/10 prior to d/c.  Attendees:  Patient:    Family:    Physician: Dr. Parke Poisson, MD  09/07/2015 12:01 PM  Nursing: Lars Pinks, RN Case manager  09/07/2015 12:01 PM  Clinical Social Worker Norman Clay, MSW 09/07/2015 12:01 PM  Other: Lucinda Dell, Beverly Sessions Liasion 09/07/2015 12:01 PM  Clinical: Darrol Angel, RN 09/07/2015 12:01 PM  Other: , RN Charge Nurse 09/07/2015 12:01 PM  Other:     Peri Maris, Latanya Presser MSW
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
## 2015-09-07 NOTE — [facility] Group Notes (Signed)
[facility] Group Notes:  (Nursing/MHT/Case Management/Adjunct)  Date:  09/07/2015  Time:  0900  Type of Therapy:  Nurse Education - SMART Method of Goal Setting to Promote Recovery  Participation Level:  Did Not Attend  Participation Quality:    Affect:    Cognitive:    Insight:    Engagement in Group:    Modes of Intervention:    Summary of Progress/Problems: Patient invited however did not attend.  Merian CapronFriedman, Xavior [facility] 09/07/2015, 9:41 AM

## 2015-09-07 NOTE — Progress Notes (Signed)
Recreation Therapy Notes  Animal-Assisted Activity (AAA) Program Checklist/Progress Notes Patient Eligibility Criteria Checklist & Daily Group note for Rec Tx Intervention  Date: 11.08.2016 Time: 2:45pm [facility] [address] [facility]    AAA/T Program Assumption of Risk Form signed by Patient/ or Parent Legal Guardian yes  Patient is free of allergies or sever asthma yes  Patient reports no fear of animals yes  Patient reports no history of cruelty to animals yes  Patient understands his/her participation is voluntary yes  Behavioral Response: Did not attend.    Marykay Lexenise L Sherryl Valido, LRT/CTRS        Jearl KlinefelterBlanchfield, Laressa Bolinger L 09/07/2015 3:16 PM

## 2015-09-07 NOTE — ED Provider Notes (Signed)
CSN: [id number]     Arrival date & time 09/07/15  0036 History  By signing my name below, I, Lyndel SafeKaitlyn Shelton, attest that this documentation has been prepared under the direction and in the presence of Gilda Creasehristopher J Pollina, MD. Electronically Signed: Lyndel SafeKaitlyn Shelton, ED Scribe. 09/07/2015. 12:56 AM.   Chief Complaint  Patient presents with  . Medical Clearance   The history is provided by the patient. No language interpreter was used.   HPI Comments: Alexander Munoz is a 54 y.o. male, with a Pmhx of PTSD, depression and anxiety, who [facility]. He notes a PMhx of depression that has been worsening for 1 month now. [facility]. He states he has recently been off his medications and has not been attending group therapy. Reports SI X 3 days with a plan to crash a car. He has a history of an intentional drug overdose. Pt also has a distant h/o hospitalization for his depression and reports he feels he would benefit from hospitalization for his current depressive state.   Past Medical History  Diagnosis Date  . Migraines   . Hypertension   . Depression   . Anxiety   . GERD (gastroesophageal reflux disease)   . Lactose intolerance    History reviewed. No pertinent past surgical history. Family History  Problem Relation Age of Onset  . Cancer Other   . Hypertension Other   . Heart attack Other    Social History  Substance Use Topics  . Smoking status: Current Every Day Smoker -- 1.00 packs/day    Types: Cigarettes  . Smokeless tobacco: None  . Alcohol Use: Yes     Comment: Alcohol 3x's a week     Review of Systems  Constitutional: Negative for fever.  Psychiatric/Behavioral: Positive for suicidal ideas and dysphoric mood. Negative for self-injury.  All other systems reviewed and are negative.  Allergies  Lactose intolerance  (gi)  Home Medications   Prior to Admission medications   Medication Sig Start Date End Date Taking? Authorizing Provider  buPROPion (WELLBUTRIN) 75 MG tablet Take 1 tablet (75 mg total) by mouth daily. Patient taking differently: Take 150 mg by mouth daily.  09/01/14   Beau FannyJohn C Withrow, FNP  hydrOXYzine (ATARAX/VISTARIL) 25 MG tablet Take 25 mg by mouth 3 (three) times daily as needed for anxiety.    Historical Provider, MD  lisinopril-hydrochlorothiazide (PRINZIDE,ZESTORETIC) 10-12.5 MG per tablet Take 1 tablet by mouth daily. 09/01/14   Beau FannyJohn C Withrow, FNP  omeprazole (PRILOSEC) 20 MG capsule Take 1 capsule (20 mg total) by mouth daily. 09/01/14   Beau FannyJohn C Withrow, FNP  sildenafil (VIAGRA) 100 MG tablet Take 100 mg by mouth daily as needed for erectile dysfunction.    Historical Provider, MD  SUMAtriptan (IMITREX) 50 MG tablet Take 50 mg by mouth every 2 (two) hours as needed. For migraine    Historical Provider, MD  traZODone (DESYREL) 100 MG tablet Take 50 mg by mouth at bedtime as needed for sleep.    Historical Provider, MD  verapamil (COVERA HS) 240 MG (CO) 24 hr tablet Take 1 tablet (240 mg total) by mouth at bedtime. 09/01/14   Beau FannyJohn C Withrow, FNP   BP 163/116 mmHg  Pulse 89  Temp(Src) 98.3 F (36.8 C) (Oral)  Resp 22  Ht 5\' 8"  (1.727 m)  Wt 236 lb (107.049 kg)  BMI 35.89  kg/m2  SpO2 100% Physical Exam  Constitutional: He is oriented to person, place, and time. He appears well-developed and well-nourished. No distress.  HENT:  Head: Normocephalic and atraumatic.  Right Ear: Hearing normal.  Left Ear: Hearing normal.  Nose: Nose normal.  Mouth/Throat: Oropharynx is clear and moist and mucous membranes are normal.  Eyes: Conjunctivae and EOM are normal. Pupils are equal, round, and reactive to light.  Neck: Normal range of motion. Neck supple.  Cardiovascular: Normal rate, regular rhythm, S1 normal, S2 normal and normal heart sounds.  Exam reveals no gallop and no friction rub.    No murmur heard. Pulmonary/Chest: Effort normal and breath sounds normal. No respiratory distress. He exhibits no tenderness.  Abdominal: Soft. Normal appearance and bowel sounds are normal. There is no hepatosplenomegaly. There is no tenderness. There is no rebound, no guarding, no tenderness at McBurney's point and negative Murphy's sign. No hernia.  Musculoskeletal: Normal range of motion.  Neurological: He is alert and oriented to person, place, and time. He has normal strength. No cranial nerve deficit or sensory deficit. Coordination normal. GCS eye subscore is 4. GCS verbal subscore is 5. GCS motor subscore is 6.  Skin: Skin is warm, dry and intact. No rash noted. No cyanosis.  Psychiatric: His speech is normal.  Depressed mood, SI.   Nursing note and vitals reviewed.   ED Course  Procedures  DIAGNOSTIC STUDIES: Oxygen Saturation is 100% on RA, normal by my interpretation.    COORDINATION OF CARE: 12:54 AM Discussed treatment plan with pt at bedside and pt agreed to plan.  Labs Review Labs Reviewed  COMPREHENSIVE METABOLIC PANEL  ETHANOL  SALICYLATE LEVEL  ACETAMINOPHEN LEVEL  CBC  URINE RAPID DRUG SCREEN, HOSP PERFORMED   I have personally reviewed and evaluated these lab results as part of my medical decision-making.   MDM   Final diagnoses:  None  Depression  Presents to the ER for evaluation of worsening depression. Patient presented to behavioral health earlier today and was referred to the ER for medical clearance. Patient admits to drinking alcohol regularly and has occasionally used crack cocaine. This has caused significant depression. He has been off his medications and has not been going to his group meetings. Patient now feeling suicidal, thinking about crashing his car. Will require psychiatric evaluation.  I personally performed the services described in this documentation, which was scribed in my presence. The recorded information has been reviewed and  is accurate.     Gilda Crease, MD 09/07/15 [id number]

## 2015-09-07 NOTE — ED Notes (Signed)
[facility] and was sent here for medical clearance  Pt states he drinks a 6 pack or two of beer per week and has been using crack  Pt states he takes wellbutrin for depression but has not been taking his medication as prescribed for a few months and has not been taking his medication at all for the past month
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
## 2015-09-07 NOTE — Progress Notes (Signed)
Patient ID: Alexander BailiffJohn Munoz, male   DOB: [birth year], 54 y.o.   MRN: [id number] PER STATE REGULATIONS 482.30  THIS CHART WAS REVIEWED FOR MEDICAL NECESSITY WITH RESPECT TO THE PATIENT'S ADMISSION/DURATION OF STAY.  NEXT REVIEW DATE:09/11/15  Loura HaltBARBARA Lysle Yero, RN, BSN CASE MANAGER

## 2015-09-08 DIAGNOSIS — F332 Major depressive disorder, recurrent severe without psychotic features: Secondary | ICD-10-CM | POA: Diagnosis not present

## 2015-09-08 NOTE — Progress Notes (Signed)
Pt did not attend the evening wrap up group. Pt was in his bed sleeping. 

## 2015-09-08 NOTE — Progress Notes (Deleted)
   D: Pt was pleasant, but flat and depressed during the assessment. Informed the pt that I would be performing an EKG per Dr's order. Pt informed the writer that he is passive for SI, but contracts for safety.  A:  Support and encouragement was offered. 15 min checks continued for safety.  R: Pt remains safe.

## 2015-09-08 NOTE — Progress Notes (Signed)
[facility] MD Progress Note  09/08/2015 5:06 PM Alin Chavira  MRN:  [id number] Subjective:  Patient reports feeling better. At this time  Reporting improved mood, and states " I feel better , back to normal". He is not currently presenting with any significant  Withdrawal symptoms. Denies cravings for cocaine or alcohol at this time- future oriented, interested in returning to [facility] after discharge. Describes history of Migraine Headaches, for which he uses Imitrex without side effects Objective : I have discussed case with treatment team and have met with patient. Patient presents significantly improved compared to admission. Presents with fuller range of affect , smiling at times appropriately, minimizing any severe, ongoing symptoms of depression Denies medication side effects- on Wellbutrin , which he states he has been on for a period of time without side effects and with good response. At this time no disruptive or agitated behaviors on unit, becoming more visible on unit- group participation has been limited thus far Principal Problem: MDD (major depressive disorder), recurrent severe, without psychosis ([place]) Diagnosis:   Patient Active Problem List   Diagnosis Date Noted  . MDD (major depressive disorder), recurrent severe, without psychosis ([place]) [F33.2] 09/07/2015  . Major depressive disorder, recurrent, severe without psychotic features ([place]) [F33.2]   . Cocaine dependence ([place]) [F14.20] 08/31/2014  . Alcohol abuse [F10.10] 08/31/2014   Total Time spent with patient: 20 minutes    Past Medical History:  Past Medical History  Diagnosis Date  . Migraines   . Hypertension   . Depression   . Anxiety   . GERD (gastroesophageal reflux disease)   . Lactose intolerance    History reviewed. No pertinent past surgical history. Family History:  Family History  Problem Relation Age of Onset  . Cancer Other   . Hypertension Other   . Heart attack Other     Social History:   History  Alcohol Use  . Yes    Comment: Alcohol 3x's a week      History  Drug Use  . Yes  . Special: Cocaine    Comment: Daily cocaine use     Social History   Social History  . Marital Status: Legally Separated    Spouse Name: N/A  . Number of Children: N/A  . Years of Education: N/A   Social History Main Topics  . Smoking status: Current Every Day Smoker -- 1.00 packs/day for 5 years    Types: Cigarettes  . Smokeless tobacco: None  . Alcohol Use: Yes     Comment: Alcohol 3x's a week   . Drug Use: Yes    Special: Cocaine     Comment: Daily cocaine use   . Sexual Activity: No   Other Topics Concern  . None   Social History Narrative   Additional Social History:    Pain Medications: see home med list Prescriptions: see home med list Over the Counter: see home med list History of alcohol / drug use?: Yes Name of Substance 1: alcohol (alcohol) 1 - Amount (size/oz): 2 six packs weekly 1 - Duration: ongoing Name of Substance 2: cocaine 2 - Amount (size/oz): lately $200-$300  2 - Frequency: every 2 days 2 - Duration: ongoing 2 - Last Use / Amount: 09/06/15 at 2000  Sleep: improved  Appetite:  Good  Current Medications: Current Facility-Administered Medications  Medication Dose Route Frequency Provider Last Rate Last Dose  . acetaminophen (TYLENOL) tablet 650 mg  650 mg Oral Q6H PRN Laverle Hobby, PA-C  650 mg at 09/08/15 0826  . alum & mag hydroxide-simeth (MAALOX/MYLANTA) 200-200-20 MG/5ML suspension 30 mL  30 mL Oral Q4H PRN Laverle Hobby, PA-C      . buPROPion [facility]) tablet 150 mg  150 mg Oral Daily Laverle Hobby, PA-C   150 mg at 09/08/15 0827  . guaiFENesin (MUCINEX) 12 hr tablet 600 mg  600 mg Oral BID Derrill Center, NP   600 mg at 09/08/15 1626  . lisinopril (PRINIVIL,ZESTRIL) tablet 10 mg  10 mg Oral Daily Jenne Campus, MD   10 mg at 09/08/15 2505   And  . hydrochlorothiazide (MICROZIDE) capsule 12.5 mg  12.5 mg Oral Daily Myer Peer Elon Lomeli, MD   12.5 mg at 09/08/15 0827  . hydrOXYzine (ATARAX/VISTARIL) tablet 25 mg  25 mg Oral TID PRN Laverle Hobby, PA-C   25 mg at 09/07/15 2141  . magnesium hydroxide (MILK OF MAGNESIA) suspension 30 mL  30 mL Oral Daily PRN Laverle Hobby, PA-C      . pantoprazole (PROTONIX) EC tablet 40 mg  40 mg Oral Daily Laverle Hobby, PA-C   40 mg at 09/08/15 0826  . SUMAtriptan (IMITREX) tablet 50 mg  50 mg Oral Q2H PRN Laverle Hobby, PA-C   50 mg at 09/08/15 1628  . traZODone (DESYREL) tablet 50 mg  50 mg Oral QHS PRN Laverle Hobby, PA-C   50 mg at 09/07/15 2141  . verapamil (CALAN-SR) CR tablet 240 mg  240 mg Oral QHS Laverle Hobby, PA-C   240 mg at 09/07/15 2143    Lab Results:  Results for orders placed or performed during the hospital encounter of 09/07/15 (from the past 48 hour(s))  Comprehensive metabolic panel     Status: Abnormal   Collection Time: 09/07/15  1:12 AM  Result Value Ref Range   Sodium 135 135 - 145 mmol/L   Potassium 3.7 3.5 - 5.1 mmol/L   Chloride 104 101 - 111 mmol/L   CO2 24 22 - 32 mmol/L   Glucose, Bld 106 (H) 65 - 99 mg/dL   BUN 10 6 - 20 mg/dL   Creatinine, Ser 1.11 0.61 - 1.24 mg/dL   Calcium 9.2 8.9 - 10.3 mg/dL   Total Protein 7.6 6.5 - 8.1 g/dL   Albumin 4.3 3.5 - 5.0 g/dL   AST 23 15 - 41 U/L   ALT 21 17 - 63 U/L   Alkaline Phosphatase 68 38 - 126 U/L   Total Bilirubin 0.8 0.3 - 1.2 mg/dL   GFR calc non Af Amer >60 >60 mL/min   GFR calc Af Amer >60 >60 mL/min    Comment: (NOTE) The eGFR has been calculated using the CKD EPI equation. This calculation has not been validated in all clinical situations. eGFR's persistently <60 mL/min signify possible Chronic Kidney Disease.    Anion gap 7 5 - 15  CBC     Status: Abnormal   Collection Time: 09/07/15  1:12 AM  Result Value Ref Range   WBC 11.0 (H) 4.0 - 10.5 K/uL   RBC 4.75 4.22 - 5.81 MIL/uL   Hemoglobin 12.9 (L) 13.0 - 17.0 g/dL   HCT 37.2 (L) 39.0 - 52.0 %   MCV 78.3 78.0 - 100.0 fL    MCH 27.2 26.0 - 34.0 pg   MCHC 34.7 30.0 - 36.0 g/dL   RDW 14.7 11.5 - 15.5 %   Platelets 430 (H) 150 - 400 K/uL  Ethanol (ETOH)  Status: None   Collection Time: 09/07/15  1:14 AM  Result Value Ref Range   Alcohol, Ethyl (B) <5 <5 mg/dL    Comment:        LOWEST DETECTABLE LIMIT FOR SERUM ALCOHOL IS 5 mg/dL FOR MEDICAL PURPOSES ONLY   Salicylate level     Status: None   Collection Time: 09/07/15  1:14 AM  Result Value Ref Range   Salicylate Lvl <7.0 2.8 - 30.0 mg/dL  Acetaminophen level     Status: Abnormal   Collection Time: 09/07/15  1:14 AM  Result Value Ref Range   Acetaminophen (Tylenol), Serum <10 (L) 10 - 30 ug/mL    Comment:        THERAPEUTIC CONCENTRATIONS VARY SIGNIFICANTLY. A RANGE OF 10-30 ug/mL MAY BE AN EFFECTIVE CONCENTRATION FOR MANY PATIENTS. HOWEVER, SOME ARE BEST TREATED AT CONCENTRATIONS OUTSIDE THIS RANGE. ACETAMINOPHEN CONCENTRATIONS >150 ug/mL AT 4 HOURS AFTER INGESTION AND >50 ug/mL AT 12 HOURS AFTER INGESTION ARE OFTEN ASSOCIATED WITH TOXIC REACTIONS.   Urine rapid drug screen (hosp performed) (Not at [facility])     Status: Abnormal   Collection Time: 09/07/15  2:27 AM  Result Value Ref Range   Opiates NONE DETECTED NONE DETECTED   Cocaine POSITIVE (A) NONE DETECTED   Benzodiazepines NONE DETECTED NONE DETECTED   Amphetamines NONE DETECTED NONE DETECTED   Tetrahydrocannabinol NONE DETECTED NONE DETECTED   Barbiturates NONE DETECTED NONE DETECTED    Comment:        DRUG SCREEN FOR MEDICAL PURPOSES ONLY.  IF CONFIRMATION IS NEEDED FOR ANY PURPOSE, NOTIFY LAB WITHIN 5 DAYS.        LOWEST DETECTABLE LIMITS FOR URINE DRUG SCREEN Drug Class       Cutoff (ng/mL) Amphetamine      1000 Barbiturate      200 Benzodiazepine   177 Tricyclics       939 Opiates          300 Cocaine          300 THC              50     Physical Findings: AIMS: Facial and Oral Movements Muscles of Facial Expression: None, normal Lips and Perioral Area:  None, normal Jaw: None, normal Tongue: None, normal,Extremity Movements Upper (arms, wrists, hands, fingers): None, normal Lower (legs, knees, ankles, toes): None, normal, Trunk Movements Neck, shoulders, hips: None, normal, Overall Severity Severity of abnormal movements (highest score from questions above): None, normal Incapacitation due to abnormal movements: None, normal Patient's awareness of abnormal movements (rate only patient's report): No Awareness, Dental Status Current problems with teeth and/or dentures?: Yes (needs implants) Does patient usually wear dentures?: No  CIWA:  CIWA-Ar Total: 10 COWS:     Musculoskeletal: Strength & Muscle Tone: within normal limits - no tremors, no diaphoresis Gait & Station: normal Patient leans: N/A  Psychiatric Specialty Exam: ROS- reports migraine headaches, denies chest pain, denies SOB, denies vomiting, nausea, no rash  Blood pressure 128/85, pulse 66, temperature 98.1 F (36.7 C), temperature source Oral, resp. rate 18, height '5\' 8"'  (1.727 m), weight 238 lb (107.956 kg).Body mass index is 36.2 kg/(m^2).  General Appearance: Well Groomed  Engineer, water::  Good  Speech:  Normal Rate  Volume:  Normal  Mood:  improved and today minimizes depression  Affect:  more reactive, smiles at times appropriately  Thought Process:  Goal Directed and Linear  Orientation:  Full (Time, Place, and Person)  Thought Content:  denies  hallucinations, no delusions   Suicidal Thoughts:  No- denies any self injurious ideations or any suicidal ideations  Homicidal Thoughts:  No  Memory:  recent and remote grossly intact   Judgement:  Other:  improved   Insight:  improved   Psychomotor Activity:  Normal  Concentration:  Good  Recall:  Good  Fund of Knowledge:Good  Language: Good  Akathisia:  Negative  Handed:  Right  AIMS (if indicated):     Assets:  Communication Skills Desire for Improvement Resilience  ADL's:  Intact  Cognition: WNL  Sleep:   Number of Hours: 7  Assessment - at this time patient improved compared to admission- currently presenting with improving mood, improved range of affect, no SI. Behavior on unit in good control. More focused on being discharged soon, wanting to go to an [facility] after discharge.  Treatment Plan Summary: Daily contact with patient to assess and evaluate symptoms and progress in treatment, Medication management, Plan inpatient treatment  and medication management as below Encourage increased milieu, group participation to work on coping skills, symptom reduction Treatment team working on disposition options Continue Trazodone 50 mgrs QHS for insomnia as needed Continue Wellbutrin 150 mgrs QDAY for management of depression Continue Vistaril 25 mgrs Q 8 hours PRN for anxiety as needed  Continue Calan, HCTZ, Lisinopril  for management of HTN Continue to encourage efforts and focus on abstinence, relapse prevention efforts .   Lutricia Widjaja 09/08/2015, 5:06 PM

## 2015-09-08 NOTE — Plan of Care (Signed)
Problem: Diagnosis: Increased Risk For Suicide Attempt Goal: STG-Patient Will Report Suicidal Feelings to Staff Outcome: Progressing Patient contracts for safety today on the unit.

## 2015-09-08 NOTE — Progress Notes (Signed)
   D: Pt was pleasant, but flat and depressed during the assessment. Pt appeared to be apprehensive. Pt was in bed during most of the shift. Encouraged pt to attend groups.  Pt had Pt has no questions or concerns.   A:  Support and encouragement was offered. 15 min checks continued for safety.  R: Pt remains safe.

## 2015-09-08 NOTE — Progress Notes (Signed)
Recreation Therapy Notes  Date: 09/08/15 Time: 930 Location: 300 Hall Dayroom  Group Topic: Stress Management  Goal Area(s) Addresses:  Patient will verbalize importance of using healthy stress management.  Patient will identify positive emotions associated with healthy stress management.   Intervention: Stress Management  Activity :  Progressive Muscle Relaxation.  LRT introduced and educated patients on stress management technique of progressive muscle relaxation.  A script was used to deliver the technique to patients and patients were asked to follow script read allowed by LRT to engage in practicing the stress management technique.    Education:  Stress Management, Discharge Planning.   Education Outcome: Acknowledges edcuation/In group clarification offered/Needs additional education  Clinical Observations/Feedback:  Patient did not attend group.    Hassen Bruun, LRT/CTRS         Yves Fodor A 09/08/2015 4:04 PM 

## 2015-09-08 NOTE — BHH Group Notes (Signed)
BHH LCSW Group Therapy 09/08/2015 1:15 PM  Type of Therapy: Group Therapy- Emotion Regulation  Pt did not attend, declined invitation.   Marciel Offenberger Carter, LCSWA 09/08/2015 2:49 PM  

## 2015-09-08 NOTE — Progress Notes (Signed)
D:  Patient is alert and oriented on the unit this shift.  Patient had to be awakened for medication administration in the am.  Patient reported his sleep was fair with sleep medication, appetite was good and energy level was low.  Patient reported his concentration was poor and he rated his depression a "7" on a scale of 0-10.  Patient rated his anxiety a "6" on a scale of 0-10.  Patient's self-inventory indicates that he did have thoughts of self-harm today, however, upon questioning patient as to whether or not he was having thoughts of self-harm, patient states, "not now."  Patient denies suicidal ideation, homicidal ideation, auditory or visual hallucinations to this Clinical research associatewriter. Patient did not attend groups this shift. A:  Scheduled medications were administered to patient per MD orders.  Emotional support and encouragement were provided.  Patient is maintained on q.15 minute safety checks.  Patient was encouraged to push fluids and was given Gatorade at the bedside.  Patient was informed to notify staff with any questions or concerns. R:  No adverse medication reactions were noted.  Patient was cooperative with medication administration this shift.  Patient was receptive, calm and cooperative at this time.  Patient was isolative, staying in bed this am, however, patient did go outside this afternoon.  Patient contracts for safety on the unit at this time.  Patient remains safe at this time.

## 2015-09-08 NOTE — [facility] Suicide Risk Assessment (Signed)
[facility] INPATIENT:  Family/Significant Other Suicide Prevention Education  Suicide Prevention Education:  Patient Refusal for Family/Significant Other Suicide Prevention Education: The patient Alexander Munoz has refused to provide written consent for family/significant other to be provided Family/Significant Other Suicide Prevention Education during admission and/or prior to discharge.  Physician notified. SPE reviewed with patient and brochure provided. Patient encouraged to return to hospital if having suicidal thoughts, patient verbalized his/her understanding and has no further questions at this time.   Elaina Hoopsarter, Arantza Darrington M 09/08/2015, 3:47 PM

## 2015-09-08 NOTE — [facility] Group Notes (Signed)
[facility]  09/08/2015 8:45 AM  Pt did not attend, declined invitation.   Chad CordialLauren Carter, LCSWA 09/08/2015 9:20 AM

## 2015-09-08 NOTE — Plan of Care (Signed)
Problem: Diagnosis: Increased Risk For Suicide Attempt Goal: STG-Patient Will Comply With Medication Regime Outcome: Progressing Patient was cooperative with medication administration this shift.

## 2015-09-09 MED ORDER — PANTOPRAZOLE SODIUM 40 MG PO TBEC: 40.0000 mg | DELAYED_RELEASE_TABLET | Freq: Every day | ORAL | Status: DC

## 2015-09-09 MED ORDER — BUPROPION HCL 75 MG PO TABS: 150.0000 mg | ORAL_TABLET | Freq: Every day | ORAL | Status: DC

## 2015-09-09 MED ORDER — HYDROXYZINE HCL 25 MG PO TABS: 25.0000 mg | ORAL_TABLET | Freq: Three times a day (TID) | ORAL | Status: DC | PRN

## 2015-09-09 MED ORDER — VERAPAMIL HCL ER 240 MG PO TBCR: 240.0000 mg | EXTENDED_RELEASE_TABLET | Freq: Every day | ORAL | Status: DC

## 2015-09-09 MED ORDER — TRAZODONE HCL 50 MG PO TABS: 50.0000 mg | ORAL_TABLET | Freq: Every evening | ORAL | Status: DC | PRN

## 2015-09-09 NOTE — BHH Suicide Risk Assessment (Signed)
[facility]   Demographic Factors:  54 year old  Separated male , Alexander RippleVeteran, [facility]   Total Time spent with patient: 30 minutes  Musculoskeletal: Strength & Muscle Tone: within normal limits Gait & Station: normal Patient leans: N/A  Psychiatric Specialty Exam: Physical Exam no tremors, no diaphoresis, no restlessness or psychomotor agitation  ROS  Blood pressure 131/82, pulse 70, temperature 98.2 F (36.8 C), temperature source Oral, resp. rate 18, height 5\' 8"  (1.727 m), weight 238 lb (107.956 kg).Body mass index is 36.2 kg/(m^2).  General Appearance: Well Groomed  Patent attorneyye Contact::  Good  Speech:  Normal Rate409  Volume:  Normal  Mood:  Euthymic- denies any depression  Affect:  appropriate, full range   Thought Process:  Goal Directed and Linear  Orientation:  Full (Time, Place, and Person)  Thought Content:  denies hallucinations, no delusions   Suicidal Thoughts:  No- no suicidal or self injurious ideations  Homicidal Thoughts:  No  Memory:  recent and remote grossly intact   Judgement:  Other:  improved   Insight:  Present  Psychomotor Activity:  Normal  Concentration:  Good  Recall:  Good  Fund of Knowledge:Good  Language: Good  Akathisia:  Negative  Handed:  Right  AIMS (if indicated):     Assets:  Desire for Improvement Resilience  Sleep:  Number of Hours: 6.5  Cognition: WNL  ADL's:  Intact   Have you used any form of tobacco in the last 30 days? (Cigarettes, Smokeless Tobacco, Cigars, and/or Pipes): Yes  Has this patient used any form of tobacco in the last 30 days? (Cigarettes, Smokeless Tobacco, Cigars, and/or Pipes) Yes, A prescription for an FDA-approved tobacco cessation medication was offered at discharge and the patient refused  Mental Status Per Nursing Assessment::   On Admission:  Self-harm thoughts  Current Mental Status by Physician: At this time patient improved mood, currently euthymic, affect  appropriate, full in range, no thought disorder, no suicidal or homicidal ideations, no psychotic symptoms, future oriented   Loss Factors:  recent relapse on cocaine, had stopped going to NA groups   Historical Factors: Has been diagnosed with PTSD, history of Depression, history of cocaine abuse , alcohol abuse   Risk Reduction Factors:   Sense of responsibility to family, Positive social support and Positive coping skills or problem solving skills  Continued Clinical Symptoms:   as noted, at this time patient improved, currently euthymic, with full range of affect , no suicidal or homicidal ideations, future oriented  Cognitive Features That Contribute To Risk:  No gross cognitive deficits noted upon discharge. Is alert , attentive, and oriented x 3   Suicide Risk:  Mild:  Suicidal ideation of limited frequency, intensity, duration, and specificity.  There are no identifiable plans, no associated intent, mild dysphoria and related symptoms, good self-control (both objective and subjective assessment), few other risk factors, and identifiable protective factors, including available and accessible social support.  Principal Problem: MDD (major depressive disorder), recurrent severe, without psychosis [facility](HCC) Discharge Diagnoses:  Patient Active Problem List   Diagnosis Date Noted  . MDD (major depressive disorder), recurrent severe, without psychosis (HCC) [F33.2] 09/07/2015  . Major depressive disorder, recurrent, severe without psychotic features (HCC) [F33.2]   . Cocaine dependence (HCC) [F14.20] 08/31/2014  . Alcohol abuse [F10.10] 08/31/2014    Follow-up Information    [facility].   Why:  at 3:30pm with the psychiatrist. At this appointment  they will be able to make a referral for therapy   Contact information:   [address] [place], [place] [address] [phone] Fax: [phone]      Plan Of Care/Follow-up  recommendations:  Activity:  as tolerated  Diet:  Heart Healthy Tests:  NA Other:  see below   Is patient on multiple antipsychotic therapies at discharge:  No   Has Patient had three or more failed trials of antipsychotic monotherapy by history:  No  Recommended Plan for Multiple Antipsychotic Therapies: NA  Patient is requesting discharge and there are no current grounds for involuntary commitment  Patient plans to [facility] where he [facility] living Plans to follow up with his outpatient psychiatrist and PCP at [facility] clinic in [place] .    COBOS, FERNANDO 09/09/2015, 12:18 PM

## 2015-09-09 NOTE — BHH Group Notes (Signed)
The focus of this group is to educate the patient on the purpose and policies of crisis stabilization and provide a format to answer questions about their admission.  The group details unit policies and expectations of patients while admitted.  Patient did not attend 0900 nurse education orientation group this morning.  Patient stayed in bed.   
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
## 2015-09-09 NOTE — Progress Notes (Signed)
  [facility] :  Will you be returning to the same living situation after discharge:  Yes,  [facility] At discharge, do you have transportation home?: Yes,  Pt friend to provide transportation Do you have the ability to pay for your medications: Yes,  Pt provided with prescriptions  Release of information consent forms completed and in the chart;  Patient's signature needed at discharge.  Patient to Follow up at: Follow-up Information    [facility] 09[facility]/2015.   Why:  at 3:30pm with the psychiatrist. At this appointment they will be able to make a referral for therapy   Contact information:   [address], [place], [place] [id number] [phone] Fax: [phone]      Next level of care provider has access to [facility] Link:no  Patient denies SI/HI: Yes,  Pt denies    Safety Planning and Suicide Prevention discussed: Yes,  wtih Pt; declined family contact  Have you used any form of tobacco in the last 30 days? (Cigarettes, Smokeless Tobacco, Cigars, and/or Pipes): Yes  Has patient been referred to the Quitline?: Patient refused referral  Elaina HoopsCarter, Maylea Soria M 09/09/2015, 1:08 PM

## 2015-09-09 NOTE — Plan of Care (Signed)
Problem: Consults Goal: Depression Patient Education See Patient Education Module for education specifics.  Outcome: Progressing Nurse discussed depression/coping skills with patient.        

## 2015-09-09 NOTE — Tx Team (Signed)
Interdisciplinary Treatment Plan Update (Adult) Date: 09/09/2015   Date: 09/09/2015 1:01 PM  Progress in Treatment:  Attending groups: No Participating in groups: No Taking medication as prescribed: Yes  Tolerating medication: Yes  Family/Significant othe contact made: No, Pt declines  Patient understands diagnosis: Yes Discussing patient identified problems/goals with staff: Yes  Medical problems stabilized or resolved: Yes  Denies suicidal/homicidal ideation: Yes Patient has not harmed self or Others: Yes   New problem(s) identified: None identified at this time.   Discharge Plan or Barriers: Pt will return to his [facility] and follow-up with the [facility] [place].  Additional comments: n/a   Reason for Continuation of Hospitalization:  Anxiety Depression Medication stabilization Suicidal ideation  Estimated length of stay: 3-5 days  Review of initial/current patient goals per problem list:   1.  Goal(s): Patient will participate in aftercare plan  Met:  Yes  Target date: 3-5 days from date of admission   As evidenced by: Patient will participate within aftercare plan AEB aftercare provider and housing plan at discharge being identified.   09/07/15: Pt will return to [facility] and follow[facility]up with [facility]  2.  Goal (s): Patient will exhibit decreased depressive symptoms and suicidal ideations.  Met:  Adequate for DC  Target date: 3-5 days from date of admission   As evidenced by: Patient will utilize self rating of depression at 3 or below and demonstrate decreased signs of depression or be deemed stable for discharge by MD. 09/07/15: Pt was admitted with symptoms of depression, rating 10/10. Pt continues to present with flat affect and depressive symptoms.  Pt will demonstrate decreased symptoms of depression and rate depression at 3/10 or lower prior to discharge. 09/09/15: MD feels that Pt's symptoms have decreased to the point where they can be managed on an  outpatient basis.  3.  Goal(s): Patient will demonstrate decreased signs and symptoms of anxiety.  Met:  Adequate for DC  Target date: 3-5 days from date of admission   As evidenced by: Patient will utilize self rating of anxiety at 3 or below and demonstrated decreased signs of anxiety, or be deemed stable for discharge by MD 09/07/15: Pt was admitted with increased levels of anxiety and is currently rating those symptoms highly. Pt will demonstrated decreased symptoms of anxiety and rate it at 3/10 prior to d/c. 09/09/15: MD feels that Pt's symptoms have decreased to the point that they can be managed in an outpatient setting.  Attendees:  Patient:    Family:    Physician: Dr. Parke Poisson, MD  09/09/2015 1:01 PM  Nursing: Lars Pinks, RN Case manager  09/09/2015 1:01 PM  Clinical Social Worker Norman Clay, MSW 09/09/2015 1:01 PM  Other: Lucinda Dell, Beverly Sessions Liasion 09/09/2015 1:01 PM  Clinical: Grayland Ormond, RN 09/09/2015 1:01 PM  Other: , RN Charge Nurse 09/09/2015 1:01 PM  Other:     Peri Maris, [place] MSW

## 2015-09-09 NOTE — Progress Notes (Signed)
D:  Patient's self inventory sheet, patient slept good last night, sleep medication is helpful.  Good appetite, normal energy level, good concentration.  Rated depression, hopeless and anxiety #3.  Denied withdrawals.  Denied SI.  Denied physical problems.  Denied pain.  Goal is to discharge.  Plans to wait to talk to MD.  Does have discharge plans. A:  Medications administered per MD orders.  Safety maintained with 15 minute checks. R:  Denied SI and HI, contracts for safety.  Denied A/V hallucinations.  Safety maintained with 15 minute checks.

## 2015-09-09 NOTE — Progress Notes (Signed)
Discharge Note:  Patient denied SI and HI.  Denied A/V hallucinations.  Patient discharged home.  Suicide prevention information given and discussed with patient who stated he understood and had no questions.  Patient stated he received all his belongings, clothing, toiletries, misc items, sneakers, laces, cell phone, hat, keys, card, wallet, etc.  Patient stated he did not need prescriptions, has medications at home.  Patient stated he [facility].

## 2015-10-21 ENCOUNTER — Emergency Department (HOSPITAL_COMMUNITY): Payer: Medicare Other

## 2015-10-21 ENCOUNTER — Emergency Department (HOSPITAL_COMMUNITY)
Admission: EM | Admit: 2015-10-21 | Discharge: 2015-10-22 | Disposition: A | Discharge status: Home / Self Care | Payer: Medicare Other | Attending: Emergency Medicine | Admitting: Emergency Medicine

## 2015-10-21 ENCOUNTER — Encounter (HOSPITAL_COMMUNITY): Payer: Self-pay | Admitting: Emergency Medicine

## 2015-10-21 DIAGNOSIS — G4489 Other headache syndrome: Secondary | ICD-10-CM | POA: Insufficient documentation

## 2015-10-21 DIAGNOSIS — F17210 Nicotine dependence, cigarettes, uncomplicated: Secondary | ICD-10-CM | POA: Insufficient documentation

## 2015-10-21 DIAGNOSIS — F1410 Cocaine abuse, uncomplicated: Secondary | ICD-10-CM | POA: Diagnosis not present

## 2015-10-21 DIAGNOSIS — I10 Essential (primary) hypertension: Secondary | ICD-10-CM | POA: Diagnosis not present

## 2015-10-21 DIAGNOSIS — F419 Anxiety disorder, unspecified: Secondary | ICD-10-CM | POA: Diagnosis not present

## 2015-10-21 DIAGNOSIS — K219 Gastro-esophageal reflux disease without esophagitis: Secondary | ICD-10-CM | POA: Insufficient documentation

## 2015-10-21 DIAGNOSIS — F329 Major depressive disorder, single episode, unspecified: Secondary | ICD-10-CM | POA: Diagnosis not present

## 2015-10-21 DIAGNOSIS — Z8639 Personal history of other endocrine, nutritional and metabolic disease: Secondary | ICD-10-CM | POA: Diagnosis not present

## 2015-10-21 DIAGNOSIS — Z79899 Other long term (current) drug therapy: Secondary | ICD-10-CM | POA: Insufficient documentation

## 2015-10-21 DIAGNOSIS — R51 Headache: Secondary | ICD-10-CM | POA: Diagnosis present

## 2015-10-21 LAB — CBC
HEMATOCRIT: 38.4 % — AB (ref 39.0–52.0)
Hemoglobin: 12.7 g/dL — ABNORMAL LOW (ref 13.0–17.0)
MCH: 25.9 pg — ABNORMAL LOW (ref 26.0–34.0)
MCHC: 33.1 g/dL (ref 30.0–36.0)
MCV: 78.2 fL (ref 78.0–100.0)
PLATELETS: 362 10*3/uL (ref 150–400)
RBC: 4.91 MIL/uL (ref 4.22–5.81)
RDW: 14.8 % (ref 11.5–15.5)
WBC: 8.3 10*3/uL (ref 4.0–10.5)

## 2015-10-21 LAB — BASIC METABOLIC PANEL
Anion gap: 15 (ref 5–15)
BUN: 15 mg/dL (ref 6–20)
CHLORIDE: 103 mmol/L (ref 101–111)
CO2: 22 mmol/L (ref 22–32)
CREATININE: 1.22 mg/dL (ref 0.61–1.24)
Calcium: 9.8 mg/dL (ref 8.9–10.3)
GFR calc Af Amer: >60 mL/min (ref >60)
GFR calc non Af Amer: >60 mL/min (ref >60)
GLUCOSE: 95 mg/dL (ref 65–99)
Potassium: 4 mmol/L (ref 3.5–5.1)
SODIUM: 140 mmol/L (ref 135–145)

## 2015-10-21 LAB — I-STAT TROPONIN, ED: Troponin i, poc: 0 ng/mL (ref 0.00–0.08)

## 2015-10-21 MED ORDER — KETOROLAC TROMETHAMINE 60 MG/2ML IM SOLN
60.0000 mg | Freq: Once | INTRAMUSCULAR | Status: AC
  Administered 2015-10-21: 60 mg via INTRAMUSCULAR
  Filled 2015-10-21: qty 2

## 2015-10-21 MED ORDER — PROCHLORPERAZINE EDISYLATE 5 MG/ML IJ SOLN
10.0000 mg | Freq: Once | INTRAMUSCULAR | Status: AC
  Administered 2015-10-21: 10 mg via INTRAMUSCULAR
  Filled 2015-10-21: qty 2

## 2015-10-21 MED ORDER — DIPHENHYDRAMINE HCL 25 MG PO CAPS
25.0000 mg | ORAL_CAPSULE | Freq: Once | ORAL | Status: AC
  Administered 2015-10-21: 25 mg via ORAL
  Filled 2015-10-21: qty 1

## 2015-10-21 NOTE — ED Notes (Signed)
MD at bedside. 

## 2015-10-21 NOTE — ED Provider Notes (Signed)
CSN: [id number]     Arrival date & time 10/21/15  1658 History   First MD Initiated Contact with Patient 10/21/15 1940     Chief Complaint  Patient presents with  . crack use    . Numbness  . anxious       (Consider location/radiation/quality/duration/timing/severity/associated sxs/prior Treatment) HPI   Alexander Munoz is a 54 y.o. male who presents for evaluation of headache and left leg numbness. Symptoms occurred after using cocaine, all night. He got out of rehabilitation, yesterday after a 14 day stay, and "relapsed". He had a couple of beers. He denies nausea, vomiting, fever, chills or chest pain. He is seeking treatment for his headache only. He states that the numbness has resolved. He has a chronic problem with substance abuse. He denies fever, chills, cough, nausea or vomiting. There are no other known modifying factors.   Past Medical History  Diagnosis Date  . Migraines   . Hypertension   . Depression   . Anxiety   . GERD (gastroesophageal reflux disease)   . Lactose intolerance    History reviewed. No pertinent past surgical history. Family History  Problem Relation Age of Onset  . Cancer Other   . Hypertension Other   . Heart attack Other    Social History  Substance Use Topics  . Smoking status: Current Every Day Smoker -- 1.00 packs/day for 5 years    Types: Cigarettes  . Smokeless tobacco: None  . Alcohol Use: Yes     Comment: Alcohol 3x's a week     Review of Systems  All other systems reviewed and are negative.     Allergies  Lactose intolerance (gi)  Home Medications   Prior to Admission medications   Medication Sig Start Date End Date Taking? Authorizing Provider  buPROPion (WELLBUTRIN XL) 300 MG 24 hr tablet Take 300 mg by mouth daily.   Yes Historical Provider, MD  guaiFENesin (MUCINEX) 600 MG 12 hr tablet Take 600 mg by mouth 2 (two) times daily.   Yes Historical Provider, MD  hydrOXYzine (ATARAX/VISTARIL) 25 MG tablet Take 1 tablet  (25 mg total) by mouth 3 (three) times daily as needed for anxiety. 09/09/15  Yes Oneta Rackanika N Lewis, NP  lisinopril-hydrochlorothiazide (PRINZIDE,ZESTORETIC) 10-12.5 MG per tablet Take 1 tablet by mouth daily. 09/01/14  Yes Beau FannyJohn C Withrow, FNP  nicotine (NICODERM CQ - DOSED IN MG/24 HOURS) 21 mg/24hr patch Place 21 mg onto the skin daily.   Yes Historical Provider, MD  pantoprazole (PROTONIX) 40 MG tablet Take 1 tablet (40 mg total) by mouth daily. 09/09/15  Yes Oneta Rackanika N Lewis, NP  sildenafil (VIAGRA) 100 MG tablet Take 300 mg by mouth daily as needed for erectile dysfunction.   Yes Historical Provider, MD  traZODone (DESYREL) 50 MG tablet Take 1 tablet (50 mg total) by mouth at bedtime as needed for sleep. 09/09/15  Yes Oneta Rackanika N Lewis, NP  verapamil (CALAN-SR) 240 MG CR tablet Take 1 tablet (240 mg total) by mouth at bedtime. Patient taking differently: Take 240 mg by mouth daily.  09/09/15  Yes Oneta Rackanika N Lewis, NP  buPROPion (WELLBUTRIN) 75 MG tablet Take 2 tablets (150 mg total) by mouth daily. 09/09/15   Oneta Rackanika N Lewis, NP   BP 161/103 mmHg  Pulse 98  Temp(Src) 97.9 F (36.6 C) (Oral)  Resp 16  SpO2 98% Physical Exam  Constitutional: He is oriented to person, place, and time. He appears well-developed and well-nourished. No distress.  HENT:  Head: Normocephalic  and atraumatic.  Right Ear: External ear normal.  Left Ear: External ear normal.  Eyes: Conjunctivae and EOM are normal. Pupils are equal, round, and reactive to light.  Neck: Normal range of motion and phonation normal. Neck supple.  Cardiovascular: Normal rate and regular rhythm.   Pulmonary/Chest: Effort normal. He exhibits no bony tenderness.  Musculoskeletal: Normal range of motion.  Neurological: He is alert and oriented to person, place, and time. No cranial nerve deficit or sensory deficit. He exhibits normal muscle tone. Coordination normal.  Skin: Skin is warm, dry and intact.  Psychiatric: He has a normal mood and  affect. His behavior is normal. Judgment and thought content normal.  Nursing note and vitals reviewed.   ED Course  Procedures (including critical care time)  Medications  prochlorperazine (COMPAZINE) injection 10 mg (10 mg Intramuscular Given 10/21/15 2218)  ketorolac (TORADOL) injection 60 mg (60 mg Intramuscular Given 10/21/15 2218)  diphenhydrAMINE (BENADRYL) capsule 25 mg (25 mg Oral Given 10/21/15 2218)    Patient Vitals for the past 24 hrs:  BP Temp Temp src Pulse Resp SpO2  10/21/15 2224 (!) 161/103 mmHg - - 98 16 98 %  10/21/15 2116 (!) 161/111 mmHg 97.9 F (36.6 C) Oral 95 18 96 %  10/21/15 1706 (!) 185/118 mmHg 97.6 F (36.4 C) Oral 88 17 98 %    11:28 PM Reevaluation with update and discussion. After initial assessment and treatment, an updated evaluation reveals headache, resolved. He has no further complaints. Findings discussed with the patient, all questions were answered. Ameyah Bangura L     Labs Review Labs Reviewed  CBC - Abnormal; Notable for the following:    Hemoglobin 12.7 (*)    HCT 38.4 (*)    MCH 25.9 (*)    All other components within normal limits  BASIC METABOLIC PANEL  I-STAT TROPOININ, ED    Imaging Review Dg Chest 2 View  10/21/2015  CLINICAL DATA:  Pt states that he got out of rehab yesterday and started smoking crack again today. Pt c/o left arm and left leg numbness, anxiety. Headache today. Hx htn. EXAM: CHEST  2 VIEW COMPARISON:  03/02/2006 FINDINGS: The heart size and mediastinal contours are within normal limits. Both lungs are clear. No pleural effusion or pneumothorax. The visualized skeletal structures are unremarkable. IMPRESSION: No active cardiopulmonary disease. Electronically Signed   By: Amie Portland M.D.   On: 10/21/2015 18:21   I have personally reviewed and evaluated these images and lab results as part of my medical decision-making.   EKG Interpretation   Date/Time:  Thursday October 21 2015 17:04:02  EST Ventricular Rate:  88 PR Interval:  154 QRS Duration: 87 QT Interval:  401 QTC Calculation: 485 R Axis:   40 Text Interpretation:  Sinus rhythm Abnormal R-wave progression, early  transition Borderline prolonged QT interval since last tracing no  significant change Confirmed by Effie Shy  MD, Tyreesha Maharaj ([id number]) on 10/21/2015  5:24:20 PM      MDM   Final diagnoses:  Other headache syndrome  Cocaine abuse    Nonspecific headache, with cocaine abuse. CVA, fracture or intracranial bleeding.   Nursing Notes Reviewed/ Care Coordinated Applicable Imaging Reviewed Interpretation of Laboratory Data incorporated into ED treatment  The patient appears reasonably screened and/or stabilized for discharge and I doubt any other medical condition or other [facility] requiring further screening, evaluation, or treatment in the ED at this time prior to discharge.  Plan: Home Medications- none; Home Treatments- rest; return here if the  recommended treatment, does not improve the symptoms; Recommended follow up- PCP prn     Mancel Bale, MD 10/21/15 2329

## 2015-10-21 NOTE — ED Notes (Signed)
Pt states that he got out of rehab yesterday and started smoking crack again today. Pt c/o left arm and left leg numbness, anxiety.

## 2015-10-21 NOTE — Discharge Instructions (Signed)
General Headache Without Cause A headache is pain or discomfort felt around the head or neck area. The specific cause of a headache may not be found. There are many causes and types of headaches. A few common ones are:  Tension headaches.  Migraine headaches.  Cluster headaches.  Chronic daily headaches. HOME CARE INSTRUCTIONS  Watch your condition for any changes. Take these steps to help with your condition: Managing Pain  Take over-the-counter and prescription medicines only as told by your health care provider.  Lie down in a dark, quiet room when you have a headache.  If directed, apply ice to the head and neck area:  Put ice in a plastic bag.  Place a towel between your skin and the bag.  Leave the ice on for 20 minutes, 2-3 times per day.  Use a heating pad or hot shower to apply heat to the head and neck area as told by your health care provider.  Keep lights dim if bright lights bother you or make your headaches worse. Eating and Drinking  Eat meals on a regular schedule.  Limit alcohol use.  Decrease the amount of caffeine you drink, or stop drinking caffeine. General Instructions  Keep all follow-up visits as told by your health care provider. This is important.  Keep a headache journal to help find out what may trigger your headaches. For example, write down:  What you eat and drink.  How much sleep you get.  Any change to your diet or medicines.  Try massage or other relaxation techniques.  Limit stress.  Sit up straight, and do not tense your muscles.  Do not use tobacco products, including cigarettes, chewing tobacco, or e-cigarettes. If you need help quitting, ask your health care provider.  Exercise regularly as told by your health care provider.  Sleep on a regular schedule. Get 7-9 hours of sleep, or the amount recommended by your health care provider. SEEK MEDICAL CARE IF:   Your symptoms are not helped by medicine.  You have a  headache that is different from the usual headache.  You have nausea or you vomit.  You have a fever. SEEK IMMEDIATE MEDICAL CARE IF:   Your headache becomes severe.  You have repeated vomiting.  You have a stiff neck.  You have a loss of vision.  You have problems with speech.  You have pain in the eye or ear.  You have muscular weakness or loss of muscle control.  You lose your balance or have trouble walking.  You feel faint or pass out.  You have confusion.   This information is not intended to replace advice given to you by your health care provider. Make sure you discuss any questions you have with your health care provider.   Document Released: 10/16/2005 Document Revised: 07/07/2015 Document Reviewed: 02/08/2015 Elsevier Interactive Patient Education 2016 Elsevier Inc.  Stimulant Use Disorder-Cocaine Cocaine is one of a group of powerful drugs called stimulants. Cocaine has medical uses for stopping nosebleeds and for pain control before minor nose or dental surgery. However, cocaine is misused because of the effects that it produces. These effects include:   A feeling of extreme pleasure.  Alertness.  High energy. Common street names for cocaine include coke, crack, blow, snow, and nose candy. Cocaine is snorted, dissolved in water and injected, or smoked.  Stimulants are addictive because they activate regions of the brain that produce both the pleasurable sensation of "reward" and psychological dependence. Together, these actions account for  loss of control and the rapid development of drug dependence. This means you become ill without the drug (withdrawal) and need to keep using it to function.  Stimulant use disorder is use of stimulants that disrupts your daily life. It disrupts relationships with family and friends and how you do your job. Cocaine increases your blood pressure and heart rate. It can cause a heart attack or stroke. Cocaine can also cause death  from irregular heart rate or seizures. SYMPTOMS Symptoms of stimulant use disorder with cocaine include:  Use of cocaine in larger amounts or over a longer period of time than intended.  Unsuccessful attempts to cut down or control cocaine use.  A lot of time spent obtaining, using, or recovering from the effects of cocaine.  A strong desire or urge to use cocaine (craving).  Continued use of cocaine in spite of major problems at work, school, or home because of use.  Continued use of cocaine in spite of relationship problems because of use.  Giving up or cutting down on important life activities because of cocaine use.  Use of cocaine over and over in situations when it is physically hazardous, such as driving a car.  Continued use of cocaine in spite of a physical problem that is likely related to use. Physical problems can include:  Malnutrition.  Nosebleeds.  Chest pain.  High blood pressure.  A hole that develops between the part of your nose that separates your nostrils (perforated nasal septum).  Lung and kidney damage.  Continued use of cocaine in spite of a mental problem that is likely related to use. Mental problems can include:  Schizophrenia-like symptoms.  Depression.  Bipolar mood swings.  Anxiety.  Sleep problems.  Need to use more and more cocaine to get the same effect, or lessened effect over time with use of the same amount of cocaine (tolerance).  Having withdrawal symptoms when cocaine use is stopped, or using cocaine to reduce or avoid withdrawal symptoms. Withdrawal symptoms include:  Depressed or irritable mood.  Low energy or restlessness.  Bad dreams.  Poor or excessive sleep.  Increased appetite. DIAGNOSIS Stimulant use disorder is diagnosed by your health care provider. You may be asked questions about your cocaine use and how it affects your life. A physical exam may be done. A drug screen may be ordered. You may be referred to  a mental health professional. The diagnosis of stimulant use disorder requires at least two symptoms within 12 months. The type of stimulant use disorder depends on the number of signs and symptoms you have. The type may be:  Mild. Two or three signs and symptoms.  Moderate. Four or five signs and symptoms.  Severe. Six or more signs and symptoms. TREATMENT Treatment for stimulant use disorder is usually provided by mental health professionals with training in substance use disorders. The following options are available:  Counseling or talk therapy. Talk therapy addresses the reasons you use cocaine and ways to keep you from using again. Goals of talk therapy include:  Identifying and avoiding triggers for use.  Handling cravings.  Replacing use with healthy activities.  Support groups. Support groups provide emotional support, advice, and guidance.  Medicine. Certain medicines may decrease cocaine cravings or withdrawal symptoms. HOME CARE INSTRUCTIONS  Take medicines only as directed by your health care provider.  Identify the people and activities that trigger your cocaine use and avoid them.  Keep all follow-up visits as directed by your health care provider. SEEK MEDICAL  CARE IF:  Your symptoms get worse or you relapse.  You are not able to take medicines as directed. SEEK IMMEDIATE MEDICAL CARE IF:  You have serious thoughts about hurting yourself or others.  You have a seizure, chest pain, sudden weakness, or loss of speech or vision. FOR MORE INFORMATION  National Institute on Drug Abuse: http://www.price-smith.com/www.drugabuse.gov  Substance Abuse and Mental Health Services Administration: SkateOasis.com.ptwww.samhsa.gov   This information is not intended to replace advice given to you by your health care provider. Make sure you discuss any questions you have with your health care provider.   Document Released: 10/13/2000 Document Revised: 11/06/2014 Document Reviewed: 10/29/2013 Elsevier Interactive  Patient Education [employer]2016 Elsevier Inc.

## 2016-10-02 ENCOUNTER — Encounter (HOSPITAL_COMMUNITY): Payer: Self-pay | Admitting: Emergency Medicine

## 2016-10-02 ENCOUNTER — Inpatient Hospital Stay (HOSPITAL_COMMUNITY)
Admission: RE | Admit: 2016-10-02 | Discharge: 2016-10-05 | DRG: 885 | Disposition: A | Discharge status: Home / Self Care | Payer: Medicare Other | Attending: Psychiatry | Admitting: Psychiatry

## 2016-10-02 ENCOUNTER — Emergency Department (HOSPITAL_COMMUNITY)
Admission: EM | Admit: 2016-10-02 | Discharge: 2016-10-02 | Disposition: A | Discharge status: Other Acute Inpatient Hospital | Payer: Non-veteran care | Attending: Emergency Medicine | Admitting: Emergency Medicine

## 2016-10-02 ENCOUNTER — Encounter (HOSPITAL_COMMUNITY): Payer: Self-pay

## 2016-10-02 DIAGNOSIS — Z9114 Patient's other noncompliance with medication regimen: Secondary | ICD-10-CM

## 2016-10-02 DIAGNOSIS — K219 Gastro-esophageal reflux disease without esophagitis: Secondary | ICD-10-CM | POA: Diagnosis present

## 2016-10-02 DIAGNOSIS — Z8249 Family history of ischemic heart disease and other diseases of the circulatory system: Secondary | ICD-10-CM | POA: Diagnosis not present

## 2016-10-02 DIAGNOSIS — Z79899 Other long term (current) drug therapy: Secondary | ICD-10-CM

## 2016-10-02 DIAGNOSIS — F17210 Nicotine dependence, cigarettes, uncomplicated: Secondary | ICD-10-CM | POA: Diagnosis present

## 2016-10-02 DIAGNOSIS — G4700 Insomnia, unspecified: Secondary | ICD-10-CM | POA: Diagnosis present

## 2016-10-02 DIAGNOSIS — F1420 Cocaine dependence, uncomplicated: Secondary | ICD-10-CM | POA: Diagnosis present

## 2016-10-02 DIAGNOSIS — F1410 Cocaine abuse, uncomplicated: Secondary | ICD-10-CM | POA: Insufficient documentation

## 2016-10-02 DIAGNOSIS — F332 Major depressive disorder, recurrent severe without psychotic features: Secondary | ICD-10-CM | POA: Diagnosis present

## 2016-10-02 DIAGNOSIS — R45851 Suicidal ideations: Secondary | ICD-10-CM | POA: Diagnosis present

## 2016-10-02 DIAGNOSIS — I10 Essential (primary) hypertension: Secondary | ICD-10-CM | POA: Diagnosis present

## 2016-10-02 DIAGNOSIS — Z91011 Allergy to milk products: Secondary | ICD-10-CM | POA: Diagnosis not present

## 2016-10-02 DIAGNOSIS — F329 Major depressive disorder, single episode, unspecified: Secondary | ICD-10-CM | POA: Insufficient documentation

## 2016-10-02 DIAGNOSIS — F1010 Alcohol abuse, uncomplicated: Secondary | ICD-10-CM | POA: Diagnosis present

## 2016-10-02 LAB — RAPID URINE DRUG SCREEN, HOSP PERFORMED
Amphetamines: NOT DETECTED
BENZODIAZEPINES: NOT DETECTED
Barbiturates: NOT DETECTED
Cocaine: POSITIVE — AB
Opiates: NOT DETECTED
Tetrahydrocannabinol: NOT DETECTED

## 2016-10-02 LAB — COMPREHENSIVE METABOLIC PANEL
ALT: 23 U/L (ref 17–63)
AST: 29 U/L (ref 15–41)
Albumin: 4.3 g/dL (ref 3.5–5.0)
Alkaline Phosphatase: 66 U/L (ref 38–126)
Anion gap: 8 (ref 5–15)
BILIRUBIN TOTAL: 0.4 mg/dL (ref 0.3–1.2)
BUN: 15 mg/dL (ref 6–20)
CO2: 25 mmol/L (ref 22–32)
Calcium: 8.7 mg/dL — ABNORMAL LOW (ref 8.9–10.3)
Chloride: 110 mmol/L (ref 101–111)
Creatinine, Ser: 1.3 mg/dL — ABNORMAL HIGH (ref 0.61–1.24)
GFR, EST NON AFRICAN AMERICAN: 60 mL/min — AB (ref >60)
Glucose, Bld: 89 mg/dL (ref 65–99)
POTASSIUM: 4 mmol/L (ref 3.5–5.1)
Sodium: 143 mmol/L (ref 135–145)
TOTAL PROTEIN: 7.2 g/dL (ref 6.5–8.1)

## 2016-10-02 LAB — CBC
HCT: 39 % (ref 39.0–52.0)
Hemoglobin: 12.8 g/dL — ABNORMAL LOW (ref 13.0–17.0)
MCH: 26.4 pg (ref 26.0–34.0)
MCHC: 32.8 g/dL (ref 30.0–36.0)
MCV: 80.4 fL (ref 78.0–100.0)
PLATELETS: 399 10*3/uL (ref 150–400)
RBC: 4.85 MIL/uL (ref 4.22–5.81)
RDW: 15.3 % (ref 11.5–15.5)
WBC: 8.2 10*3/uL (ref 4.0–10.5)

## 2016-10-02 LAB — ACETAMINOPHEN LEVEL: Acetaminophen (Tylenol), Serum: <10 ug/mL — ABNORMAL LOW (ref 10–30)

## 2016-10-02 LAB — ETHANOL

## 2016-10-02 LAB — SALICYLATE LEVEL

## 2016-10-02 MED ORDER — TRAZODONE HCL 50 MG PO TABS
50.0000 mg | ORAL_TABLET | Freq: Every evening | ORAL | Status: DC | PRN
  Administered 2016-10-02 – 2016-10-05 (×5): 50 mg via ORAL
  Filled 2016-10-02 (×12): qty 1

## 2016-10-02 MED ORDER — ACETAMINOPHEN 325 MG PO TABS
650.0000 mg | ORAL_TABLET | Freq: Four times a day (QID) | ORAL | Status: DC | PRN
Start: 2016-10-02 — End: 2016-10-05

## 2016-10-02 MED ORDER — ALUM & MAG HYDROXIDE-SIMETH 200-200-20 MG/5ML PO SUSP: 30.0000 mL | ORAL | Status: DC | PRN

## 2016-10-02 MED ORDER — MAGNESIUM HYDROXIDE 400 MG/5ML PO SUSP
30.0000 mL | Freq: Every day | ORAL | Status: DC | PRN
Start: 2016-10-02 — End: 2016-10-05

## 2016-10-02 MED ORDER — NICOTINE 21 MG/24HR TD PT24
21.0000 mg | MEDICATED_PATCH | Freq: Every day | TRANSDERMAL | Status: DC
  Administered 2016-10-03 – 2016-10-05 (×3): 21 mg via TRANSDERMAL
  Filled 2016-10-02 (×4): qty 1

## 2016-10-02 NOTE — ED Provider Notes (Signed)
[facility]-EMERGENCY DEPT Provider Note   CSN: [id number] Arrival date & time: 10/02/16  1245     History   Chief Complaint Chief Complaint  Patient presents with  . Suicidal    HPI Alexander Munoz is a 55 y.o. male.  HPI Patient presents with suicidal thoughts. Plan to drive off a bridge or off the road. Also has had some slight hallucinations. Previous suicide attempt in October. He overdosed at that time. He has been a chronic cocaine user and had been clean for about 40 days but relapsed around 4 days ago. States some hallucinations and depression got worse and time. Denies suicide attempt. Has been seen at behavioral health and pending medical clearance accepted to a bed. [facility]. States he's been off his medications for the last few days. Past Medical History:  Diagnosis Date  . Anxiety   . Depression   . GERD (gastroesophageal reflux disease)   . Hypertension   . Lactose intolerance   . Migraines     Patient Active Problem List   Diagnosis Date Noted  . MDD (major depressive disorder), recurrent severe, without psychosis (HCC) 09/07/2015  . Major depressive disorder, recurrent, severe without psychotic features (HCC)   . Cocaine dependence (HCC) 08/31/2014  . Alcohol abuse 08/31/2014    History reviewed. No pertinent surgical history.     Home Medications    Prior to Admission medications   Medication Sig Start Date End Date Taking? Authorizing Provider  buPROPion (WELLBUTRIN XL) 300 MG 24 hr tablet Take 300 mg by mouth daily.   Yes Historical Provider, MD  hydrOXYzine (ATARAX/VISTARIL) 25 MG tablet Take 1 tablet (25 mg total) by mouth 3 (three) times daily as needed for anxiety. Patient taking differently: Take 50 mg by mouth 2 (two) times daily as needed for anxiety.  09/09/15  Yes Oneta Rackanika N Lewis, NP  lisinopril-hydrochlorothiazide (PRINZIDE,ZESTORETIC) 20-12.5 MG tablet Take 1 tablet by mouth daily.   Yes Historical Provider, MD  Melatonin  3 MG TABS Take 6 mg by mouth at bedtime as needed (sleep).   Yes Historical Provider, MD  nicotine (NICODERM CQ - DOSED IN MG/24 HOURS) 21 mg/24hr patch Place 21 mg onto the skin daily.   Yes Historical Provider, MD  pantoprazole (PROTONIX) 40 MG tablet Take 1 tablet (40 mg total) by mouth daily. 09/09/15  Yes Oneta Rackanika N Lewis, NP  sildenafil (VIAGRA) 100 MG tablet Take 300 mg by mouth daily as needed for erectile dysfunction.   Yes Historical Provider, MD  lisinopril-hydrochlorothiazide (PRINZIDE,ZESTORETIC) 10-12.5 MG per tablet Take 1 tablet by mouth daily. Patient not taking: Reported on 10/02/2016 09/01/14   Beau FannyJohn C Withrow, FNP  traZODone (DESYREL) 50 MG tablet Take 1 tablet (50 mg total) by mouth at bedtime as needed for sleep. Patient not taking: Reported on 10/02/2016 09/09/15   Oneta Rackanika N Lewis, NP  verapamil (CALAN-SR) 240 MG CR tablet Take 1 tablet (240 mg total) by mouth at bedtime. Patient not taking: Reported on 10/02/2016 09/09/15   Oneta Rackanika N Lewis, NP    Family History Family History  Problem Relation Age of Onset  . Cancer Other   . Hypertension Other   . Heart attack Other     Social History Social History  Substance Use Topics  . Smoking status: Current Every Day Smoker    Packs/day: 1.00    Years: 5.00    Types: Cigarettes  . Smokeless tobacco: Not on file  . Alcohol use Yes  Comment: Alcohol 3x's a week      Allergies   Lactose intolerance (gi)   Review of Systems Review of Systems  Constitutional: Negative for appetite change and unexpected weight change.  Cardiovascular: Negative for chest pain.  Gastrointestinal: Negative for abdominal pain.  Genitourinary: Negative for frequency.  Musculoskeletal: Negative for arthralgias.  Skin: Negative for wound.  Neurological: Negative for syncope.  Psychiatric/Behavioral: Positive for hallucinations and suicidal ideas.     Physical Exam Updated Vital Signs BP 144/93 (BP Location: Right Arm)   Pulse 77    Temp 97.3 F (36.3 C) (Oral)   Resp 18   SpO2 95%   Physical Exam  Constitutional: He appears well-developed.  HENT:  Head: Normocephalic.  Eyes: Pupils are equal, round, and reactive to light.  Neck: Neck supple.  Cardiovascular: Normal rate.   Pulmonary/Chest: Effort normal.  Abdominal: Soft.  Musculoskeletal: He exhibits no edema.  Neurological: He is alert.  Skin: Skin is warm. Capillary refill takes less than 2 seconds.  Psychiatric: He has a normal mood and affect.     ED Treatments / Results  Labs (all labs ordered are listed, but only abnormal results are displayed) Labs Reviewed  COMPREHENSIVE METABOLIC PANEL - Abnormal; Notable for the following:       Result Value   Creatinine, Ser 1.30 (*)    Calcium 8.7 (*)    GFR calc non Af Amer 60 (*)    All other components within normal limits  ACETAMINOPHEN LEVEL - Abnormal; Notable for the following:    Acetaminophen (Tylenol), Serum <10 (*)    All other components within normal limits  CBC - Abnormal; Notable for the following:    Hemoglobin 12.8 (*)    All other components within normal limits  ETHANOL  SALICYLATE LEVEL  RAPID URINE DRUG SCREEN, HOSP PERFORMED    EKG  EKG Interpretation None       Radiology No results found.  Procedures Procedures (including critical care time)  Medications Ordered in ED Medications - No data to display   Initial Impression / Assessment and Plan / ED Course  I have reviewed the triage vital signs and the nursing notes.  Pertinent labs & imaging results that were available during my care of the patient were reviewed by me and considered in my medical decision making (see chart for details).  Clinical Course     Patient with depression and substance abuse. Medically cleared at this time. Urine drug screen was still pending last time I checked. Patient has been accepted Behavioral Health [address] hallway  Final Clinical Impressions(s) / ED Diagnoses   Final  diagnoses:  Suicidal ideations  Cocaine abuse    New Prescriptions New Prescriptions   No medications on file     Benjiman CoreNathan Brooklyne Radke, MD 10/02/16 1431

## 2016-10-02 NOTE — Progress Notes (Addendum)
Patient ID: Molinda BailiffJohn Hadaway, male   DOB: 04-29-1961, 55 y.o.   MRN: [id number]  55 year [facility] help for suicidal ideation to drive off a bridge and detox from crack cocaine. Pt reports that this weekend is the third anniversary of his girlfriends death and that he has been struggling with his grief. Pt reports he started using crack cocaine 4 days ago after a period of sobriety. Pt is a veteran being [facility] [place]. Pt wishes to  "sharpen up some of my coping skills" and "take my medication" [facility]. Pt reports a history of GERD, HTN, Depression and PTSD like symptoms. Pt reports a previous suicide attempt by OD in October. Pt endorses difficulty sleeping recently but denies any current weight loss. Pt is lactose intolerant.   Consents signed, skin/belongings search completed and pt oriented to unit. Pt stable at this time. Pt given the opportunity to express concerns and ask questions. Pt given toiletries. Pt currently denies SI/HI and A/V hallucinations. Pt verbally agrees to seek staff if SI/HI or A/VH occurs and to consult with staff before acting on any harmful thoughts. Pt currently lying in bed. Will continue POC.   *Admission completed by chart review, assessment by admitting nurse, Otto Herbreka, RN and writers own assessment.

## 2016-10-02 NOTE — ED Notes (Signed)
Pt is dressed into scrubs and been wanded by security.

## 2016-10-02 NOTE — Tx Team (Signed)
Initial Treatment Plan 10/02/2016 8:52 PM Alexander BailiffJohn Tennyson VWU:[id number]    PATIENT STRESSORS: Loss of girlfriend, 3 years ago Substance abuse Other: insomnia   PATIENT STRENGTHS: Ability for insight Capable of independent living Communication skills General fund of knowledge   PATIENT IDENTIFIED PROBLEMS: Depression  Suicide Risk  Substance abuse - "crack cocaine"  "sharpen up on some of my coping skills"  "take my medication"             DISCHARGE CRITERIA:  Ability to meet basic life and health needs Improved stabilization in mood, thinking, and/or behavior Safe-care adequate arrangements made Verbal commitment to aftercare and medication compliance  PRELIMINARY DISCHARGE PLAN: Attend 12-step recovery group Outpatient therapy Return to previous living arrangement  PATIENT/FAMILY INVOLVEMENT: This treatment plan has been presented to and reviewed with the patient, Alexander BailiffJohn Goings .  The patient and family have been given the opportunity to ask questions and make suggestions.  Aurora Maskwyman, Elanore Talcott E, RN 10/02/2016, 8:52 PM

## 2016-10-02 NOTE — ED Notes (Signed)
Per Mardella LaymanLindsey, medical clearance-crack use-last use yesterday-[facility]

## 2016-10-02 NOTE — H&P (Signed)
Behavioral Health Medical Screening Exam  Alexander BailiffJohn Munoz is an 55 y.o. male who presents as a walk in reporting suicidal thoughts with plan and cocaine abuse. Patient reports medical history of Hypertension, acid reflux. He is unable to contract for his safety at this time. Patient will be sent for medical clearance and plan for inpatient admission to the 300 hall.   Total Time spent with patient: 20 minutes  Psychiatric Specialty Exam: Physical Exam  Constitutional: He is oriented to person, place, and time. He appears well-developed and well-nourished.  HENT:  Head: Normocephalic and atraumatic.  Neck: Normal range of motion.  Cardiovascular: Normal rate, regular rhythm, normal heart sounds and intact distal pulses.   Respiratory: Effort normal and breath sounds normal.  GI: Soft. Bowel sounds are normal.  Musculoskeletal: Normal range of motion.  Neurological: He is alert and oriented to person, place, and time.  Skin: Skin is warm and dry.    Review of Systems  Psychiatric/Behavioral: Positive for depression, substance abuse and suicidal ideas.    Blood pressure 131/85, pulse 65, temperature 98.5 F (36.9 C), resp. rate 16.There is no height or weight on file to calculate BMI.  General Appearance: Casual  Eye Contact:  Fair  Speech:  Clear and Coherent  Volume:  Normal  Mood:  Dysphoric  Affect:  Congruent  Thought Process:  Coherent and Goal Directed  Orientation:  Full (Time, Place, and Person)  Thought Content:  Cocaine abuse, Depressive symptoms   Suicidal Thoughts:  Yes.  with intent/plan  Homicidal Thoughts:  No  Memory:  Immediate;   Good Recent;   Good Remote;   Good  Judgement:  Poor  Insight:  Present  Psychomotor Activity:  Decreased  Concentration: Concentration: Good and Attention Span: Fair  Recall:  [employer]is[employer]Fair  Fund of Knowledge:Good  Language: Good  Akathisia:  No  Handed:  Right  AIMS (if indicated):     Assets:  Communication Skills Desire for  Improvement Financial Resources/Insurance Housing Resilience  Sleep:       Musculoskeletal: Strength & Muscle Tone: within normal limits Gait & Station: normal Patient leans: N/A  Blood pressure 131/85, pulse 65, temperature 98.5 F (36.9 C), resp. rate 16.  Recommendations:  Based on my evaluation the patient does not appear to have an emergency medical condition.  Fransisca KaufmannAVIS, Nedra Mcinnis, NP 10/02/2016, 12:08 PM

## 2016-10-02 NOTE — Progress Notes (Signed)
Patient ID: Alexander BailiffJohn Munoz, male   DOB: [birth year], 55 y.o.   MRN: [id number]   Pt currently presents with a flat affect and depressed behavior. Pt reports to writer that their goal is to "get some sleep." Pt states "I just don't feel good in the head right now." Pt reports good sleep with current medication regimen. Pt remains in bed all night, forwards little to Clinical research associatewriter.   Pt provided with medications per providers orders. Pt's labs and vitals were monitored throughout the night. Pt supported emotionally and encouraged to express concerns and questions. Pt educated on medications. Encouraged pt to participate in groups.   Pt's safety ensured with 15 minute and environmental checks. Pt currently denies SI/HI and A/V hallucinations. Pt verbally agrees to seek staff if SI/HI or A/VH occurs and to consult with staff before acting on any harmful thoughts. Will continue POC.

## 2016-10-02 NOTE — BH Assessment (Signed)
Writer informed Press photographerCharge Nurse Misty Stanley(Stacey) that the patient meets criteria for inpatient hospitalization after he is medically cleared, per Fransisca KaufmannLaura Davis, NP.

## 2016-10-02 NOTE — BH Assessment (Signed)
[facility] Assessment Progress Note  Per Fransisca KaufmannLaura Davis, NP, this pt requires psychiatric hospitalization at this time.  Lillia AbedLindsay, RN, [facility], once pt is medically cleared.  Pt has signed Voluntary Admission and Consent for Treatment, as well as Consent to Release Information [facility], and a notification call has been placed.  S[facility].  Pt's nurse has been notified, and agrees to send original paperwork along with pt via [place], and to call report to [phone].  Doylene Canninghomas Shalaina Guardiola, MA Triage Specialist [phone]

## 2016-10-02 NOTE — Progress Notes (Signed)
Psychoeducational Group Note  Date:  10/02/2016 Time:  2323  Group Topic/Focus:  Wrap-Up Group:   The focus of this group is to help patients review their daily goal of treatment and discuss progress on daily workbooks.   Participation Level: Did Not Attend  Participation Quality:  Not Applicable  Affect:  Not Applicable  Cognitive:  Not Applicable  Insight:  Not Applicable  Engagement in Group: Not Applicable  Additional Comments:  The patient did not attend the evening A.A. Meeting since he remained in bed.    Hazle CocaGOODMAN, Berish Bohman S 10/02/2016, 11:23 PM

## 2016-10-02 NOTE — Progress Notes (Signed)
Patient ID: Alexander BailiffJohn Munoz, male   DOB: June 30, 1961, 55 y.o.   MRN: [id number] Per State regulations 482.30 this chart was reviewed for medical necessity with respect to the patient's admission/duration of stay.    Next review date: 10/06/16  Alexander CoyerEric Hillard Munoz, BSN, RN-BC  Case Manager

## 2016-10-02 NOTE — BH Assessment (Signed)
Assessment Note  Patient is a 55 year old African American male that is a walk at [facility].  Patient reports that he came to [facility] alone for an assessment.   Patient reports SI with a plan to drive his car off the road.  Patient reports inpatient hospitalization in October 2017 when he overdosed on his sleeping pills and was sent to ICU.    Patient reports increased depression associated with his inability to stop abusing cocaine.  Patient also reports increased depression regarding seeing too many bad things when he was in the [employer] for years.  Patient reports that he has been diagnosed with PTSD.   Patient reports that he recently relapsed on cocaine.  Patient reports that his last use of cocaine was yesterday when he spend $80.00 on cocaine.  Patient reports that he has been addicted to cocaine since he was 55 years old.  Patient denies withdrawal symptoms and seizures.      Patient reports receiving outpatient mental health and substance abuse services at the [place] in [place].  Patient reports that he has not taken his psychiatric medication in a week.    Patient denies HI and psychosis.  Patient denies physical, sexual or emotional ab   Diagnosis: Major Depressive Disorder, Cannabis Abuse   Past Medical History:  Past Medical History:  Diagnosis Date  . Anxiety   . Depression   . GERD (gastroesophageal reflux disease)   . Hypertension   . Lactose intolerance   . Migraines     No past surgical history on file.  Family History:  Family History  Problem Relation Age of Onset  . Cancer Other   . Hypertension Other   . Heart attack Other     Social History:  reports that he has been smoking Cigarettes.  He has a 5.00 pack-year smoking history. He does not have any smokeless tobacco history on file. He reports that he drinks alcohol. He reports that he uses drugs, including Cocaine.  Additional Social History:  Alcohol / Drug Use History of alcohol / drug use?: Yes  CIWA:  CIWA-Ar BP: 131/85 Pulse Rate: 65 COWS:    Allergies:  Allergies  Allergen Reactions  . Lactose Intolerance (Gi) Other (See Comments)    Gi upset     Home Medications:  (Not in a hospital admission)  OB/GYN Status:  No LMP for male patient.  General Assessment Data Location of Assessment: [facility] Assessment Services (Walk In at [facility] ) TTS Assessment: In system Is this a Tele or Face-to-Face Assessment?: Face-to-Face Is this an Initial Assessment or a Re-assessment for this encounter?: Initial Assessment Marital status: Separated Maiden name: NA Is patient pregnant?: No Pregnancy Status: No Living Arrangements: Alone Can pt return to current living arrangement?: Yes Admission Status: Voluntary Is patient capable of signing voluntary admission?: Yes Referral Source: Self/Family/Friend Insurance type: Nurse, learning disability Exam ([facility] Walk-in ONLY) Medical Exam completed: Yes Fransisca Kaufmann, NP - completed MSE Exams)  Crisis Care Plan Living Arrangements: Alone Legal Guardian:  (NA) [facility] [place]  [facility]rapist[facility] [place]  Education Status Is patient currently in school?: No Current Grade: NA Highest grade of school patient has completed: NA Name of school: NA Contact person: NA  Risk to self with the past 6 months Suicidal Ideation: Yes-Currently Present Has patient been a risk to self within the past 6 months prior to admission? : Yes Suicidal Intent: Yes-Currently Present Has patient had any suicidal  intent within the past 6 months prior to admission? : Yes Is patient at risk for suicide?: Yes Suicidal Plan?: Yes-Currently Present Has patient had any suicidal plan within the past 6 months prior to admission? : Yes Specify Current Suicidal Plan: Plan to drive his car off the road Access to Means: Yes Specify Access to Suicidal Means: Car [facility] What has been your use of drugs/alcohol  within the last 12 months?: Cocaine Previous Attempts/Gestures: Yes How many times?: 1 Other Self Harm Risks: None Reported Triggers for Past Attempts: Unpredictable Intentional Self Injurious Behavior: None Family Suicide History: No Recent stressful life event(s):  (Unable to stop using drugs; PTSD) Persecutory voices/beliefs?: No Depression: Yes Depression Symptoms: Despondent, Insomnia, Tearfulness, Isolating, Fatigue, Guilt, Loss of interest in usual pleasures, Feeling worthless/self pity, Feeling angry/irritable Substance abuse history and/or treatment for substance abuse?: Yes Suicide prevention information given to non-admitted patients: Yes  Risk to Others within the past 6 months Homicidal Ideation: No Does patient have any lifetime risk of violence toward others beyond the six months prior to admission? : No Thoughts of Harm to Others: No Current Homicidal Intent: No Current Homicidal Plan: No Access to Homicidal Means: No Identified Victim: NA History of harm to others?: No Assessment of Violence: None Noted Violent Behavior Description: None Reported Does patient have access to weapons?: No Criminal Charges Pending?: No Does patient have a court date: No Is patient on probation?: No  Psychosis Hallucinations: None noted Delusions: None noted  Mental Status Report Appearance/Hygiene: Disheveled, Layered clothes Eye Contact: Poor Motor Activity: Freedom of movement Speech: Logical/coherent Level of Consciousness: Alert, Restless Mood: Depressed Affect: Depressed Anxiety Level: None Thought Processes: Relevant, Coherent Judgement: Impaired Orientation: Person, Place, Time, Situation Obsessive Compulsive Thoughts/Behaviors: None  Cognitive Functioning Concentration: Decreased Memory: Recent Intact, Remote Intact IQ: Average Insight: Fair Impulse Control: Poor Appetite: Poor Weight Loss: 0 Weight Gain: 0 Sleep: Decreased Total Hours of Sleep:  1 Vegetative Symptoms: None  ADLScreening [facility] Assessment Services) Patient's cognitive ability adequate to safely complete daily activities?: Yes Patient able to express need for assistance with ADLs?: Yes Independently performs ADLs?: Yes (appropriate for developmental age)  Prior Inpatient Therapy Prior Inpatient Therapy: Yes Prior Therapy Dates: 2017 - October  [facility]  Reason for Treatment: Overdose on sleeping pills   Prior Outpatient Therapy Prior Outpatient Therapy: Yes Prior Therapy Dates: Ongoing  [facility] [place] Reason for Treatment: Substance Abuse and Mental Health Does patient have an ACCT team?: No Does patient have Intensive In-House Services?  : No Does patient have [facility] services? : No Does patient have P4CC services?: No  ADL Screening (condition at time of admission) Patient's cognitive ability adequate to safely complete daily activities?: Yes Is the patient deaf or have difficulty hearing?: No Does the patient have difficulty seeing, even when wearing glasses/contacts?: No Does the patient have difficulty concentrating, remembering, or making decisions?: Yes Patient able to express need for assistance with ADLs?: Yes Does the patient have difficulty dressing or bathing?: No Independently performs ADLs?: Yes (appropriate for developmental age) Does the patient have difficulty walking or climbing stairs?: No Weakness of Legs: None Weakness of Arms/Hands: None  Home Assistive Devices/Equipment Home Assistive Devices/Equipment: None    Abuse/Neglect Assessment (Assessment to be complete while patient is alone) Physical Abuse: Denies Verbal Abuse: Denies Sexual Abuse: Denies Exploitation of patient/patient's resources: Denies Self-Neglect: Denies Values / Beliefs Cultural Requests During Hospitalization: None Spiritual  Requests During Hospitalization:  None Consults Spiritual Care Consult Needed: No Social Work Consult Needed: No Merchant navy officerAdvance Directives (For Healthcare) Does Patient Have a Medical Advance Directive?: No Would patient like information on creating a medical advance directive?: No - Patient declined    Additional Information 1:1 In Past 12 Months?: No CIRT Risk: No Elopement Risk: No Does patient have medical clearance?: Yes     Disposition: Per Vernona RiegerLaura, NP - patient meets criteria for inpatient hospitalization.  Disposition Initial Assessment Completed for this Encounter: Yes Disposition of Patient: Inpatient treatment program (Per Fransisca KaufmannLaura Davis, NP - pt meets inpt criteria.)  On Site Evaluation by:   Reviewed with Physician:    Phillip HealStevenson, Lawarence Meek [place] 10/02/2016 12:42 PM

## 2016-10-02 NOTE — ED Triage Notes (Signed)
Pt c/o suicidal ideation with plan to drive off of road or bridge. Some visual hallucinations of cars that are not there. No HI, no auditory hallucinations. Recent hx of suicide attempt in October, was treated psychiatrically at that time.

## 2016-10-03 DIAGNOSIS — F332 Major depressive disorder, recurrent severe without psychotic features: Principal | ICD-10-CM

## 2016-10-03 DIAGNOSIS — Z91011 Allergy to milk products: Secondary | ICD-10-CM

## 2016-10-03 DIAGNOSIS — Z79899 Other long term (current) drug therapy: Secondary | ICD-10-CM

## 2016-10-03 DIAGNOSIS — F1420 Cocaine dependence, uncomplicated: Secondary | ICD-10-CM

## 2016-10-03 DIAGNOSIS — F1010 Alcohol abuse, uncomplicated: Secondary | ICD-10-CM

## 2016-10-03 MED ORDER — LISINOPRIL 10 MG PO TABS
10.0000 mg | ORAL_TABLET | Freq: Every day | ORAL | Status: DC
  Administered 2016-10-03 – 2016-10-05 (×3): 10 mg via ORAL
  Filled 2016-10-03 (×2): qty 1
  Filled 2016-10-03: qty 2
  Filled 2016-10-03 (×2): qty 1

## 2016-10-03 MED ORDER — GUAIFENESIN 100 MG/5ML PO SOLN
5.0000 mL | ORAL | Status: DC | PRN
  Administered 2016-10-03 – 2016-10-04 (×6): 100 mg via ORAL
  Filled 2016-10-03 (×6): qty 5

## 2016-10-03 MED ORDER — PANTOPRAZOLE SODIUM 40 MG PO TBEC
40.0000 mg | DELAYED_RELEASE_TABLET | Freq: Every day | ORAL | Status: DC
  Administered 2016-10-03 – 2016-10-05 (×3): 40 mg via ORAL
  Filled 2016-10-03 (×5): qty 1

## 2016-10-03 MED ORDER — BUPROPION HCL ER (XL) 300 MG PO TB24
300.0000 mg | ORAL_TABLET | Freq: Every day | ORAL | Status: DC
  Administered 2016-10-03 – 2016-10-05 (×3): 300 mg via ORAL
  Filled 2016-10-03 (×5): qty 1

## 2016-10-03 NOTE — [facility] Suicide Risk Assessment (Signed)
[facility] INPATIENT:  Family/Significant Other Suicide Prevention Education  Suicide Prevention Education:  Patient Refusal for Family/Significant Other Suicide Prevention Education: The patient Alexander Munoz has refused to provide written consent for family/significant other to be provided Family/Significant Other Suicide Prevention Education during admission and/or prior to discharge.  Physician notified.  SPE completed with pt, as pt refused to consent to family contact. SPI pamphlet provided to pt and pt was encouraged to share information with support network, ask questions, and talk about any concerns relating to SPE. Pt denies access to guns/firearms and verbalized understanding of information provided. Mobile Crisis information also provided to pt.   Alexander Munoz N Smart LCSW 10/03/2016, 3:09 PM

## 2016-10-03 NOTE — Plan of Care (Signed)
Problem: Coping: Goal: Ability to demonstrate self-control will improve Outcome: Progressing Pt calm and cooperative .

## 2016-10-03 NOTE — [facility] Group Notes (Signed)
[facility] LCSW Group Therapy  10/03/2016 2:28 PM  Type of Therapy:  Group Therapy  Participation Level:  Active  Participation Quality:  Attentive  Affect:  Appropriate   Cognitive:  Alert  Insight:  Improving  Engagement in Therapy:  Engaged  Modes of Intervention:  Confrontation, Discussion, Education, Problem-solving, Rapport Building, Socialization and Support  Summary of Progress/Problems: [name] came to talk about his personal journey with substance abuse and addiction. The pt processed ways by which to relate to the speaker. [name] provided handouts and educational information pertaining to groups and services offered by the [facility].   Alexander Munoz N Smart LCSW 10/03/2016, 2:28 PM

## 2016-10-03 NOTE — Tx Team (Signed)
Interdisciplinary Treatment and Diagnostic Plan Update  10/03/2016 Time of Session: 9:30AM Tri Chittick MRN: [id number]  Principal Diagnosis: MDD (major depressive disorder), recurrent severe, without psychosis (HCC)  Secondary Diagnoses: Principal Problem:   MDD (major depressive disorder), recurrent severe, without psychosis (HCC) Active Problems:   Cocaine dependence (HCC)   Current Medications:  Current Facility-Administered Medications  Medication Dose Route Frequency Provider Last Rate Last Dose  . acetaminophen (TYLENOL) tablet 650 mg  650 mg Oral Q6H PRN Kristeen Mans, NP      . alum & mag hydroxide-simeth (MAALOX/MYLANTA) 200-200-20 MG/5ML suspension 30 mL  30 mL Oral Q4H PRN Kristeen Mans, NP      . buPROPion (WELLBUTRIN XL) 24 hr tablet 300 mg  300 mg Oral Daily Acquanetta Sit, MD   300 mg at 10/03/16 1025  . guaiFENesin (ROBITUSSIN) 100 MG/5ML solution 100 mg  5 mL Oral Q4H PRN Acquanetta Sit, MD   100 mg at 10/03/16 1024  . lisinopril (PRINIVIL,ZESTRIL) tablet 10 mg  10 mg Oral Daily Acquanetta Sit, MD   10 mg at 10/03/16 1025  . magnesium hydroxide (MILK OF MAGNESIA) suspension 30 mL  30 mL Oral Daily PRN Kristeen Mans, NP      . nicotine (NICODERM CQ - dosed in mg/24 hours) patch 21 mg  21 mg Transdermal Daily Kerry Hough, PA-C   21 mg at 10/03/16 0819  . pantoprazole (PROTONIX) EC tablet 40 mg  40 mg Oral Daily Acquanetta Sit, MD   40 mg at 10/03/16 1025  . traZODone (DESYREL) tablet 50 mg  50 mg Oral QHS,MR X 1 Kerry Hough, PA-C   50 mg at 10/02/16 2211   PTA Medications: Prescriptions Prior to Admission  Medication Sig Dispense Refill Last Dose  . buPROPion (WELLBUTRIN XL) 300 MG 24 hr tablet Take 300 mg by mouth daily.   Past Week at Unknown time  . hydrOXYzine (ATARAX/VISTARIL) 25 MG tablet Take 1 tablet (25 mg total) by mouth 3 (three) times daily as needed for anxiety. (Patient taking differently: Take 50 mg by mouth 2 (two)  times daily as needed for anxiety. ) 15 tablet 0 Past Week at Unknown time  . lisinopril-hydrochlorothiazide (PRINZIDE,ZESTORETIC) 10-12.5 MG per tablet Take 1 tablet by mouth daily. (Patient not taking: Reported on 10/02/2016)   Not Taking at Unknown time  . lisinopril-hydrochlorothiazide (PRINZIDE,ZESTORETIC) 20-12.5 MG tablet Take 1 tablet by mouth daily.   Past Week at Unknown time  . Melatonin 3 MG TABS Take 6 mg by mouth at bedtime as needed (sleep).   Past Week at Unknown time  . nicotine (NICODERM CQ - DOSED IN MG/24 HOURS) 21 mg/24hr patch Place 21 mg onto the skin daily.   Past Month at Unknown time  . pantoprazole (PROTONIX) 40 MG tablet Take 1 tablet (40 mg total) by mouth daily. 30 tablet 0 10/01/2016 at Unknown time  . sildenafil (VIAGRA) 100 MG tablet Take 300 mg by mouth daily as needed for erectile dysfunction.   Past Month at Unknown time  . traZODone (DESYREL) 50 MG tablet Take 1 tablet (50 mg total) by mouth at bedtime as needed for sleep. (Patient not taking: Reported on 10/02/2016) 30 tablet 0 Not Taking at Unknown time  . verapamil (CALAN-SR) 240 MG CR tablet Take 1 tablet (240 mg total) by mouth at bedtime. (Patient not taking: Reported on 10/02/2016) 30 tablet 0 Not Taking at Unknown time    Patient Stressors: Loss of girlfriend,  3 years ago Substance abuse Other: insomnia  Patient Strengths: Ability for insight Capable of independent living Communication skills General fund of knowledge  Treatment Modalities: Medication Management, Group therapy, Case management,  1 to 1 session with clinician, Psychoeducation, Recreational therapy.   Physician Treatment Plan for Primary Diagnosis: MDD (major depressive disorder), recurrent severe, without psychosis (HCC) Long Term Goal(s): Improvement in symptoms so as ready for discharge Improvement in symptoms so as ready for discharge   Short Term Goals: Ability to disclose and discuss suicidal ideas Ability to demonstrate  self-control will improve Ability to identify changes in lifestyle to reduce recurrence of condition will improve Ability to identify triggers associated with substance abuse/mental health issues will improve  Medication Management: Evaluate patient's response, side effects, and tolerance of medication regimen.  Therapeutic Interventions: 1 to 1 sessions, Unit Group sessions and Medication administration.  Evaluation of Outcomes: Progressing  Physician Treatment Plan for Secondary Diagnosis: Principal Problem:   MDD (major depressive disorder), recurrent severe, without psychosis (HCC) Active Problems:   Cocaine dependence (HCC)  Long Term Goal(s): Improvement in symptoms so as ready for discharge Improvement in symptoms so as ready for discharge   Short Term Goals: Ability to disclose and discuss suicidal ideas Ability to demonstrate self-control will improve Ability to identify changes in lifestyle to reduce recurrence of condition will improve Ability to identify triggers associated with substance abuse/mental health issues will improve     Medication Management: Evaluate patient's response, side effects, and tolerance of medication regimen.  Therapeutic Interventions: 1 to 1 sessions, Unit Group sessions and Medication administration.  Evaluation of Outcomes: Progressing   RN Treatment Plan for Primary Diagnosis: MDD (major depressive disorder), recurrent severe, without psychosis (HCC) Long Term Goal(s): Knowledge of disease and therapeutic regimen to maintain health will improve  Short Term Goals: Ability to remain free from injury will improve, Ability to disclose and discuss suicidal ideas and Ability to identify and develop effective coping behaviors will improve  Medication Management: RN will administer medications as ordered by provider, will assess and evaluate patient's response and provide education to patient for prescribed medication. RN will report any adverse  and/or side effects to prescribing provider.  Therapeutic Interventions: 1 on 1 counseling sessions, Psychoeducation, Medication administration, Evaluate responses to treatment, Monitor vital signs and CBGs as ordered, Perform/monitor CIWA, COWS, AIMS and Fall Risk screenings as ordered, Perform wound care treatments as ordered.  Evaluation of Outcomes: Progressing   LCSW Treatment Plan for Primary Diagnosis: MDD (major depressive disorder), recurrent severe, without psychosis (HCC) Long Term Goal(s): Safe transition to appropriate next level of care at discharge, Engage patient in therapeutic group addressing interpersonal concerns.  Short Term Goals: Engage patient in aftercare planning with referrals and resources, Facilitate patient progression through stages of change regarding substance use diagnoses and concerns and Identify triggers associated with mental health/substance abuse issues  Therapeutic Interventions: Assess for all discharge needs, 1 to 1 time with Social worker, Explore available resources and support systems, Assess for adequacy in community support network, Educate family and significant other(s) on suicide prevention, Complete Psychosocial Assessment, Interpersonal group therapy.  Evaluation of Outcomes: Progressing   Progress in Treatment: Attending groups: No. Participating in groups: No. New to unit. Continuing to assess.  Taking medication as prescribed: Yes. Toleration medication: Yes. Family/Significant other contact made: No, will contact:  family member if patient consents Patient understands diagnosis: Yes. Discussing patient identified problems/goals with staff: Yes. Medical problems stabilized or resolved: Yes. Denies suicidal/homicidal ideation: No. Passive  SI/Able to contract for safety on the unit.  Issues/concerns per patient self-inventory: No. Other: n/a  New problem(s) identified: No, Describe:  n/a  New Short Term/Long Term Goal(s):  medication stabilization; detox; development of comprehensive mental wellness/sobriety plan.   Discharge Plan or Barriers: CSW assessing for appropriate referrals. [facility].   Reason for Continuation of Hospitalization: Depression Medication stabilization Suicidal ideation Withdrawal symptoms  Estimated Length of Stay: 3-5 days   Attendees: Patient: 10/03/2016 3:09 PM  Physician:  Dr. Mckinley Jewelates MD 10/03/2016 3:09 PM  Nursing: Mee HivesJanet, Beverly RN 10/03/2016 3:09 PM  RN Care Manager: Onnie BoerJennifer Clark CM 10/03/2016 3:09 PM  Social Worker: Trula SladeHeather Smart, LCSW; Blue EyeKristin Drinkard LCSW 10/03/2016 3:09 PM  Recreational Therapist:  10/03/2016 3:09 PM  Other: May Augustin NP 10/03/2016 3:09 PM  Other:  10/03/2016 3:09 PM  Other: 10/03/2016 3:09 PM    Scribe for Treatment Team: Ledell PeoplesHeather N Smart, LCSW 10/03/2016 3:09 PM

## 2016-10-03 NOTE — Progress Notes (Signed)
Pt presents with sad, flat affect but brightens on approach. Pt reports slept well on last night with medications. Reports appetite good and energy level low. Pt noted in bed most of morning. Rates depression and hopelessness at 7 and anxiety at 6. Pt reports having cough and requested medication. Denies SI, HI, AVH. Endorses depression Encouragement and support offered. Medications given as prescribed. PRN given for cough effective.  Safety checks maintained. Patient receptive and remains safe on unit with q 15 min checks.
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
## 2016-10-03 NOTE — BHH Group Notes (Signed)
The focus of this group is to educate the patient on the purpose and policies of crisis stabilization and provide a format to answer questions about their admission.  The group details unit policies and expectations of patients while admitted.  Patient did not attend 0900 nurse education orientation group this morning.  Patient stayed in room.  

## 2016-10-03 NOTE — Progress Notes (Signed)
Patient ID: Alexander BailiffJohn Brenes, male   DOB: 30-May-1961, 55 y.o.   MRN: [id number]  Pt currently presents with a flat affect and guarded, cooperative behavior. Pt reports to writer that their goal is to "try to go home tomorrow." Pt states "I feel better today since I slept last night, that medicine worked." Pt reports good sleep with current medication regimen. Pt remains isolative, limited interaction with other patients and staff.   Pt provided with medications per providers orders. Pt's labs and vitals were monitored throughout the night. Pt supported emotionally and encouraged to express concerns and questions. Pt educated on medications and side effects.   Pt's safety ensured with 15 minute and environmental checks. Pt currently denies SI/HI and A/V hallucinations. Pt verbally agrees to seek staff if SI/HI or A/VH occurs and to consult with staff before acting on any harmful thoughts. Will continue POC.

## 2016-10-03 NOTE — Progress Notes (Signed)
Recreation Therapy Notes   Animal-Assisted Activity (AAA) Program Checklist/Progress Notes Patient Eligibility Criteria Checklist & Daily Group note for Rec TxIntervention  Date: 12.05.2017 Time: 2:45pm Location: [address] Dayroom    AAA/T Program Assumption of Risk Form signed by Patient/ or Parent Legal Guardian Yes  Patient is free of allergies or sever asthma Yes  Patient reports no fear of animals Yes  Patient reports no history of cruelty to animals Yes  Patient understands his/her participation is voluntary Yes  Behavioral Response: Did not attend.   Nai Dasch L Michiel Sivley, LRT/CTRS        [employer] 10/03/2016 3:01 PM 

## 2016-10-03 NOTE — H&P (Addendum)
Psychiatric Admission Assessment Adult  Patient Identification: Alexander Munoz MRN:  [id number] Date of Evaluation:  10/03/2016 Chief Complaint:  MDD SEVERE  Principal Diagnosis: MDD (major depressive disorder), recurrent severe, without psychosis ([place]) Diagnosis:   Patient Active Problem List   Diagnosis Date Noted  . MDD (major depressive disorder), recurrent severe, without psychosis ([place]) [F33.2] 09/07/2015  . Major depressive disorder, recurrent, severe without psychotic features ([place]) [F33.2]   . Cocaine dependence ([place]) [F14.20] 08/31/2014  . Alcohol abuse [F10.10] 08/31/2014   History of Present Illness: Patient who usually follows up with the [facility] in [place] presents with complaints of suicidal ideation. Reports stressors of relapsing after 40 clean days and the anniversary of the death of a girlfriend from cancer 3 years ago. He reports that he took an overdose of his sleeping medication and was hospitalized in [place] in October 2017. The patient states that for him depression is a feeling of lack of energy, being sad having some loss of interest in activities at sleeping more which lasts about a week or so. He states he feels he first noted depression in his late 24s and first sought treatment for it around age 56 and [facility]  A she denies any current suicidal or homicidal ideation, plan or intent. He denies any psychotic symptoms.  Patient reports he has a substance use disorder for crack cocaine and endorses craving, spending time and money on obtaining, giving up things per use, and used despite consequences. He states he started using crack at age 55 and it was immediately a problem. He reports that he has been to 10+ rehabilitation programs and "they all helped." He reports the longest period of sobriety he has had is about 5 months and he states that would help him with this was "staying busy." Patient denies regular use  of any other substances, and states that his use of tobacco varies greatly and prior to relapse he was only smoking about 4 cigarettes a week and had started to work out again.   He reports he has been on Wellbutrin 300 mg a day with fairly good result and has been compliant up to a day or two ago. He also reports taking Atarax and trazodone as an outpatient.   He states he is on Zestoric  but he does no longer take verapamil for high blood pressure.  Patient reports he does have a current girlfriend and he lives by himself in his own residence. He has 2 children a son age 37 and a daughter age 10. He reports that he has not seen his son in 49 years but he called him last Christmas and they speak regularly now and that he was in contact with him throughout his childhood. He also reports a good relationship with his daughter who is attending [employer] state.  Patient is 100% disabled with the [employer] and reports that he does not work. He does volunteer at the [place]. 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 He reports that he served in the 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 Associated Signs/Symptoms: Depression Symptoms:  depressed mood, anhedonia, suicidal thoughts without plan, loss of energy/fatigue, disturbed sleep, (Hypo) Manic Symptoms:  none Anxiety Symptoms:  none Psychotic Symptoms:  none PTSD Symptoms: Negative Total Time spent with patient:  30 minutes  Past Psychiatric History: Patient reports he has had about 4 hospitalizations for psychiatric reasons since the 1990s and has consistently followed up with the [facility] for about the past 25 years. He feels his current psychiatric medications are helpful. He reports that he has been to about 10+ rehabs for crack cocaine use disorder.  Is the patient at risk to self? No.  Has the patient been a risk to self in  the past 6 months? Yes.    Has the patient been a risk to self within the distant past? Yes.    Is the patient a risk to others? No.  Has the patient been a risk to others in the past 6 months? No.  Has the patient been a risk to others within the distant past? No.   Prior Inpatient Therapy: Prior Inpatient Therapy: Yes Prior Therapy Dates: 2017 - October  Prior Therapy Facilty/Provider(s[facility]  Reason for Treatment: Overdose on sleeping pills  Prior Outpatient Therapy: Prior Outpatient Therapy: Yes Prior Therapy Dates: Ongoing  [facility] [place] Reason for Treatment: Substance Abuse and Mental Health Does patient have an ACCT team?: No Does patient have Intensive In-House Services?  : No Does patient have [facility] services? : No Does patient have P4CC services?: No  Alcohol Screening: 1. How often do you have a drink containing alcohol?: Never 9. Have you or someone else been injured as a result of your drinking?: No 10. Has a relative or friend or a doctor or another health worker been concerned about your drinking or suggested you cut down?: No Alcohol Use Disorder Identification Test Final Score (AUDIT): 0 Brief Intervention: AUDIT score less than 7 or less-screening does not suggest unhealthy drinking-brief intervention not indicated Substance Abuse History in the last 12 months:  Yes.   Consequences of Substance Abuse: Medical Consequences:  Becoming noncompliant with medications and having to be hospitalized Previous Psychotropic Medications: Yes  Psychological Evaluations: Yes  Past Medical History:  Past Medical History:  Diagnosis Date  . Anxiety   . Depression   . GERD (gastroesophageal reflux disease)   . Hypertension   . Lactose intolerance   . Migraines    History reviewed. No pertinent surgical history. Family History:  Family History  Problem Relation Age of Onset  . Cancer Other   . Hypertension  Other   . Heart attack Other    Family Psychiatric  History: none known Tobacco Screening: Have you used any form of tobacco in the last 30 days? (Cigarettes, Smokeless Tobacco, Cigars, and/or Pipes): Yes Tobacco use, Select all that apply: 5 or more cigarettes per day Are you interested in Tobacco Cessation Medications?: Yes, will notify MD for an order Counseled patient on smoking cessation including recognizing danger situations, developing coping skills and basic information about quitting provided: Refused/Declined practical counseling Social History:  History  Alcohol Use  . Yes    Comment: Alcohol 3x's a week      History  Drug Use  . Types: Cocaine    Comment: Daily cocaine use     Additional Social History: Marital status: Separated    Prescriptions: see PTA med list History of alcohol / drug use?: Yes Longest period of sobriety (when/how long): Only during psych admission  Negative Consequences of Use: Work / Youth worker, Charity fundraiser relationships, Museum/gallery curator Withdrawal Symptoms:  (sedation) Name of Substance 1: "crack cocaine" 1 - Amount (size/oz): 1 gram 1 - Duration: "past 4 days" 1 - Last  Use / Amount: "yesterday"                  Allergies:   Allergies  Allergen Reactions  . Lactose Intolerance (Gi) Other (See Comments)    Gi upset    Lab Results:  Results for orders placed or performed during the hospital encounter of 10/02/16 (from the past 48 hour(s))  Comprehensive metabolic panel     Status: Abnormal   Collection Time: 10/02/16  1:18 PM  Result Value Ref Range   Sodium 143 135 - 145 mmol/L   Potassium 4.0 3.5 - 5.1 mmol/L   Chloride 110 101 - 111 mmol/L   CO2 25 22 - 32 mmol/L   Glucose, Bld 89 65 - 99 mg/dL   BUN 15 6 - 20 mg/dL   Creatinine, Ser 1.30 (H) 0.61 - 1.24 mg/dL   Calcium 8.7 (L) 8.9 - 10.3 mg/dL   Total Protein 7.2 6.5 - 8.1 g/dL   Albumin 4.3 3.5 - 5.0 g/dL   AST 29 15 - 41 U/L   ALT 23 17 - 63 U/L   Alkaline Phosphatase 66 38 -  126 U/L   Total Bilirubin 0.4 0.3 - 1.2 mg/dL   GFR calc non Af Amer 60 (L) >60 mL/min   GFR calc Af Amer >60 >60 mL/min    Comment: (NOTE) The eGFR has been calculated using the CKD EPI equation. This calculation has not been validated in all clinical situations. eGFR's persistently <60 mL/min signify possible Chronic Kidney Disease.    Anion gap 8 5 - 15  Ethanol     Status: None   Collection Time: 10/02/16  1:18 PM  Result Value Ref Range   Alcohol, Ethyl (B) <5 <5 mg/dL    Comment:        LOWEST DETECTABLE LIMIT FOR SERUM ALCOHOL IS 5 mg/dL FOR MEDICAL PURPOSES ONLY   Salicylate level     Status: None   Collection Time: 10/02/16  1:18 PM  Result Value Ref Range   Salicylate Lvl <4.6 2.8 - 30.0 mg/dL  Acetaminophen level     Status: Abnormal   Collection Time: 10/02/16  1:18 PM  Result Value Ref Range   Acetaminophen (Tylenol), Serum <10 (L) 10 - 30 ug/mL    Comment:        THERAPEUTIC CONCENTRATIONS VARY SIGNIFICANTLY. A RANGE OF 10-30 ug/mL MAY BE AN EFFECTIVE CONCENTRATION FOR MANY PATIENTS. HOWEVER, SOME ARE BEST TREATED AT CONCENTRATIONS OUTSIDE THIS RANGE. ACETAMINOPHEN CONCENTRATIONS >150 ug/mL AT 4 HOURS AFTER INGESTION AND >50 ug/mL AT 12 HOURS AFTER INGESTION ARE OFTEN ASSOCIATED WITH TOXIC REACTIONS.   cbc     Status: Abnormal   Collection Time: 10/02/16  1:18 PM  Result Value Ref Range   WBC 8.2 4.0 - 10.5 K/uL   RBC 4.85 4.22 - 5.81 MIL/uL   Hemoglobin 12.8 (L) 13.0 - 17.0 g/dL   HCT 39.0 39.0 - 52.0 %   MCV 80.4 78.0 - 100.0 fL   MCH 26.4 26.0 - 34.0 pg   MCHC 32.8 30.0 - 36.0 g/dL   RDW 15.3 11.5 - 15.5 %   Platelets 399 150 - 400 K/uL  Rapid urine drug screen (hospital performed)     Status: Abnormal   Collection Time: 10/02/16  2:25 PM  Result Value Ref Range   Opiates NONE DETECTED NONE DETECTED   Cocaine POSITIVE (A) NONE DETECTED   Benzodiazepines NONE DETECTED NONE DETECTED   Amphetamines NONE DETECTED NONE DETECTED  Tetrahydrocannabinol NONE DETECTED NONE DETECTED   Barbiturates NONE DETECTED NONE DETECTED    Comment:        DRUG SCREEN FOR MEDICAL PURPOSES ONLY.  IF CONFIRMATION IS NEEDED FOR ANY PURPOSE, NOTIFY LAB WITHIN 5 DAYS.        LOWEST DETECTABLE LIMITS FOR URINE DRUG SCREEN Drug Class       Cutoff (ng/mL) Amphetamine      1000 Barbiturate      200 Benzodiazepine   850 Tricyclics       277 Opiates          300 Cocaine          300 THC              50     Blood Alcohol level:  Lab Results  Component Value Date   ETH <5 10/02/2016   ETH <5 41/28/7867    Metabolic Disorder Labs:  No results found for: HGBA1C, MPG No results found for: PROLACTIN No results found for: CHOL, TRIG, HDL, CHOLHDL, VLDL, LDLCALC  Current Medications: Current Facility-Administered Medications  Medication Dose Route Frequency Provider Last Rate Last Dose  . acetaminophen (TYLENOL) tablet 650 mg  650 mg Oral Q6H PRN Lurena Nida, NP      . alum & mag hydroxide-simeth (MAALOX/MYLANTA) 200-200-20 MG/5ML suspension 30 mL  30 mL Oral Q4H PRN Lurena Nida, NP      . buPROPion (WELLBUTRIN XL) 24 hr tablet 300 mg  300 mg Oral Daily Linard Millers, MD   300 mg at 10/03/16 1025  . guaiFENesin (ROBITUSSIN) 100 MG/5ML solution 100 mg  5 mL Oral Q4H PRN Linard Millers, MD   100 mg at 10/03/16 1024  . lisinopril (PRINIVIL,ZESTRIL) tablet 10 mg  10 mg Oral Daily Linard Millers, MD   10 mg at 10/03/16 1025  . magnesium hydroxide (MILK OF MAGNESIA) suspension 30 mL  30 mL Oral Daily PRN Lurena Nida, NP      . nicotine (NICODERM CQ - dosed in mg/24 hours) patch 21 mg  21 mg Transdermal Daily Laverle Hobby, PA-C   21 mg at 10/03/16 0819  . pantoprazole (PROTONIX) EC tablet 40 mg  40 mg Oral Daily Linard Millers, MD   40 mg at 10/03/16 1025  . traZODone (DESYREL) tablet 50 mg  50 mg Oral QHS,MR X 1 Laverle Hobby, PA-C   50 mg at 10/02/16 2211   PTA Medications: Prescriptions Prior to  Admission  Medication Sig Dispense Refill Last Dose  . buPROPion (WELLBUTRIN XL) 300 MG 24 hr tablet Take 300 mg by mouth daily.   Past Week at Unknown time  . hydrOXYzine (ATARAX/VISTARIL) 25 MG tablet Take 1 tablet (25 mg total) by mouth 3 (three) times daily as needed for anxiety. (Patient taking differently: Take 50 mg by mouth 2 (two) times daily as needed for anxiety. ) 15 tablet 0 Past Week at Unknown time  . lisinopril-hydrochlorothiazide (PRINZIDE,ZESTORETIC) 10-12.5 MG per tablet Take 1 tablet by mouth daily. (Patient not taking: Reported on 10/02/2016)   Not Taking at Unknown time  . lisinopril-hydrochlorothiazide (PRINZIDE,ZESTORETIC) 20-12.5 MG tablet Take 1 tablet by mouth daily.   Past Week at Unknown time  . Melatonin 3 MG TABS Take 6 mg by mouth at bedtime as needed (sleep).   Past Week at Unknown time  . nicotine (NICODERM CQ - DOSED IN MG/24 HOURS) 21 mg/24hr patch Place 21 mg onto the skin daily.  Past Month at Unknown time  . pantoprazole (PROTONIX) 40 MG tablet Take 1 tablet (40 mg total) by mouth daily. 30 tablet 0 10/01/2016 at Unknown time  . sildenafil (VIAGRA) 100 MG tablet Take 300 mg by mouth daily as needed for erectile dysfunction.   Past Month at Unknown time  . traZODone (DESYREL) 50 MG tablet Take 1 tablet (50 mg total) by mouth at bedtime as needed for sleep. (Patient not taking: Reported on 10/02/2016) 30 tablet 0 Not Taking at Unknown time  . verapamil (CALAN-SR) 240 MG CR tablet Take 1 tablet (240 mg total) by mouth at bedtime. (Patient not taking: Reported on 10/02/2016) 30 tablet 0 Not Taking at Unknown time    Musculoskeletal: Strength & Muscle Tone: within normal limits Gait & Station: normal Patient leans: N/A  Psychiatric Specialty Exam: Physical Exam  Constitutional: He appears well-developed and well-nourished.  HENT:  Head: Normocephalic and atraumatic.  Right Ear: External ear normal.  Left Ear: External ear normal.  Nose: Nose normal.  Eyes:  Conjunctivae and EOM are normal. Pupils are equal, round, and reactive to light.  Neck: Normal range of motion.  Respiratory: Effort normal.  Musculoskeletal: Normal range of motion.  Neurological: He is alert.  Psychiatric: His behavior is normal.    ROS reports intermittent heartburn and GERD symptoms   Blood pressure 137/88, pulse 92, temperature 98.1 F (36.7 C), temperature source Oral, resp. rate 16, height $RemoveBe'5\' 8"'MwDuczRmp$  (1.727 m), weight 113.4 kg (250 lb).Body mass index is 38.01 kg/m.  General Appearance: Casual  Eye Contact:  Good  Speech:  Clear and Coherent  Volume:  Normal  Mood:  Dysphoric  Affect:  Congruent  Thought Process:  Coherent  Orientation:  Negative  Thought Content:  Negative  Suicidal Thoughts:  No  Homicidal Thoughts:  No  Memory:  Negative  Judgement:  Fair  Insight:  Fair  Psychomotor Activity:  Normal  Concentration:  Concentration: Good  Recall:  Good  Fund of Knowledge:  Good  Language:  Good  Akathisia:  No  Handed:  Right  AIMS (if indicated):     Assets:  Resilience  ADL's:  Intact  Cognition:  WNL  Sleep:  Number of Hours: 6    Treatment Plan Summary: Daily contact with patient to assess and evaluate symptoms and progress in treatment, Medication management and Will resume Wellbutrin 300 mg by mouth every morning will offer trazodone and Vistaril when necessary. Patient's creatinine in the emergency room was mildly elevated at 1.3 so we will resume only lisinopril at 10 mg by mouth every morning for now and recheck creatinine tomorrow morning. Patient had borderline long corrected QT interval at 486 ms Will recheck EKG tomorrow as well. Patient does not appear to have any problems with his current medications in terms of any affect from this. We'll monitor the patient's response and once stable the plan is for patient to return to his regular follow-up with groups and the [place] clinic.  Observation Level/Precautions:  15 minute checks  Laboratory:   see labs  Psychotherapy:    Medications:    Consultations:    Discharge Concerns:    Estimated LOS:  Other:     Physician Treatment Plan for Primary Diagnosis: MDD (major depressive disorder), recurrent severe, without psychosis ([place]) Long Term Goal(s): Improvement in symptoms so as ready for discharge  Short Term Goals: Ability to disclose and discuss suicidal ideas and Ability to demonstrate self-control will improve  Physician Treatment Plan for Secondary Diagnosis:  Principal Problem:   MDD (major depressive disorder), recurrent severe, without psychosis ([place]) Active Problems:   Cocaine dependence ([place])  Long Term Goal(s): Improvement in symptoms so as ready for discharge  Short Term Goals: Ability to identify changes in lifestyle to reduce recurrence of condition will improve and Ability to identify triggers associated with substance abuse/mental health issues will improve  I certify that inpatient services furnished can reasonably be expected to improve the patient's condition.    Linard Millers, MD 12/5/201712:15 PM

## 2016-10-03 NOTE — [facility] Suicide Risk Assessment (Signed)
[facility]   Nursing information obtained from:    Demographic factors:    Current Mental Status:    Loss Factors:    Historical Factors:    Risk Reduction Factors:     Total Time spent with patient: 30 minutes Principal Problem: MDD (major depressive disorder), recurrent severe, without psychosis (HCC) Diagnosis:   Patient Active Problem List   Diagnosis Date Noted  . MDD (major depressive disorder), recurrent severe, without psychosis (HCC) [F33.2] 09/07/2015  . Major depressive disorder, recurrent, severe without psychotic features (HCC) [F33.2]   . Cocaine dependence (HCC) [F14.20] 08/31/2014  . Alcohol abuse [F10.10] 08/31/2014   Subjective Data: Patient denies current suicidal or homicidal ideation, plan or intent.  Continued Clinical Symptoms:  Alcohol Use Disorder Identification Test Final Score (AUDIT): 0 The "Alcohol Use Disorders Identification Test", Guidelines for Use in Primary Care, Second Edition.  World Science writerHealth Organization [facility](WHO). Score between 0-7:  no or low risk or alcohol related problems. Score between 8-15:  moderate risk of alcohol related problems. Score between 16-19:  high risk of alcohol related problems. Score 20 or above:  warrants further diagnostic evaluation for alcohol dependence and treatment.   CLINICAL FACTORS:   Alcohol/Substance Abuse/Dependencies   Musculoskeletal: Strength & Muscle Tone: within normal limits Gait & Station: normal Patient leans: N/A  Psychiatric Specialty Exam: Physical Exam  ROS  Blood pressure 137/88, pulse 92, temperature 98.1 F (36.7 C), temperature source Oral, resp. rate 16, height 5\' 8"  (1.727 m), weight 113.4 kg (250 lb).Body mass index is 38.01 kg/m.   General Appearance: Casual  Eye Contact:  Good  Speech:  Clear and Coherent  Volume:  Normal  Mood:  Dysphoric  Affect:  Congruent  Thought Process:  Coherent  Orientation:  Negative  Thought Content:  Negative  Suicidal  Thoughts:  No  Homicidal Thoughts:  No  Memory:  Negative  Judgement:  Fair  Insight:  Fair  Psychomotor Activity:  Normal  Concentration:  Concentration: Good  Recall:  Good  Fund of Knowledge:  Good  Language:  Good  Akathisia:  No  Handed:  Right  AIMS (if indicated):     Assets:  Resilience  ADL's:  Intact  Cognition:  WNL  Sleep:  Number of Hours: 6      COGNITIVE FEATURES THAT CONTRIBUTE TO RISK:  None    SUICIDE RISK:   Mild:  Suicidal ideation of limited frequency, intensity, duration, and specificity.  There are no identifiable plans, no associated intent, mild dysphoria and related symptoms, good self-control (both objective and subjective assessment), few other risk factors, and identifiable protective factors, including available and accessible social support.   PLAN OF CARE: see paa  I certify that inpatient services furnished can reasonably be expected to improve the patient's condition.  Acquanetta SitElizabeth Woods Oates, MD 10/03/2016, 12:29 PM

## 2016-10-04 LAB — BASIC METABOLIC PANEL
Anion gap: 6 (ref 5–15)
BUN: 14 mg/dL (ref 6–20)
CHLORIDE: 108 mmol/L (ref 101–111)
CO2: 27 mmol/L (ref 22–32)
CREATININE: 1.26 mg/dL — AB (ref 0.61–1.24)
Calcium: 8.6 mg/dL — ABNORMAL LOW (ref 8.9–10.3)
GFR calc Af Amer: >60 mL/min (ref >60)
GFR calc non Af Amer: >60 mL/min (ref >60)
Glucose, Bld: 104 mg/dL — ABNORMAL HIGH (ref 65–99)
Potassium: 3.7 mmol/L (ref 3.5–5.1)
SODIUM: 141 mmol/L (ref 135–145)

## 2016-10-04 LAB — TSH: TSH: 1.862 u[IU]/mL (ref 0.350–4.500)

## 2016-10-04 LAB — VITAMIN B12: Vitamin B-12: 353 pg/mL (ref 180–914)

## 2016-10-04 NOTE — [facility] Suicide Risk Assessment (Signed)
[facility] Discharge Suicide Risk Assessment   Principal Problem: MDD (major depressive disorder), recurrent severe, without psychosis (HCC) Discharge Diagnoses:  Patient Active Problem List   Diagnosis Date Noted  . MDD (major depressive disorder), recurrent severe, without psychosis (HCC) [F33.2] 09/07/2015  . Major depressive disorder, recurrent, severe without psychotic features (HCC) [F33.2]   . Cocaine dependence (HCC) [F14.20] 08/31/2014  . Alcohol abuse [F10.10] 08/31/2014    Total Time spent with patient: 15 minutes  Musculoskeletal: Strength & Muscle Tone: within normal limits Gait & Station: normal Patient leans: N/A  Psychiatric Specialty Exam: ROS  Blood pressure (!) 137/93, pulse 71, temperature 97.6 F (36.4 C), temperature source Oral, resp. rate 18, height 5\' 8"  (1.727 m), weight 113.4 kg (250 lb).Body mass index is 38.01 kg/m.  General Appearance: Casual  Eye Contact::  Good  Speech:  Clear and Coherent  Volume:  Normal  Mood:  Euthymic  Affect:  Congruent  Thought Process:  Coherent  Orientation:  Negative  Thought Content:  Negative  Suicidal Thoughts:  No  Homicidal Thoughts:  No  Memory:  Negative  Judgement:  Fair  Insight:  Fair  Psychomotor Activity:  Normal  Concentration:  Good  Recall:  Good  Fund of Knowledge:Good  Language: Good  Akathisia:  No  Handed:  Right  AIMS (if indicated):     Assets:  Resilience  Sleep:  Number of Hours: 5  Cognition: WNL  ADL's:  Intact   Mental Status Per Nursing Assessment::   On Admission:     Demographic Factors:  Male and Low socioeconomic status  Loss Factors: NA  Historical Factors: NA  Risk Reduction Factors:   Positive social support  Continued Clinical Symptoms:  Alcohol/Substance Abuse/Dependencies  Cognitive Features That Contribute To Risk:  None    Suicide Risk:  Mild:  Suicidal ideation of limited frequency, intensity, duration, and specificity.  There are no identifiable plans,  no associated intent, mild dysphoria and related symptoms, good self-control (both objective and subjective assessment), few other risk factors, and identifiable protective factors, including available and accessible social support.  Follow-up Information    [facility].   Why:  Appt on this date 1:00PM for medication management/psychiatry. Please resume your pre-scheduled weekly group therapy upon discharge. Thank you.  Contact information: [address] [place], [place] [address] Phone: [phone] ext. [phone] Fax: [phone]          Plan Of Care/Follow-up recommendations:  Other:  Patient denies any current suicidal or homicidal ideation, plan or intent. He is encouraged to continue his follow-up with [facility] [place] as directed.  Acquanetta SitElizabeth Woods Nowell Sites, MD 10/04/2016, 3:14 PM

## 2016-10-04 NOTE — Progress Notes (Signed)
D:Pt rates depression as a 4 and anxiety as a 3 on 0-10 scale with 10 being the most. Pt says that his goal is to go to NA meetings and stay focused not to relapse on cocaine.  A:Offered support, encouragement and 15 minute checks.  R:Pt denies si and hi. Safety maintained on the unit.

## 2016-10-04 NOTE — [facility] Group Notes (Signed)
[facility] LCSW Group Therapy  10/04/2016 2:54 PM  Type of Therapy:  Group Therapy  Participation Level:  Did Not Attend-pt invited.   Summary of Progress/Problems: Today's Topic: Overcoming Obstacles. Patients identified one short term goal and potential obstacles in reaching this goal. Patients processed barriers involved in overcoming these obstacles. Patients identified steps necessary for overcoming these obstacles and explored motivation (internal and external) for facing these difficulties head on.   Hareem Surowiec N Smart LCSW 10/04/2016, 2:54 PM

## 2016-10-04 NOTE — Progress Notes (Signed)
Pt did not attend the NA meeting this evening.  

## 2016-10-04 NOTE — Progress Notes (Signed)
[facility] MD Progress Note  10/04/2016 9:01 AM  Patient Active Problem List   Diagnosis Date Noted  . MDD (major depressive disorder), recurrent severe, without psychosis ([place]) 09/07/2015  . Major depressive disorder, recurrent, severe without psychotic features ([place])   . Cocaine dependence ([place]) 08/31/2014  . Alcohol abuse 08/31/2014    Diagnosis: Cocaine use disorder, severe, depression  Subjective: Patient states he is experiencing no current suicidal or homicidal ideation, plan or intent and that he feels his medications are working well without side effects. He reports at present course continues he will would like to be discharged tomorrow.  Objective: Well-developed well nourished man in no apparent distress currently resting in bed but easily arousable pleasant and appropriate mood is good affect is congruent thought processes linear and goal-directed thought content denies current suicidal or homicidal ideation, plan or intent alert and oriented IQ appears an average range insight and judgment are fair     Current Facility-Administered Medications (Cardiovascular):  .  lisinopril (PRINIVIL,ZESTRIL) tablet 10 mg   Current Facility-Administered Medications (Respiratory):  .  guaiFENesin (ROBITUSSIN) 100 MG/5ML solution 100 mg   Current Facility-Administered Medications (Analgesics):  .  acetaminophen (TYLENOL) tablet 650 mg     Current Facility-Administered Medications (Other):  .  alum & mag hydroxide-simeth (MAALOX/MYLANTA) 200-200-20 MG/5ML suspension 30 mL .  buPROPion (WELLBUTRIN XL) 24 hr tablet 300 mg .  magnesium hydroxide (MILK OF MAGNESIA) suspension 30 mL .  nicotine (NICODERM CQ - dosed in mg/24 hours) patch 21 mg .  pantoprazole (PROTONIX) EC tablet 40 mg .  traZODone (DESYREL) tablet 50 mg  No current outpatient prescriptions on file.  Vital Signs:Blood pressure (!) 137/93, pulse 71, temperature 97.6 F (36.4 C), temperature source Oral, resp. rate 18,  height _0  (1.727 m), weight 113.4 kg (250 lb).    Lab Results:  Results for orders placed or performed during the hospital encounter of 10/02/16 (from the past 48 hour(s))  Basic metabolic panel     Status: Abnormal   Collection Time: 10/04/16  6:16 AM  Result Value Ref Range   Sodium 141 135 - 145 mmol/L   Potassium 3.7 3.5 - 5.1 mmol/L   Chloride 108 101 - 111 mmol/L   CO2 27 22 - 32 mmol/L   Glucose, Bld 104 (H) 65 - 99 mg/dL   BUN 14 6 - 20 mg/dL   Creatinine, Ser 1.26 (H) 0.61 - 1.24 mg/dL   Calcium 8.6 (L) 8.9 - 10.3 mg/dL   GFR calc non Af Amer >60 >60 mL/min   GFR calc Af Amer >60 >60 mL/min    Comment: (NOTE) The eGFR has been calculated using the CKD EPI equation. This calculation has not been validated in all clinical situations. eGFR's persistently <60 mL/min signify possible Chronic Kidney Disease.    Anion gap 6 5 - 15    Comment: Performed at [facility]  TSH     Status: None   Collection Time: 10/04/16  6:16 AM  Result Value Ref Range   TSH 1.862 0.350 - 4.500 uIU/mL    Comment: Performed by a 3rd Generation assay with a functional sensitivity of <=0.01 uIU/mL. Performed at [facility]     Physical Findings: AIMS: Facial and Oral Movements Muscles of Facial Expression: None, normal Lips and Perioral Area: None, normal Jaw: None, normal Tongue: None, normal,Extremity Movements Upper (arms, wrists, hands, fingers): None, normal Lower (legs, knees, ankles, toes): None, normal, Trunk Movements Neck, shoulders, hips: None,  normal, Overall Severity Severity of abnormal movements (highest score from questions above): None, normal Incapacitation due to abnormal movements: None, normal Patient's awareness of abnormal movements (rate only patient's report): No Awareness,    CIWA:  CIWA-Ar Total: 1 COWS:  COWS Total Score: 1   Assessment/Plan: Patient appears to be improving and if present course continues will be  discharged tomorrow morning.  Linard Millers, MD 10/04/2016, 9:01 AM

## 2016-10-04 NOTE — Progress Notes (Signed)
Recreation Therapy Notes  Date: 10/04/16 Time: 0930 Location: 300 Dayroom  Group Topic: Stress Management  Goal Area(s) Addresses:  Patient will verbalize importance of using healthy stress management.  Patient will identify positive emotions associated with healthy stress management.   Intervention: Stress Management  Activity :  Guided Imagery.  LRT introduced the stress management technique of guided imagery to the patients.  LRT read a script to allow patients to envision their peaceful place.  Patients were to follow allow with the script to engage in the activity.    Education:  Stress Management, Discharge Planning.   Education Outcome: Acknowledges edcuation/In group clarification offered/Needs additional education  Clinical Observations/Feedback: Pt did not attend group.   Caroll RancherMarjette Ha Shannahan, LRT/CTRS         Caroll RancherLindsay, Aria Jarrard A 10/04/2016 12:35 PM

## 2016-10-05 MED ORDER — PANTOPRAZOLE SODIUM 40 MG PO TBEC: 40.0000 mg | DELAYED_RELEASE_TABLET | Freq: Every day | ORAL | 0 refills | Status: DC

## 2016-10-05 MED ORDER — NICOTINE 21 MG/24HR TD PT24: 21.0000 mg | MEDICATED_PATCH | Freq: Every day | TRANSDERMAL | 0 refills | Status: DC

## 2016-10-05 MED ORDER — LISINOPRIL 10 MG PO TABS: 10.0000 mg | ORAL_TABLET | Freq: Every day | ORAL | 0 refills | Status: DC

## 2016-10-05 MED ORDER — TRAZODONE HCL 50 MG PO TABS: 50.0000 mg | ORAL_TABLET | Freq: Every evening | ORAL | 0 refills | Status: DC | PRN

## 2016-10-05 MED ORDER — BUPROPION HCL ER (XL) 300 MG PO TB24: 300.0000 mg | ORAL_TABLET | Freq: Every day | ORAL | 0 refills | Status: DC

## 2016-10-05 NOTE — Tx Team (Signed)
Interdisciplinary Treatment and Diagnostic Plan Update  10/05/2016 Time of Session: 9:30AM Niv Darley MRN: [id number]  Principal Diagnosis: MDD (major depressive disorder), recurrent severe, without psychosis ([place])  Secondary Diagnoses: Principal Problem:   MDD (major depressive disorder), recurrent severe, without psychosis ([place]) Active Problems:   Cocaine dependence ([place])   Current Medications:  Current Facility-Administered Medications  Medication Dose Route Frequency Provider Last Rate Last Dose  . acetaminophen (TYLENOL) tablet 650 mg  650 mg Oral Q6H PRN Lurena Nida, NP      . alum & mag hydroxide-simeth (MAALOX/MYLANTA) 200-200-20 MG/5ML suspension 30 mL  30 mL Oral Q4H PRN Lurena Nida, NP      . buPROPion (WELLBUTRIN XL) 24 hr tablet 300 mg  300 mg Oral Daily Linard Millers, MD   300 mg at 10/05/16 0801  . guaiFENesin (ROBITUSSIN) 100 MG/5ML solution 100 mg  5 mL Oral Q4H PRN Linard Millers, MD   100 mg at 10/04/16 2120  . lisinopril (PRINIVIL,ZESTRIL) tablet 10 mg  10 mg Oral Daily Linard Millers, MD   10 mg at 10/05/16 0801  . magnesium hydroxide (MILK OF MAGNESIA) suspension 30 mL  30 mL Oral Daily PRN Lurena Nida, NP      . nicotine (NICODERM CQ - dosed in mg/24 hours) patch 21 mg  21 mg Transdermal Daily Laverle Hobby, PA-C   21 mg at 10/05/16 0802  . pantoprazole (PROTONIX) EC tablet 40 mg  40 mg Oral Daily Linard Millers, MD   40 mg at 10/05/16 0801  . traZODone (DESYREL) tablet 50 mg  50 mg Oral QHS,MR X 1 Spencer E Simon, PA-C   50 mg at 10/05/16 0038   PTA Medications: Prescriptions Prior to Admission  Medication Sig Dispense Refill Last Dose  . buPROPion (WELLBUTRIN XL) 300 MG 24 hr tablet Take 300 mg by mouth daily.   Past Week at Unknown time  . hydrOXYzine (ATARAX/VISTARIL) 25 MG tablet Take 1 tablet (25 mg total) by mouth 3 (three) times daily as needed for anxiety. (Patient taking differently: Take 50 mg by mouth 2 (two)  times daily as needed for anxiety. ) 15 tablet 0 Past Week at Unknown time  . lisinopril-hydrochlorothiazide (PRINZIDE,ZESTORETIC) 10-12.5 MG per tablet Take 1 tablet by mouth daily. (Patient not taking: Reported on 10/02/2016)   Not Taking at Unknown time  . lisinopril-hydrochlorothiazide (PRINZIDE,ZESTORETIC) 20-12.5 MG tablet Take 1 tablet by mouth daily.   Past Week at Unknown time  . Melatonin 3 MG TABS Take 6 mg by mouth at bedtime as needed (sleep).   Past Week at Unknown time  . nicotine (NICODERM CQ - DOSED IN MG/24 HOURS) 21 mg/24hr patch Place 21 mg onto the skin daily.   Past Month at Unknown time  . pantoprazole (PROTONIX) 40 MG tablet Take 1 tablet (40 mg total) by mouth daily. 30 tablet 0 10/01/2016 at Unknown time  . sildenafil (VIAGRA) 100 MG tablet Take 300 mg by mouth daily as needed for erectile dysfunction.   Past Month at Unknown time  . traZODone (DESYREL) 50 MG tablet Take 1 tablet (50 mg total) by mouth at bedtime as needed for sleep. (Patient not taking: Reported on 10/02/2016) 30 tablet 0 Not Taking at Unknown time  . verapamil (CALAN-SR) 240 MG CR tablet Take 1 tablet (240 mg total) by mouth at bedtime. (Patient not taking: Reported on 10/02/2016) 30 tablet 0 Not Taking at Unknown time    Patient Stressors: Loss of girlfriend,  3 years ago Substance abuse Other: insomnia  Patient Strengths: Ability for insight Capable of independent living Communication skills General fund of knowledge  Treatment Modalities: Medication Management, Group therapy, Case management,  1 to 1 session with clinician, Psychoeducation, Recreational therapy.   Physician Treatment Plan for Primary Diagnosis: MDD (major depressive disorder), recurrent severe, without psychosis ([place]) Long Term Goal(s): Improvement in symptoms so as ready for discharge Improvement in symptoms so as ready for discharge   Short Term Goals: Ability to disclose and discuss suicidal ideas Ability to demonstrate  self-control will improve Ability to identify changes in lifestyle to reduce recurrence of condition will improve Ability to identify triggers associated with substance abuse/mental health issues will improve  Medication Management: Evaluate patient's response, side effects, and tolerance of medication regimen.  Therapeutic Interventions: 1 to 1 sessions, Unit Group sessions and Medication administration.  Evaluation of Outcomes: Met  Physician Treatment Plan for Secondary Diagnosis: Principal Problem:   MDD (major depressive disorder), recurrent severe, without psychosis ([place]) Active Problems:   Cocaine dependence ([place])  Long Term Goal(s): Improvement in symptoms so as ready for discharge Improvement in symptoms so as ready for discharge   Short Term Goals: Ability to disclose and discuss suicidal ideas Ability to demonstrate self-control will improve Ability to identify changes in lifestyle to reduce recurrence of condition will improve Ability to identify triggers associated with substance abuse/mental health issues will improve     Medication Management: Evaluate patient's response, side effects, and tolerance of medication regimen.  Therapeutic Interventions: 1 to 1 sessions, Unit Group sessions and Medication administration.  Evaluation of Outcomes: Met  RN Treatment Plan for Primary Diagnosis: MDD (major depressive disorder), recurrent severe, without psychosis ([place]) Long Term Goal(s): Knowledge of disease and therapeutic regimen to maintain health will improve  Short Term Goals: Ability to remain free from injury will improve, Ability to disclose and discuss suicidal ideas and Ability to identify and develop effective coping behaviors will improve  Medication Management: RN will administer medications as ordered by provider, will assess and evaluate patient's response and provide education to patient for prescribed medication. RN will report any adverse and/or side effects  to prescribing provider.  Therapeutic Interventions: 1 on 1 counseling sessions, Psychoeducation, Medication administration, Evaluate responses to treatment, Monitor vital signs and CBGs as ordered, Perform/monitor CIWA, COWS, AIMS and Fall Risk screenings as ordered, Perform wound care treatments as ordered.  Evaluation of Outcomes: Met   LCSW Treatment Plan for Primary Diagnosis: MDD (major depressive disorder), recurrent severe, without psychosis ([place]) Long Term Goal(s): Safe transition to appropriate next level of care at discharge, Engage patient in therapeutic group addressing interpersonal concerns.  Short Term Goals: Engage patient in aftercare planning with referrals and resources, Facilitate patient progression through stages of change regarding substance use diagnoses and concerns and Identify triggers associated with mental health/substance abuse issues  Therapeutic Interventions: Assess for all discharge needs, 1 to 1 time with Social worker, Explore available resources and support systems, Assess for adequacy in community support network, Educate family and significant other(s) on suicide prevention, Complete Psychosocial Assessment, Interpersonal group therapy.  Evaluation of Outcomes: Met  Progress in Treatment: Attending groups: Intermittently  Participating in groups: Yes, when he attends.  Taking medication as prescribed: Yes. Toleration medication: Yes. Family/Significant other contact made: SPE completed with pt; pt declined to consent to family contact.  Patient understands diagnosis: Yes. Discussing patient identified problems/goals with staff: Yes. Medical problems stabilized or resolved: Yes. Denies suicidal/homicidal ideation: Yes, during self  report.  Issues/concerns per patient self-inventory: No. Other: n/a  New problem(s) identified: No, Describe:  n/a  New Short Term/Long Term Goal(s): medication stabilization; detox; development of comprehensive mental  wellness/sobriety plan.   Discharge Plan or Barriers: Pt plans to return home; [facility] for group counseling and medication management. Car in parking lot--pt will drive himself home.   Reason for Continuation of Hospitalization: none  Estimated Length of Stay: d/c today   Attendees: Patient: 10/05/2016 8:26 AM  Physician:  Dr. Sharolyn Douglas MD 10/05/2016 8:26 AM  Nursing: Jacqulyn Ducking RN 10/05/2016 8:26 AM  RN Care Manager: Lars Pinks CM 10/05/2016 8:26 AM  Social Worker: Maxie Better, LCSW; [place] Drinkard LCSW 10/05/2016 8:26 AM  Recreational Therapist:  10/05/2016 8:26 AM  Other: Samuel Jester NP; Lindell Spar NP 10/05/2016 8:26 AM  Other:  10/05/2016 8:26 AM  Other: 10/05/2016 8:26 AM    Scribe for Treatment Team: Catherine, LCSW 10/05/2016 8:26 AM

## 2016-10-05 NOTE — Progress Notes (Signed)
  [facility] Adult Case Management Discharge Plan :  Will you be returning to the same living situation after discharge:  Yes,  home At discharge, do you have transportation home?: Yes,  car in parking lot Do you have the ability to pay for your medications: Yes,  [facility] benefits  Release of information consent forms completed and submitted to medical records by CSW.  Patient to Follow up at: Follow-up Information    [facility]/2016.   Why:  Appt on this date 1:00PM for medication management/psychiatry. Please resume your pre-scheduled weekly group therapy upon discharge. Thank you.  Contact information: [address] [place], [place] [address] Phone: [phone] ext. [phone] Fax: [phone]          Next level of care provider has access to [facility] Link:no  Safety Planning and Suicide Prevention discussed: Yes,  SPE completed with pt; pt declined to consent to family contact.  Have you used any form of tobacco in the last 30 days? (Cigarettes, Smokeless Tobacco, Cigars, and/or Pipes): Yes  Has patient been referred to the Quitline?: Patient refused referral  Patient has been referred for addiction treatment: Yes  Clella Mckeel N Smart LCSW 10/05/2016, 8:23 AM

## 2016-10-05 NOTE — Progress Notes (Signed)
Pt d/c from the hospital. All items returned. D/C instructions given and prescriptions given. Pt denies si and hi. 

## 2016-10-05 NOTE — Discharge Summary (Signed)
Physician Discharge Summary Note  Patient:  Alexander Munoz is an 55 y.o., male MRN:  [id number] DOB:  [birth year] Patient phone:  [phone] (home)  Patient address:   [address] [address],   Total Time spent with patient: Greater than 30 minutes  Date of Admission:  10/02/2016  Date of Discharge: 10/06/2015  Reason for Admission: Worsening symptoms of depression triggering suicidal ideation.  Principal Problem: MDD (major depressive disorder), recurrent severe, without psychosis [facility])  Discharge Diagnoses: Patient Active Problem List   Diagnosis Date Noted  . MDD (major depressive disorder), recurrent severe, without psychosis ([place]) [F33.2] 09/07/2015  . Major depressive disorder, recurrent, severe without psychotic features ([place]) [F33.2]   . Cocaine dependence ([place]) [F14.20] 08/31/2014  . Alcohol abuse [F10.10] 08/31/2014   Musculoskeletal: Strength & Muscle Tone: within normal limits Gait & Station: normal Patient leans: N/A  Psychiatric Specialty Exam: SEE SRA BY MD Physical Exam  Nursing note and vitals reviewed. Constitutional: He is oriented to person, place, and time. He appears well-developed and well-nourished.  HENT:  Head: Normocephalic.  Eyes: Pupils are equal, round, and reactive to light.  Neck: Normal range of motion.  Cardiovascular: Normal rate.   Respiratory: Effort normal.  GI: Soft.  Genitourinary:  Genitourinary Comments: Deferred  Musculoskeletal: Normal range of motion.  Neurological: He is alert and oriented to person, place, and time.  Skin: Skin is warm and dry.  Psychiatric: He has a normal mood and affect. His behavior is normal. Thought content normal.    Review of Systems  Constitutional: Negative.   HENT: Negative.   Eyes: Negative.   Respiratory: Negative.   Cardiovascular: Negative.   Gastrointestinal: Negative.   Genitourinary: Negative.   Musculoskeletal: Negative.   Skin: Negative.   Neurological:  Negative.   Endo/Heme/Allergies: Negative.   Psychiatric/Behavioral: Positive for depression (Stable ) and substance abuse (Hx. Cocaine & alcohol dependence). Negative for hallucinations, memory loss and suicidal ideas. The patient has insomnia (Stable). The patient is not nervous/anxious.   All other systems reviewed and are negative.   Blood pressure (!) 147/89, pulse 72, temperature 98 F (36.7 C), temperature source Oral, resp. rate 18, height '5\' 8"'  (1.727 m), weight 113.4 kg (250 lb).Body mass index is 38.01 kg/m.  Have you used any form of tobacco in the last 30 days? (Cigarettes, Smokeless Tobacco, Cigars, and/or Pipes): Yes  Has this patient used any form of tobacco in the last 30 days? (Cigarettes, Smokeless Tobacco, Cigars, and/or Pipes) Yes, A prescription for an FDA-approved tobacco cessation medication was offered at discharge and the patient refused  Past Medical History:  Past Medical History:  Diagnosis Date  . Anxiety   . Depression   . GERD (gastroesophageal reflux disease)   . Hypertension   . Lactose intolerance   . Migraines    History reviewed. No pertinent surgical history.  Family History:  Family History  Problem Relation Age of Onset  . Cancer Other   . Hypertension Other   . Heart attack Other    Social History:  History  Alcohol Use  . Yes    Comment: Alcohol 3x's a week      History  Drug Use  . Types: Cocaine    Comment: Daily cocaine use     Social History   Social History  . Marital status: Legally Separated    Spouse name: N/A  . Number of children: N/A  . Years of education: N/A   Social History Main Topics  .  Smoking status: Current Every Day Smoker    Packs/day: 1.00    Years: 5.00    Types: Cigarettes  . Smokeless tobacco: Never Used  . Alcohol use Yes     Comment: Alcohol 3x's a week   . Drug use:     Types: Cocaine     Comment: Daily cocaine use   . Sexual activity: No   Other Topics Concern  . None   Social  History Narrative  . None   Risk to Self: Suicidal Ideation: Yes-Currently Present Suicidal Intent: Yes-Currently Present Is patient at risk for suicide?: Yes Suicidal Plan?: Yes-Currently Present Specify Current Suicidal Plan: Plan to drive his car off the road Access to Means: Yes Specify Access to Suicidal Means: Car - patient drove himself to [facility] What has been your use of drugs/alcohol within the last 12 months?: Cocaine How many times?: 1 Other Self Harm Risks: None Reported Triggers for Past Attempts: Unpredictable Intentional Self Injurious Behavior: None  Risk to Others: Homicidal Ideation: No Thoughts of Harm to Others: No Current Homicidal Intent: No Current Homicidal Plan: No Access to Homicidal Means: No Identified Victim: NA History of harm to others?: No Assessment of Violence: None Noted Violent Behavior Description: None Reported Does patient have access to weapons?: No Criminal Charges Pending?: No Does patient have a court date: Nono   Prior Inpatient Therapy: Prior Inpatient Therapy: Yes Prior Therapy Dates: 2017 - October  Prior Therapy Facilty[facility]Provider[facility]  Reason for Treatment: Overdose on sleeping pills yes   Prior Outpatient Therapy: Prior Outpatient Therapy: Yes Prior Therapy Dates: Ongoing  [facility] [place] Reason for Treatment: Substance Abuse and [place] Does patient have an ACCT team?: No Does patient have Intensive In-House Services?  : No Does patient have [facility] services? : No Does patient have P4CC services?: [place]  Level of Care:  OP  Hospital Course:  Patient who usually follows up with the [facility] in [place] presents with complaints of suicidal ideation. Reports stressors of relapsing after 40 clean days and the anniversary of the death of a girlfriend from cancer 3 years ago. He reports that he took an overdose of his sleeping medication and was  hospitalized in [place] in October 2017. The patient states that for him depression is a feeling of lack of energy, being sad having some loss of interest in activities at sleeping more which lasts about a week or so. He states he feels he first noted depression in his late 3s and first sought treatment for it around age 37 and that [facility]  Cord was admitted to the [facility] adult unit with complaints of worsening symptoms of depression triggering suicidal ideations. He cited his recent relapse on cocaine after 4 months sobriety as the trigger. He was in need of mood stabilization treatments.   During the course of his hospital stay, Jone was medicated & discharged on, Wellbutrin 300 mg for depression, Nicotine patch 21 mg for smoking cessation & Trazodone 50 mg insomnia. He was enrolled & participated in the group counseling sessions being offered & held on this unit. He was counseled & learned coping skills that should help him cope better & maintain mood stability after discharge. He was resumed on all her pertinent home medications for the other previously existing medical issues presented. He tolerated his treatment regimen without any adverse effects reported.   While his treatment was on going, Vong's  improvement was monitored by observation & his daily reports of symptom reduction noted. His emotional & mental status were monitored by daily self-inventory reports completed by her & the clinical staff.          Quillan was evaluated daily by the treatment team for mood stability & the need for continued recovery after discharge. His motivation was an integral factor in his recovery & mood stability. He was offered further treatment options upon discharge & will follow up with the outpatient psychiatric services as listed below.     Upon discharge, Gerome was both mentally & medically stable for discharge. She is currently denying suicidal, homicidal ideation,  auditory, visual/tactile hallucinations, delusional thoughts & or paranoia. He left [facility] with all personal belongings in no apparent distress. Transportation per self.  Consults:  psychiatry  Discharge Vitals:   Blood pressure (!) 147/89, pulse 72, temperature 98 F (36.7 C), temperature source Oral, resp. rate 18, height '5\' 8"'  (1.727 m), weight 113.4 kg (250 lb). Body mass index is 38.01 kg/m.  Lab Results:   Results for orders placed or performed during the hospital encounter of 10/02/16 (from the past 72 hour(s))  Basic metabolic panel     Status: Abnormal   Collection Time: 10/04/16  6:16 AM  Result Value Ref Range   Sodium 141 135 - 145 mmol/L   Potassium 3.7 3.5 - 5.1 mmol/L   Chloride 108 101 - 111 mmol/L   CO2 27 22 - 32 mmol/L   Glucose, Bld 104 (H) 65 - 99 mg/dL   BUN 14 6 - 20 mg/dL   Creatinine, Ser 1.26 (H) 0.61 - 1.24 mg/dL   Calcium 8.6 (L) 8.9 - 10.3 mg/dL   GFR calc non Af Amer >60 >60 mL/min   GFR calc Af Amer >60 >60 mL/min    Comment: (NOTE) The eGFR has been calculated using the CKD EPI equation. This calculation has not been validated in all clinical situations. eGFR's persistently <60 mL/min signify possible Chronic Kidney Disease.    Anion gap 6 5 - 15    Comment: Performed at [facility]  TSH     Status: None   Collection Time: 10/04/16  6:16 AM  Result Value Ref Range   TSH 1.862 0.350 - 4.500 uIU/mL    Comment: Performed by a 3rd Generation assay with a functional sensitivity of <=0.01 uIU/mL. Performed at [facility]   Vitamin B12     Status: None   Collection Time: 10/04/16  6:16 AM  Result Value Ref Range   Vitamin B-12 353 180 - 914 pg/mL    Comment: (NOTE) This assay is not validated for testing neonatal or myeloproliferative syndrome specimens for Vitamin B12 levels. Performed at [facility]    Physical Findings: AIMS: Facial and Oral Movements Muscles of Facial Expression: None,  normal Lips and Perioral Area: None, normal Jaw: None, normal Tongue: None, normal,Extremity Movements Upper (arms, wrists, hands, fingers): None, normal Lower (legs, knees, ankles, toes): None, normal, Trunk Movements Neck, shoulders, hips: None, normal, Overall Severity Severity of abnormal movements (highest score from questions above): None, normal Incapacitation due to abnormal movements: None, normal Patient's awareness of abnormal movements (rate only patient's report): No Awareness,    CIWA:  CIWA-Ar Total: 1 COWS:  COWS Total Score: 1  See Psychiatric Specialty Exam and Suicide Risk Assessment completed by Attending Physician prior to discharge.  Discharge destination:  Home  Is patient on multiple antipsychotic therapies at discharge:  No   Has Patient had three or more failed trials of antipsychotic monotherapy by history:  No  Recommended Plan for Multiple Antipsychotic Therapies: NA    Medication List    STOP taking these medications   hydrOXYzine 25 MG tablet Commonly known as:  ATARAX/VISTARIL   lisinopril-hydrochlorothiazide 10-12.5 MG tablet Commonly known as:  PRINZIDE,ZESTORETIC   lisinopril-hydrochlorothiazide 20-12.5 MG tablet Commonly known as:  PRINZIDE,ZESTORETIC   Melatonin 3 MG Tabs   verapamil 240 MG CR tablet Commonly known as:  CALAN-SR   VIAGRA 100 MG tablet Generic drug:  sildenafil     TAKE these medications     Indication  buPROPion 300 MG 24 hr tablet Commonly known as:  WELLBUTRIN XL Take 1 tablet (300 mg total) by mouth daily. For depression Start taking on:  10/06/2016 What changed:  additional instructions  Indication:  Major Depressive Disorder   lisinopril 10 MG tablet Commonly known as:  PRINIVIL,ZESTRIL Take 1 tablet (10 mg total) by mouth daily. For high blood pressure Start taking on:  10/06/2016  Indication:  High Blood Pressure Disorder   nicotine 21 mg/24hr patch Commonly known as:  NICODERM CQ - dosed in mg/24  hours Place 1 patch (21 mg total) onto the skin daily. For smoking cessation What changed:  additional instructions  Indication:  Nicotine Addiction   pantoprazole 40 MG tablet Commonly known as:  PROTONIX Take 1 tablet (40 mg total) by mouth daily. For acid reflux What changed:  additional instructions  Indication:  Gastroesophageal Reflux Disease   traZODone 50 MG tablet Commonly known as:  DESYREL Take 1 tablet (50 mg total) by mouth at bedtime as needed for sleep.  Indication:  [place] Follow up on 11/01/2016.   Why:  Appt on this date 1:00PM for medication management/psychiatry. Please resume your pre-scheduled weekly group therapy upon discharge. Thank you.  Contact information: [address] [place], [place] [address] Phone: [phone] ext. [phone] Fax: [phone]         Follow-up recommendations: Activity:  As tolerated Diet: As recommended by your primary care doctor. Keep all scheduled follow-up appointments as recommended.   Comments: Patient is instructed prior to discharge to: Take all medications as prescribed by his/her mental healthcare provider. Report any adverse effects and or reactions from the medicines to his/her outpatient provider promptly. Patient has been instructed & cautioned: To not engage in alcohol and or illegal drug use while on prescription medicines. In the event of worsening symptoms, patient is instructed to call the crisis hotline, 911 and or go to the nearest ED for appropriate evaluation and treatment of symptoms. To follow-up with his/her primary care provider for your other medical issues, concerns and or health care needs.    Signed: Encarnacion Slates FNP, PMHNP- [facility] 10/05/2016, 9:56 AM

## 2016-10-05 NOTE — [facility] Group Notes (Signed)
[facility] Group Notes:  (Nursing/MHT/Case Management/Adjunct)  Date:  10/05/2016  Time:  0900  Type of Therapy:  Nurse Education  Participation Level:  Active  Participation Quality:  Appropriate and Sharing  Affect:  Appropriate  Cognitive:  Alert and Appropriate  Insight:  Improving  Engagement in Group:  Engaged and Supportive  Modes of Intervention:  Discussion, Education, Socialization and Support   Summary of Progress/Problems:The purpose of this group is to assist patients to identify a goal for today and introduce patients to the benefits of aromatherapy. Pt discussed with th group [facility] and [facility] discussed [facility] as a coping skill to keep him busy and not using drugs. Beatrix ShipperWright, Alexander Munoz 10/05/2016, 0900

## 2016-10-05 NOTE — Progress Notes (Signed)
Alexander Munoz. Alexander Munoz had been up and visible in milieu this evening, seen interacting appropriately with peers. Alexander Munoz spoke of impending discharge in the morning, denied SI but did endorse some anxiety. Alexander Munoz did receive bedtime medication without incident. A. Support and encouragement provided. R. Safety maintained, will continue to monitor.

## 2016-12-22 ENCOUNTER — Emergency Department (HOSPITAL_COMMUNITY)
Admission: EM | Admit: 2016-12-22 | Discharge: 2016-12-22 | Disposition: A | Discharge status: Other Acute Inpatient Hospital | Payer: Non-veteran care | Attending: Emergency Medicine | Admitting: Emergency Medicine

## 2016-12-22 ENCOUNTER — Encounter (HOSPITAL_COMMUNITY): Payer: Self-pay | Admitting: *Deleted

## 2016-12-22 ENCOUNTER — Ambulatory Visit (HOSPITAL_COMMUNITY)
Admission: RE | Admit: 2016-12-22 | Discharge: 2016-12-22 | Disposition: A | Discharge status: Home / Self Care | Payer: Medicare Other | Attending: Psychiatry | Admitting: Psychiatry

## 2016-12-22 DIAGNOSIS — F329 Major depressive disorder, single episode, unspecified: Secondary | ICD-10-CM

## 2016-12-22 DIAGNOSIS — F17210 Nicotine dependence, cigarettes, uncomplicated: Secondary | ICD-10-CM | POA: Insufficient documentation

## 2016-12-22 DIAGNOSIS — Z79899 Other long term (current) drug therapy: Secondary | ICD-10-CM | POA: Insufficient documentation

## 2016-12-22 DIAGNOSIS — I10 Essential (primary) hypertension: Secondary | ICD-10-CM | POA: Insufficient documentation

## 2016-12-22 DIAGNOSIS — R45851 Suicidal ideations: Secondary | ICD-10-CM | POA: Insufficient documentation

## 2016-12-22 DIAGNOSIS — F32A Depression, unspecified: Secondary | ICD-10-CM

## 2016-12-22 LAB — COMPREHENSIVE METABOLIC PANEL
ALT: 23 U/L (ref 17–63)
ANION GAP: 8 (ref 5–15)
AST: 22 U/L (ref 15–41)
Albumin: 4.3 g/dL (ref 3.5–5.0)
Alkaline Phosphatase: 64 U/L (ref 38–126)
BUN: 17 mg/dL (ref 6–20)
CHLORIDE: 105 mmol/L (ref 101–111)
CO2: 24 mmol/L (ref 22–32)
CREATININE: 1.44 mg/dL — AB (ref 0.61–1.24)
Calcium: 9 mg/dL (ref 8.9–10.3)
GFR, EST NON AFRICAN AMERICAN: 53 mL/min — AB (ref >60)
Glucose, Bld: 100 mg/dL — ABNORMAL HIGH (ref 65–99)
Potassium: 3.8 mmol/L (ref 3.5–5.1)
SODIUM: 137 mmol/L (ref 135–145)
Total Bilirubin: 0.5 mg/dL (ref 0.3–1.2)
Total Protein: 7.3 g/dL (ref 6.5–8.1)

## 2016-12-22 LAB — ACETAMINOPHEN LEVEL

## 2016-12-22 LAB — CBC
HCT: 37 % — ABNORMAL LOW (ref 39.0–52.0)
Hemoglobin: 12.4 g/dL — ABNORMAL LOW (ref 13.0–17.0)
MCH: 25.5 pg — ABNORMAL LOW (ref 26.0–34.0)
MCHC: 33.5 g/dL (ref 30.0–36.0)
MCV: 76.1 fL — ABNORMAL LOW (ref 78.0–100.0)
PLATELETS: 417 10*3/uL — AB (ref 150–400)
RBC: 4.86 MIL/uL (ref 4.22–5.81)
RDW: 14.8 % (ref 11.5–15.5)
WBC: 7.8 10*3/uL (ref 4.0–10.5)

## 2016-12-22 LAB — SALICYLATE LEVEL

## 2016-12-22 LAB — RAPID URINE DRUG SCREEN, HOSP PERFORMED
Amphetamines: NOT DETECTED
Barbiturates: NOT DETECTED
Benzodiazepines: NOT DETECTED
COCAINE: POSITIVE — AB
OPIATES: POSITIVE — AB
Tetrahydrocannabinol: NOT DETECTED

## 2016-12-22 LAB — ETHANOL

## 2016-12-22 MED ORDER — TRAZODONE HCL 50 MG PO TABS
50.0000 mg | ORAL_TABLET | Freq: Every evening | ORAL | Status: DC | PRN
Start: 2016-12-22 — End: 2016-12-22

## 2016-12-22 MED ORDER — LISINOPRIL 20 MG PO TABS: 20.0000 mg | ORAL_TABLET | Freq: Every day | ORAL | Status: DC

## 2016-12-22 MED ORDER — PANTOPRAZOLE SODIUM 40 MG PO TBEC: 40.0000 mg | DELAYED_RELEASE_TABLET | Freq: Every day | ORAL | Status: DC

## 2016-12-22 MED ORDER — NICOTINE 21 MG/24HR TD PT24: 21.0000 mg | MEDICATED_PATCH | Freq: Every day | TRANSDERMAL | Status: DC

## 2016-12-22 MED ORDER — HYDROCHLOROTHIAZIDE 12.5 MG PO CAPS: 12.5000 mg | ORAL_CAPSULE | Freq: Every day | ORAL | Status: DC

## 2016-12-22 MED ORDER — HYDROXYZINE HCL 25 MG PO TABS: 50.0000 mg | ORAL_TABLET | Freq: Three times a day (TID) | ORAL | Status: DC | PRN

## 2016-12-22 MED ORDER — LACTINEX PO CHEW
1.0000 | CHEWABLE_TABLET | Freq: Three times a day (TID) | ORAL | Status: DC
  Filled 2016-12-22: qty 1

## 2016-12-22 MED ORDER — BUPROPION HCL ER (XL) 150 MG PO TB24: 300.0000 mg | ORAL_TABLET | Freq: Every day | ORAL | Status: DC

## 2016-12-22 NOTE — ED Notes (Signed)
Pt admitted to room #42. Pt behavior cooperative. Sad affect. Pt endorsing passive SI. Denies HI, Denies AVH. Pt reports he has been taking his medication as prescribed. Encourgement and support provided. Special checks q 15 mins in place for safety. Video monitoring in place. Will continue to monitor.

## 2016-12-22 NOTE — ED Provider Notes (Signed)
[facility]-EMERGENCY DEPT Provider Note   CSN: [id number] Arrival date & time: 12/22/16  1223     History   Chief Complaint Chief Complaint  Patient presents with  . Medical Clearance    HPI Alexander Munoz is a 56 y.o. male.  The history is provided by the patient and medical records. No language interpreter was used.   Alexander Munoz is a 56 y.o. male  with a PMH of HTN, anxiety/depression who presents to the Emergency Department complaining of depressed mood and suicidal ideations for the last week. Patient endorses plan of wanting to drive off of the road. He states similar feelings back in November of last year where he was admitted to behavioral health. Patient states he is on Wellbutrin for his depression which he has been taking as directed. No homicidal ideations. No auditory or visual hallucinations. Patient went to behavioral health this morning and was sent over for medical clearance.   Past Medical History:  Diagnosis Date  . Anxiety   . Depression   . GERD (gastroesophageal reflux disease)   . Hypertension   . Lactose intolerance   . Migraines     Patient Active Problem List   Diagnosis Date Noted  . MDD (major depressive disorder), recurrent severe, without psychosis (H[facility]) 09/07/2015  . Major depressive disorder, recurrent, severe without psychotic features ([facility])   . Cocaine dependence ([facility]) 08/31/2014  . Alcohol abuse 08/31/2014    History reviewed. No pertinent surgical history.     Home Medications    Prior to Admission medications   Medication Sig Start Date End Date Taking? Authorizing Provider  buPROPion (WELLBUTRIN XL) 300 MG 24 hr tablet Take 1 tablet (300 mg total) by mouth daily. For depression 10/06/16  Yes Sanjuana Kava, NP  hydrochlorothiazide (MICROZIDE) 12.5 MG capsule Take 12.5 mg by mouth daily.   Yes Historical Provider, MD  hydrOXYzine (ATARAX/VISTARIL) 50 MG tablet Take 50 mg by mouth 3 (three) times daily as needed for anxiety.   Yes  Historical Provider, MD  lactobacillus acidophilus & bulgar (LACTINEX) chewable tablet Chew 1 tablet by mouth 3 (three) times daily with meals.   Yes Historical Provider, MD  lisinopril (PRINIVIL,ZESTRIL) 20 MG tablet Take 20 mg by mouth daily.   Yes Historical Provider, MD  Melatonin 5 MG TABS Take 1 tablet by mouth at bedtime as needed. For sleep   Yes Historical Provider, MD  nicotine (NICODERM CQ - DOSED IN MG/24 HOURS) 21 mg/24hr patch Place 1 patch (21 mg total) onto the skin daily. For smoking cessation 10/05/16  Yes Sanjuana Kava, NP  pantoprazole (PROTONIX) 40 MG tablet Take 1 tablet (40 mg total) by mouth daily. For acid reflux 10/05/16  Yes Sanjuana Kava, NP  traZODone (DESYREL) 50 MG tablet Take 1 tablet (50 mg total) by mouth at bedtime as needed for sleep. 10/05/16  Yes Sanjuana Kava, NP  lisinopril (PRINIVIL,ZESTRIL) 10 MG tablet Take 1 tablet (10 mg total) by mouth daily. For high blood pressure Patient not taking: Reported on 12/22/2016 10/06/16   Sanjuana Kava, NP    Family History Family History  Problem Relation Age of Onset  . Cancer Other   . Hypertension Other   . Heart attack Other     Social History Social History  Substance Use Topics  . Smoking status: Current Every Day Smoker    Packs/day: 1.00    Years: 5.00    Types: Cigarettes  . Smokeless tobacco: Never Used  . Alcohol  use Yes     Comment: Alcohol 3x's a week      Allergies   Lactose intolerance (gi)   Review of Systems Review of Systems  Constitutional: Negative for chills and fever.  HENT: Negative for congestion.   Eyes: Negative for visual disturbance.  Respiratory: Negative for shortness of breath.   Cardiovascular: Negative for chest pain.  Gastrointestinal: Negative for abdominal pain, nausea and vomiting.  Musculoskeletal: Negative for back pain and neck pain.  Skin: Negative for rash.  Neurological: Negative for headaches.  Psychiatric/Behavioral: Positive for suicidal ideas.      Physical Exam Updated Vital Signs BP 127/68   Pulse 63   Temp 97.8 F (36.6 C) (Oral)   Resp 17   Ht 5\' 8"  (1.727 m)   Wt 74.8 kg   SpO2 99%   BMI 25.09 kg/m   Physical Exam  Constitutional: He is oriented to person, place, and time. He appears well-developed and well-nourished. No distress.  HENT:  Head: Normocephalic and atraumatic.  Cardiovascular: Normal rate, regular rhythm and normal heart sounds.   No murmur heard. Pulmonary/Chest: Effort normal and breath sounds normal. No respiratory distress.  Abdominal: Soft. He exhibits no distension. There is no tenderness.  Musculoskeletal: He exhibits no edema.  Neurological: He is alert and oriented to person, place, and time.  Skin: Skin is warm and dry.  Nursing note and vitals reviewed.    ED Treatments / Results  Labs (all labs ordered are listed, but only abnormal results are displayed) Labs Reviewed  COMPREHENSIVE METABOLIC PANEL - Abnormal; Notable for the following:       Result Value   Glucose, Bld 100 (*)    Creatinine, Ser 1.44 (*)    GFR calc non Af Amer 53 (*)    All other components within normal limits  ACETAMINOPHEN LEVEL - Abnormal; Notable for the following:    Acetaminophen (Tylenol), Serum <10 (*)    All other components within normal limits  CBC - Abnormal; Notable for the following:    Hemoglobin 12.4 (*)    HCT 37.0 (*)    MCV 76.1 (*)    MCH 25.5 (*)    Platelets 417 (*)    All other components within normal limits  RAPID URINE DRUG SCREEN, HOSP PERFORMED - Abnormal; Notable for the following:    Opiates POSITIVE (*)    Cocaine POSITIVE (*)    All other components within normal limits  ETHANOL  SALICYLATE LEVEL    EKG  EKG Interpretation None       Radiology No results found.  Procedures Procedures (including critical care time)  Medications Ordered in ED Medications  buPROPion (WELLBUTRIN XL) 24 hr tablet 300 mg (300 mg Oral Not Given 12/22/16 1506)   hydrochlorothiazide (MICROZIDE) capsule 12.5 mg (not administered)  hydrOXYzine (ATARAX/VISTARIL) tablet 50 mg (not administered)  lactobacillus acidophilus & bulgar (LACTINEX) chewable tablet 1 tablet (not administered)  lisinopril (PRINIVIL,ZESTRIL) tablet 20 mg (not administered)  nicotine (NICODERM CQ - dosed in mg/24 hours) patch 21 mg (21 mg Transdermal Refused 12/22/16 1506)  pantoprazole (PROTONIX) EC tablet 40 mg (40 mg Oral Not Given 12/22/16 1508)  traZODone (DESYREL) tablet 50 mg (not administered)     Initial Impression / Assessment and Plan / ED Course  I have reviewed the triage vital signs and the nursing notes.  Pertinent labs & imaging results that were available during my care of the patient were reviewed by me and considered in my medical decision making (  see chart for details).    Alexander Munoz is a 56 y.o. male who presents to ED from [facility] for depressed mood and suicidal ideations. Labs at baseline. Medically cleared and accepted at outpatient psychiatric facility.    Final Clinical Impressions(s) / ED Diagnoses   Final diagnoses:  Depression, unspecified depression type  Suicidal thoughts    New Prescriptions New Prescriptions   No medications on file       [facility] Hanni [place], PA-C 12/22/16 1518    Gwyneth Sprout, MD 12/22/16 1554

## 2016-12-22 NOTE — BH Assessment (Signed)
[facility]  Per Alcario Droughtanika, NP - patient meets criteria for inpatient hospitalization.  [facility].  TTS will complete and fax the referral to the [facility] for [facility] placement .   Writer informed the Charge Nurse Alana that the patient will be coming for medical clearance.

## 2016-12-22 NOTE — ED Notes (Signed)
Gave report to Ashley RN

## 2016-12-22 NOTE — BH Assessment (Signed)
[facility] Assessment Progress Note  Per Hillery Jacksanika Lewis, NP, this pt requires psychiatric hospitalization at this time.  At 15:11 Britta MccreedyBarbara [facility]  Pt has been accepted to their facility by Dr Tonita PhoenixKomissarova to the [facility].  Pt must arrive either before 23:00 or after 06:00  EDP Chaney Mallingavid Yao, MD, concurs with this disposition, as does the pt who is currently under voluntary status.  Pt's nurse, Morrie Sheldonshley, has been notified, and agrees to call report to [phone].  [facility].  Carole Deere, MA Triage Specialist [phone]

## 2016-12-22 NOTE — ED Notes (Signed)
[facility] transport called for transfer.  

## 2016-12-22 NOTE — ED Notes (Signed)
[facility] transport on unit [facility] per MD order. [facility] forms [facility] to [facility]. Pt signed for personal property and property given to transport for transfer. Pt signed e-signature. Ambulatory off unit.
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
## 2016-12-22 NOTE — ED Triage Notes (Signed)
[facility] for [facility].  Patient c/o suicidial ideation x1 week.  Patient's plan to kill himself is to drive off the road.  Denies AV[facility].  [facility].  Patient lives alone and is seperated from his wife.  Patient is on disability.

## 2016-12-22 NOTE — H&P (Signed)
Behavioral Health Medical Screening Exam  Alexander Munoz is an 56 y.o. male.  Total Time spent with patient: 30 minutes  Psychiatric Specialty Exam: Physical Exam  Nursing note and vitals reviewed. Constitutional: He is oriented to person, place, and time. He appears well-developed.  Cardiovascular: Normal rate.   Neurological: He is alert and oriented to person, place, and time.  Psychiatric: He has a normal mood and affect. His behavior is normal.    Review of Systems  Psychiatric/Behavioral: Positive for depression and suicidal ideas. The patient is nervous/anxious.     There were no vitals taken for this visit.There is no height or weight on file to calculate BMI.  General Appearance: Guarded  Eye Contact:  Minimal  Speech:  Clear and Coherent  Volume:  Decreased  Mood:  Depressed and Dysphoric  Affect:  Depressed and Flat  Thought Process:  Coherent  Orientation:  Full (Time, Place, and Person)  Thought Content:  Paranoid Ideation and Rumination  Suicidal Thoughts:  No  Homicidal Thoughts:  No  Memory:  Immediate;   Fair Recent;   Fair Remote;   Fair  Judgement:  Fair  Insight:  Fair  Psychomotor Activity:  Restlessness  Concentration: Concentration: Fair  Recall:  FiservFair  Fund of Knowledge:Fair  Language: Fair  Akathisia:  No  Handed:  Right  AIMS (if indicated):     Assets:  Communication Skills Desire for Improvement Resilience Social Support  Sleep:       Musculoskeletal: Strength & Muscle Tone: within normal limits Gait & Station: normal Patient leans: N/A  There were no vitals taken for this visit. Temp 98.7, O2 sat 97%, HR 73, B/P 123/84  Recommendations:  Based on my evaluation the patient does not appear to have an emergency medical condition.  Alexander Rackanika N Anh Mangano, NP 12/22/2016, 10:21 AM

## 2016-12-23 DIAGNOSIS — F4310 Post-traumatic stress disorder, unspecified: Secondary | ICD-10-CM | POA: Insufficient documentation

## 2017-02-16 IMAGING — CR DG CHEST 2V
2 series · 2 of 2 positions shown · non-contrast
Comparison: 03/02/2006

CLINICAL DATA: Pt states that he got out of rehab yesterday and
started smoking crack again today. Pt c/o left arm and left leg
numbness, anxiety. Headache today. Hx htn.

EXAM:
CHEST  2 VIEW

[w chest pa]
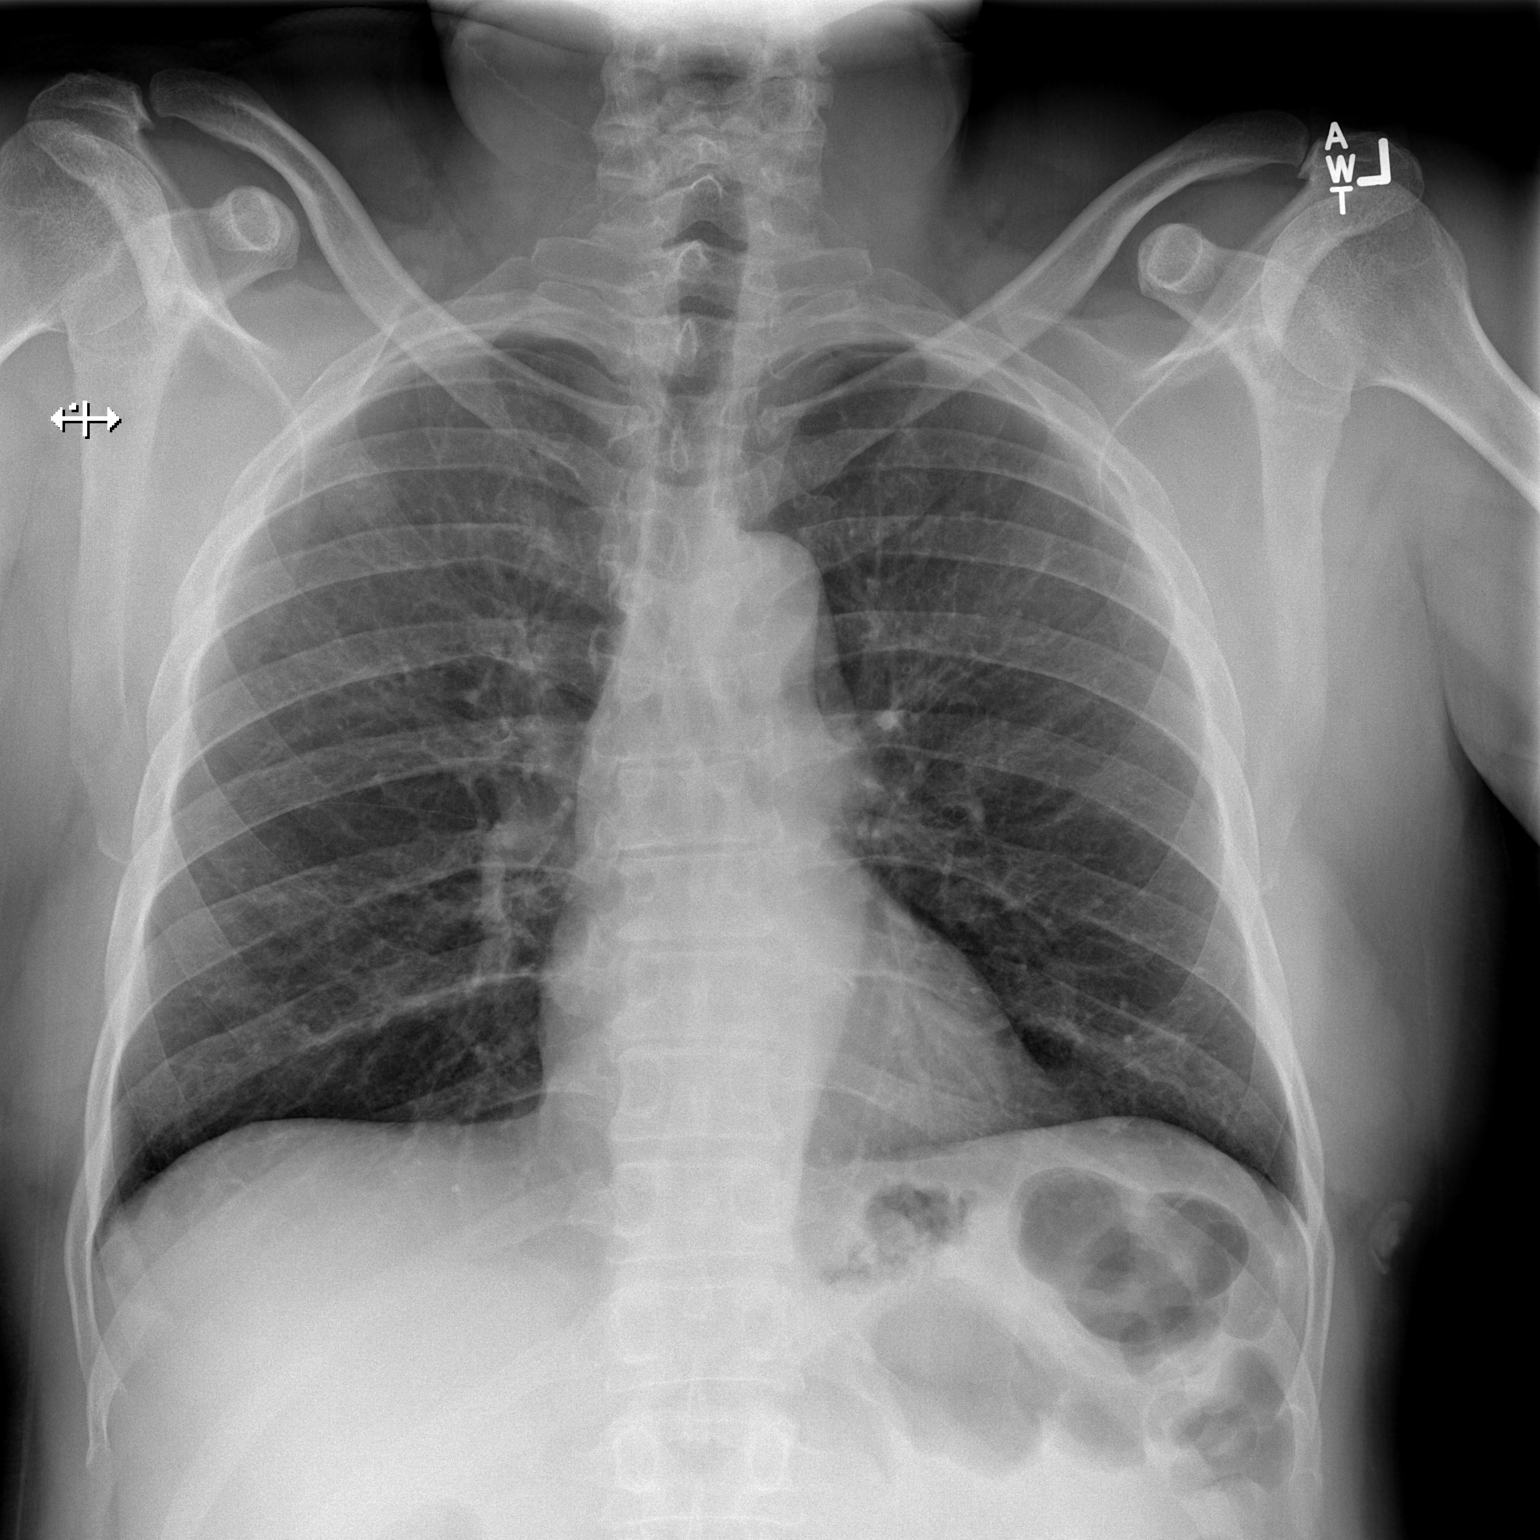

[w chest lat]
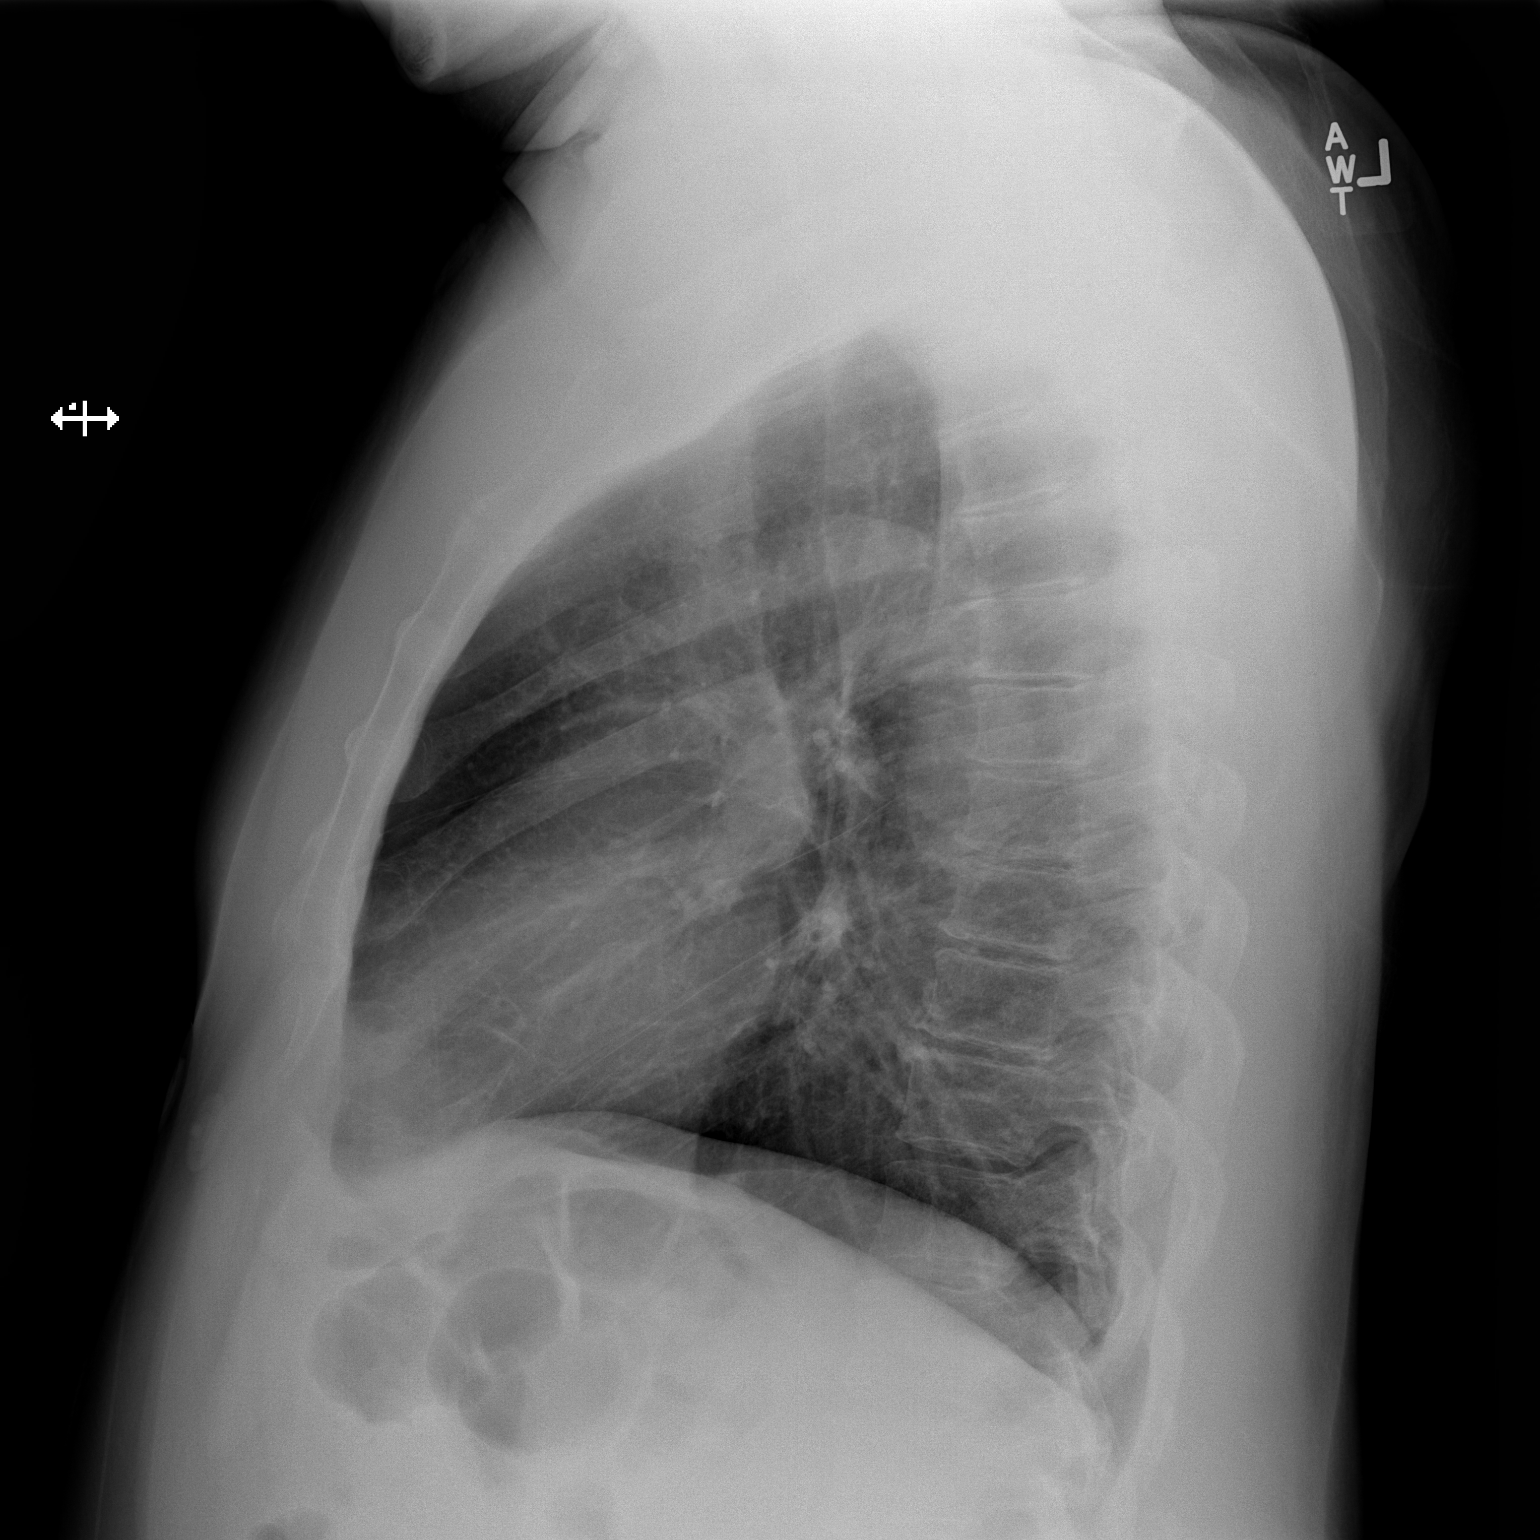

[2 of 2 positions shown; findings below may reference images not displayed]

FINDINGS: The heart size and mediastinal contours are within normal limits.
Both lungs are clear. No pleural effusion or pneumothorax. The
visualized skeletal structures are unremarkable.
IMPRESSION: No active cardiopulmonary disease.

## 2017-06-18 ENCOUNTER — Encounter (HOSPITAL_COMMUNITY): Payer: Self-pay | Admitting: *Deleted

## 2017-06-18 ENCOUNTER — Inpatient Hospital Stay (HOSPITAL_COMMUNITY)
Admission: RE | Admit: 2017-06-18 | Discharge: 2017-06-22 | DRG: 885 | Disposition: A | Discharge status: Home / Self Care | Payer: Non-veteran care | Attending: Psychiatry | Admitting: Psychiatry

## 2017-06-18 DIAGNOSIS — R45851 Suicidal ideations: Secondary | ICD-10-CM | POA: Diagnosis present

## 2017-06-18 DIAGNOSIS — I10 Essential (primary) hypertension: Secondary | ICD-10-CM | POA: Diagnosis present

## 2017-06-18 DIAGNOSIS — Z79899 Other long term (current) drug therapy: Secondary | ICD-10-CM | POA: Diagnosis not present

## 2017-06-18 DIAGNOSIS — E739 Lactose intolerance, unspecified: Secondary | ICD-10-CM | POA: Diagnosis present

## 2017-06-18 DIAGNOSIS — K219 Gastro-esophageal reflux disease without esophagitis: Secondary | ICD-10-CM | POA: Diagnosis present

## 2017-06-18 DIAGNOSIS — F1010 Alcohol abuse, uncomplicated: Secondary | ICD-10-CM | POA: Diagnosis present

## 2017-06-18 DIAGNOSIS — F39 Unspecified mood [affective] disorder: Secondary | ICD-10-CM | POA: Diagnosis not present

## 2017-06-18 DIAGNOSIS — F17200 Nicotine dependence, unspecified, uncomplicated: Secondary | ICD-10-CM | POA: Diagnosis present

## 2017-06-18 DIAGNOSIS — K5900 Constipation, unspecified: Secondary | ICD-10-CM | POA: Diagnosis present

## 2017-06-18 DIAGNOSIS — F17210 Nicotine dependence, cigarettes, uncomplicated: Secondary | ICD-10-CM | POA: Diagnosis not present

## 2017-06-18 DIAGNOSIS — F332 Major depressive disorder, recurrent severe without psychotic features: Principal | ICD-10-CM | POA: Diagnosis present

## 2017-06-18 DIAGNOSIS — F1420 Cocaine dependence, uncomplicated: Secondary | ICD-10-CM | POA: Diagnosis present

## 2017-06-18 DIAGNOSIS — F4310 Post-traumatic stress disorder, unspecified: Secondary | ICD-10-CM | POA: Diagnosis present

## 2017-06-18 DIAGNOSIS — F419 Anxiety disorder, unspecified: Secondary | ICD-10-CM | POA: Diagnosis present

## 2017-06-18 DIAGNOSIS — F329 Major depressive disorder, single episode, unspecified: Secondary | ICD-10-CM | POA: Diagnosis present

## 2017-06-18 DIAGNOSIS — G4700 Insomnia, unspecified: Secondary | ICD-10-CM | POA: Diagnosis present

## 2017-06-18 DIAGNOSIS — F1490 Cocaine use, unspecified, uncomplicated: Secondary | ICD-10-CM | POA: Diagnosis not present

## 2017-06-18 DIAGNOSIS — F1910 Other psychoactive substance abuse, uncomplicated: Secondary | ICD-10-CM | POA: Diagnosis not present

## 2017-06-18 LAB — URINALYSIS, ROUTINE W REFLEX MICROSCOPIC
BACTERIA UA: NONE SEEN
BILIRUBIN URINE: NEGATIVE
Glucose, UA: NEGATIVE mg/dL
KETONES UR: NEGATIVE mg/dL
LEUKOCYTES UA: NEGATIVE
Nitrite: NEGATIVE
PH: 5 (ref 5.0–8.0)
Protein, ur: NEGATIVE mg/dL
SPECIFIC GRAVITY, URINE: 1.023 (ref 1.005–1.030)
SQUAMOUS EPITHELIAL / LPF: NONE SEEN
WBC, UA: NONE SEEN WBC/hpf (ref 0–5)

## 2017-06-18 LAB — COMPREHENSIVE METABOLIC PANEL
ALBUMIN: 3 g/dL — AB (ref 3.5–5.0)
ALK PHOS: 57 U/L (ref 38–126)
ALT: 16 U/L — AB (ref 17–63)
AST: 19 U/L (ref 15–41)
Anion gap: 5 (ref 5–15)
BUN: 13 mg/dL (ref 6–20)
CALCIUM: 8.3 mg/dL — AB (ref 8.9–10.3)
CO2: 26 mmol/L (ref 22–32)
CREATININE: 1.48 mg/dL — AB (ref 0.61–1.24)
Chloride: 111 mmol/L (ref 101–111)
GFR calc non Af Amer: 51 mL/min — ABNORMAL LOW (ref >60)
GFR, EST AFRICAN AMERICAN: 59 mL/min — AB (ref >60)
GLUCOSE: 88 mg/dL (ref 65–99)
Potassium: 3.9 mmol/L (ref 3.5–5.1)
SODIUM: 142 mmol/L (ref 135–145)
Total Bilirubin: 0.2 mg/dL — ABNORMAL LOW (ref 0.3–1.2)
Total Protein: 5.1 g/dL — ABNORMAL LOW (ref 6.5–8.1)

## 2017-06-18 LAB — CBC
HCT: 37.9 % — ABNORMAL LOW (ref 39.0–52.0)
HEMOGLOBIN: 12.5 g/dL — AB (ref 13.0–17.0)
MCH: 25.2 pg — AB (ref 26.0–34.0)
MCHC: 33 g/dL (ref 30.0–36.0)
MCV: 76.3 fL — ABNORMAL LOW (ref 78.0–100.0)
PLATELETS: 396 10*3/uL (ref 150–400)
RBC: 4.97 MIL/uL (ref 4.22–5.81)
RDW: 16.9 % — ABNORMAL HIGH (ref 11.5–15.5)
WBC: 9.8 10*3/uL (ref 4.0–10.5)

## 2017-06-18 LAB — RAPID URINE DRUG SCREEN, HOSP PERFORMED
Amphetamines: NOT DETECTED
BARBITURATES: NOT DETECTED
BENZODIAZEPINES: NOT DETECTED
Cocaine: POSITIVE — AB
Opiates: NOT DETECTED
Tetrahydrocannabinol: NOT DETECTED

## 2017-06-18 LAB — HEMOGLOBIN A1C
Hgb A1c MFr Bld: 6 % — ABNORMAL HIGH (ref 4.8–5.6)
MEAN PLASMA GLUCOSE: 125.5 mg/dL

## 2017-06-18 LAB — TSH: TSH: 1.197 u[IU]/mL (ref 0.350–4.500)

## 2017-06-18 MED ORDER — HYDROXYZINE HCL 50 MG PO TABS
50.0000 mg | ORAL_TABLET | Freq: Three times a day (TID) | ORAL | Status: DC | PRN
  Administered 2017-06-18 – 2017-06-21 (×6): 50 mg via ORAL
  Filled 2017-06-18 (×6): qty 1

## 2017-06-18 MED ORDER — ACETAMINOPHEN 325 MG PO TABS: 650.0000 mg | ORAL_TABLET | Freq: Four times a day (QID) | ORAL | Status: DC | PRN

## 2017-06-18 MED ORDER — MAGNESIUM HYDROXIDE 400 MG/5ML PO SUSP: 30.0000 mL | Freq: Every day | ORAL | Status: DC | PRN

## 2017-06-18 MED ORDER — PANTOPRAZOLE SODIUM 40 MG PO TBEC
40.0000 mg | DELAYED_RELEASE_TABLET | Freq: Every day | ORAL | Status: DC
  Administered 2017-06-18 – 2017-06-22 (×5): 40 mg via ORAL
  Filled 2017-06-18 (×7): qty 1

## 2017-06-18 MED ORDER — ALUM & MAG HYDROXIDE-SIMETH 200-200-20 MG/5ML PO SUSP: 30.0000 mL | ORAL | Status: DC | PRN

## 2017-06-18 MED ORDER — BUPROPION HCL ER (XL) 300 MG PO TB24
300.0000 mg | ORAL_TABLET | Freq: Every day | ORAL | Status: DC
  Administered 2017-06-18 – 2017-06-22 (×5): 300 mg via ORAL
  Filled 2017-06-18 (×7): qty 1

## 2017-06-18 MED ORDER — PANTOPRAZOLE SODIUM 40 MG IV SOLR
40.0000 mg | Freq: Every day | INTRAVENOUS | Status: DC
  Filled 2017-06-18 (×2): qty 40

## 2017-06-18 MED ORDER — TRAZODONE HCL 50 MG PO TABS
50.0000 mg | ORAL_TABLET | Freq: Every evening | ORAL | Status: DC | PRN
  Administered 2017-06-18 – 2017-06-21 (×4): 50 mg via ORAL
  Filled 2017-06-18 (×2): qty 1
  Filled 2017-06-18: qty 3
  Filled 2017-06-18: qty 1

## 2017-06-18 MED ORDER — LISINOPRIL 20 MG PO TABS
20.0000 mg | ORAL_TABLET | Freq: Every day | ORAL | Status: DC
  Administered 2017-06-18 – 2017-06-22 (×5): 20 mg via ORAL
  Filled 2017-06-18 (×7): qty 1

## 2017-06-18 NOTE — Tx Team (Signed)
Initial Treatment Plan 06/18/2017 1:34 PM Alexander Munoz KGU:[id number]    PATIENT STRESSORS: Loss of relationship Substance abuse   PATIENT STRENGTHS: Ability for insight Capable of independent living Communication skills Motivation for treatment/growth Physical Health Supportive family/friends   PATIENT IDENTIFIED PROBLEMS: Depression  Substance Abuse  "Depression will ease up"  "Get back on the road to recovery"               DISCHARGE CRITERIA:  Ability to meet basic life and health needs Improved stabilization in mood, thinking, and/or behavior Motivation to continue treatment in a less acute level of care Need for constant or close observation no longer present Verbal commitment to aftercare and medication compliance Withdrawal symptoms are absent or subacute and managed without 24-hour nursing intervention  PRELIMINARY DISCHARGE PLAN: Attend 12-step recovery group Outpatient therapy Return to previous living arrangement  PATIENT/FAMILY INVOLVEMENT: This treatment plan has been presented to and reviewed with the patient, Alexander Munoz.  The patient and family have been given the opportunity to ask questions and make suggestions.  Carleene Overlie, RN 06/18/2017, 1:34 PM

## 2017-06-18 NOTE — Progress Notes (Signed)
Patient ID: Alexander Munoz, male   DOB: [birth year], 56 y.o.   MRN: [id number] Per State regulations 482.30 this chart was reviewed for medical necessity with respect to the patient's admission/duration of stay.    Next review date: 06/22/17  [name], BSN, RN-BC  Case Manager
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
## 2017-06-18 NOTE — BHH Group Notes (Signed)
[facility] Mental Health Association Group Therapy 06/18/2017 1:15pm  Type of Therapy: Mental Health Association Presentation  Participation Level: Active  Participation Quality: Attentive  Affect: Appropriate  Cognitive: Oriented  Insight: Developing/Improving  Engagement in Therapy: Engaged  Modes of Intervention: Discussion, Education and Socialization  Summary of Progress/Problems: Mental Health Association (MHA) Speaker came to talk about his personal journey with living with a mental health diagnosis.The pt processed ways by which to relate to the speaker. [name] provided handouts and educational information pertaining to groups and services offered by the [facility]. Pt was engaged in speaker's presentation and was receptive to resources provided.    Vito Backers. Beverely Pace, MSW, [facility] 06/18/2017 3:18 PM

## 2017-06-18 NOTE — Progress Notes (Signed)
Patient ID: Alexander Munoz, male   DOB: 11/23/1960, 56 y.o.   MRN: [id number]  Martina given a urine specimen cup and instructed on getting a sample. Patient verbalized understanding however he has not returned a sample as of yet.

## 2017-06-18 NOTE — Progress Notes (Signed)
Admission Note:  56 year old male who presents voluntary, in no acute distress, for the treatment of SI, Depression, and Substance Abuse. Patient appears flat and depressed. Patient was calm and cooperative with admission process. Patient presents with passive SI with a plan to "drive off the road". Patient contracts for safety upon admission. Patient denies AVH.  Patient identifies stressors as recent breakup with a girlfriend in April 2017 and "I've just been depressed and feeling suicidal. I don't know why".  Patient reports that he relapsed on cocaine "3 weeks ago" after being clean for "4 months".  Patient lives alone and identifies "sponsor and a couple of friends" as his support system.  Patient reports previous suicide attempt in November 2017 via "I overdosed on sleeping pills".  While at [facility], patient would like to work on "I want my depression to ease up" and "Get back on road to recovery".  Skin was assessed and found to be clear of any abnormal marks.  Patient searched and no contraband found, POC and unit policies explained and understanding verbalized. Consents obtained. Food and fluids offered accepted. Patient had no additional questions or concerns.

## 2017-06-19 DIAGNOSIS — F1910 Other psychoactive substance abuse, uncomplicated: Secondary | ICD-10-CM

## 2017-06-19 DIAGNOSIS — R45851 Suicidal ideations: Secondary | ICD-10-CM

## 2017-06-19 DIAGNOSIS — F419 Anxiety disorder, unspecified: Secondary | ICD-10-CM

## 2017-06-19 DIAGNOSIS — G4700 Insomnia, unspecified: Secondary | ICD-10-CM

## 2017-06-19 DIAGNOSIS — F17210 Nicotine dependence, cigarettes, uncomplicated: Secondary | ICD-10-CM

## 2017-06-19 DIAGNOSIS — F332 Major depressive disorder, recurrent severe without psychotic features: Principal | ICD-10-CM

## 2017-06-19 LAB — LIPID PANEL
Cholesterol: 120 mg/dL (ref 0–200)
HDL: 34 mg/dL — AB (ref >40)
LDL CALC: 38 mg/dL (ref 0–99)
Total CHOL/HDL Ratio: 3.5 RATIO
Triglycerides: 242 mg/dL — ABNORMAL HIGH (ref <150)
VLDL: 48 mg/dL — AB (ref 0–40)

## 2017-06-19 NOTE — Progress Notes (Signed)
Patient ID: Alexander Munoz, male   DOB: 19-Oct-1961, 56 y.o.   MRN: [id number] Patient cooperative with care this morning, reports that he has "fair" amount of sleep last night, reports his appetite as being good, his energy level as being low, and concentration level as "good".    Patient verbalizes +SI this morning, denies having a plan, verbally agreeable not to hurt himself on the unit, agrees to let staff know if his thoughts worsen.  Pt denies AVH/HI, denies pain, states that his goal today is to "gain energy", and "stay out of bed".  Patient took all of his AM medications as scheduled, states that he ate all of his breakfast, Q15 minute checks in place, will continue to monitor.
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
## 2017-06-19 NOTE — Plan of Care (Signed)
Problem: Safety: Goal: Periods of time without injury will increase Outcome: Progressing Patient is on q15 minute safety checks and low fall risk precautions. Patient contracts for safety on the unit and remains safe at this time.   
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
## 2017-06-19 NOTE — BHH Suicide Risk Assessment (Signed)
BHH INPATIENT:  Family/Significant Other Suicide Prevention Education  Suicide Prevention Education:  Patient Refusal for Family/Significant Other Suicide Prevention Education: The patient Alexander Munoz has refused to provide written consent for family/significant other to be provided Family/Significant Other Suicide Prevention Education during admission and/or prior to discharge.  Physician notified.  SPE completed with pt, as pt refused to consent to family contact. SPI pamphlet provided to pt and pt was encouraged to share information with support network, ask questions, and talk about any concerns relating to SPE. Pt denies access to guns/firearms and verbalized understanding of information provided. Mobile Crisis information also provided to pt.   Alina Gilkey N Smart LCSW 06/19/2017, 2:44 PM

## 2017-06-19 NOTE — Progress Notes (Signed)
Recreation Therapy Notes  Animal-Assisted Activity (AAA) Program Checklist/Progress Notes Patient Eligibility Criteria Checklist & Daily Group note for Rec TxIntervention  Date: 08.21.2018 Time: 2:45pm Location: [address] Dayroom   AAA/T Program Assumption of Risk Form signed by Patient/ or Parent Legal Guardian Yes  Patient is free of allergies or sever asthma Yes  Patient reports no fear of animals Yes  Patient reports no history of cruelty to animals Yes  Patient understands his/her participation is voluntary Yes  Patient washes hands before animal contact Yes  Patient washes hands after animal contact Yes  Behavioral Response: Appropriate   Education:Hand Washing, Appropriate Animal Interaction   Education Outcome: Acknowledges education.   Clinical Observations/Feedback: Patient attended session and interacted appropriately with therapy dog and peers.    Ashe Graybeal L Shanisha Lech, LRT/CTRS       Knute Mazzuca L 06/19/2017 3:10 PM 

## 2017-06-19 NOTE — Progress Notes (Signed)
Nursing Progress Note 1900-0730  D) Patient presents with depressed mood but is pleasant and cooperative with Clinical research associate. Patient is observed up in the dayroom watching television. Patient denies needs or concerns for writer this evening but does request trazodone and vistaril at bedtime. Patient reports passive SI but denies HI/AVH or pain. Patient contracts for safety on the unit.  A) Emotional support given. 1:1 interaction and active listening provided. Patient medicated as prescribed. Medications and plan of care reviewed with patient. Patient verbalized understanding without further questions. Snacks and fluids provided. Opportunities for questions or concerns presented to patient. Patient encouraged to continue to work on treatment goals. Labs, vital signs and patient behavior monitored throughout shift. Patient safety maintained with q15 min safety checks. Low fall risk precautions in place and reviewed with patient; patient verbalized understanding.  R) Patient receptive to interaction with nurse. Patient remains safe on the unit at this time. Patient denies any adverse medication reactions at this time. Patient is resting in bed without complaints. Will continue to monitor.

## 2017-06-19 NOTE — H&P (Signed)
Psychiatric Admission Assessment Adult  Patient Identification: Alexander Munoz MRN:  [id number] Date of Evaluation:  06/19/2017 Chief Complaint:  MDD PTSD Principal Diagnosis: MDD (major depressive disorder) Diagnosis:   Patient Active Problem List   Diagnosis Date Noted  . MDD (major depressive disorder), recurrent severe, without psychosis ([place]) [F33.2] 09/07/2015    Priority: Medium  . MDD (major depressive disorder) [F32.9] 06/18/2017  . Major depressive disorder, recurrent, severe without psychotic features ([place]) [F33.2]   . Cocaine dependence ([place]) [F14.20] 08/31/2014  . Alcohol abuse [F10.10] 08/31/2014   History of Present Illness: Per [facility] Assessment-Alexander Munoz is a 56 y.o. male who present voluntarily to [facility] as a walk in due to [facility] w/ a plan to drive his car off the road. Pt reports having SI for the past week and not being able to stop thinking about death. 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 Pt reports his prior intent to start an OP group for substance abuse and mood d/o at the [place] this week, but his SI has been so persistent, he felt he needed to come IP. Pt is unable to identify a specific trigger to his current feelings of SI. Pt reports being medication compliant.    ON Evaluation: Alexander Munoz is awake, alert and oriented. Seen resting in dayroom. Patient continues to endorses suicidal ideations reports a recent relapse to drugs and alcohol. Reports he is currently followed by the [place], however it only for medication management. Denies homicidal ideation. Denies auditory or visual hallucination.Alexander Munoz is able to contract for safety. Reports he has NA sponsor.  Patient present with a flat and guarded affect. Reports he is medication complaint with the Wellbutrin. Support, encouragement and reassurance was provided.     Associated Signs/Symptoms: Depression Symptoms:  depressed mood, feelings of worthlessness/guilt, difficulty  concentrating, suicidal thoughts with specific plan, (Hypo) Manic Symptoms:  Distractibility, Irritable Mood, Anxiety Symptoms:  Excessive Worry, Psychotic Symptoms:  Hallucinations: None PTSD Symptoms: Had a traumatic exposure:  military/ PTSD Total Time spent with patient: 30 minutes  Past Psychiatric History:   Is the patient at risk to self? Yes.    Has the patient been a risk to self in the past 6 months? Yes.    Has the patient been a risk to self within the distant past? Yes.    Is the patient a risk to others? No.  Has the patient been a risk to others in the past 6 months? No.  Has the patient been a risk to others within the distant past? No.   Prior Inpatient Therapy: Prior Inpatient Therapy: Yes Prior Therapy Dates: 2006, 2007, 2015, 2016, 2017, 2018 Prior Therapy Facilty/Provider(s): [facility], [place], [facility] Reason for Treatment: SI Prior Outpatient Therapy: Prior Outpatient Therapy: Yes Prior Therapy Facilty/Provider(s): [facility] Reason for Treatment: SI; drug use Does patient have an ACCT team?: No Does patient have Intensive In-House Services?  : No Does patient have [facility] services? : No Does patient have P4CC services?: No  Alcohol Screening: 1. How often do you have a drink containing alcohol?: Monthly or less 2. How many drinks containing alcohol do you have on a typical day when you are drinking?: 1 or 2 3. How often do you have six or more drinks on one occasion?: Never Preliminary Score: 0 9. Have you or someone else been injured as a result of your drinking?: No 10. Has a relative or friend or a doctor or another health worker been concerned about your drinking or  suggested you cut down?: No Alcohol Use Disorder Identification Test Final Score (AUDIT): 1 Brief Intervention: AUDIT score less than 7 or less-screening does not suggest unhealthy drinking-brief intervention not indicated Substance Abuse History in the last 12 months:  Yes.   Consequences of  Substance Abuse: NA Previous Psychotropic Medications: YES Psychological Evaluations: YES Past Medical History:  Past Medical History:  Diagnosis Date  . Anxiety   . Depression   . GERD (gastroesophageal reflux disease)   . Hypertension   . Lactose intolerance   . Migraines    History reviewed. No pertinent surgical history. Family History:  Family History  Problem Relation Age of Onset  . Cancer Other   . Hypertension Other   . Heart attack Other    Family Psychiatric  History:  Tobacco Screening: Have you used any form of tobacco in the last 30 days? (Cigarettes, Smokeless Tobacco, Cigars, and/or Pipes): Yes Tobacco use, Select all that apply: 5 or more cigarettes per day Are you interested in Tobacco Cessation Medications?: No, patient refused Counseled patient on smoking cessation including recognizing danger situations, developing coping skills and basic information about quitting provided: Refused/Declined practical counseling Social History:  History  Alcohol Use  . Yes    Comment: Alcohol 3x's a week      History  Drug Use  . Types: Cocaine    Comment: Daily cocaine use     Additional Social History: Marital status: Single    Pain Medications: see PTA med list Prescriptions: see PTA med list Over the Counter: see PTA med list History of alcohol / drug use?: Yes Longest period of sobriety (when/how long): 4 months in July 2018 Name of Substance 1: Cocaine 1 - Age of First Use: 25 1 - Amount (size/oz): varies 1 - Frequency: twice/week 1 - Duration: ongoing 1 - Last Use / Amount: a gram 2 days ago                  Allergies:   Allergies  Allergen Reactions  . Lactose Intolerance (Gi) Other (See Comments)    Gi upset    Lab Results:  Results for orders placed or performed during the hospital encounter of 06/18/17 (from the past 48 hour(s))  Urinalysis, Routine w reflex microscopic     Status: Abnormal   Collection Time: 06/18/17 11:28 AM   Result Value Ref Range   Color, Urine YELLOW YELLOW   APPearance TURBID (A) CLEAR   Specific Gravity, Urine 1.023 1.005 - 1.030   pH 5.0 5.0 - 8.0   Glucose, UA NEGATIVE NEGATIVE mg/dL   Hgb urine dipstick SMALL (A) NEGATIVE   Bilirubin Urine NEGATIVE NEGATIVE   Ketones, ur NEGATIVE NEGATIVE mg/dL   Protein, ur NEGATIVE NEGATIVE mg/dL   Nitrite NEGATIVE NEGATIVE   Leukocytes, UA NEGATIVE NEGATIVE   RBC / HPF 0-5 0 - 5 RBC/hpf   WBC, UA NONE SEEN 0 - 5 WBC/hpf   Bacteria, UA NONE SEEN NONE SEEN   Squamous Epithelial / LPF NONE SEEN NONE SEEN   Mucus PRESENT     Comment: Performed at [facility], [place] [address] [place], [place] [address]  Urine rapid drug screen (hosp performed)not at [facility]     Status: Abnormal   Collection Time: 06/18/17 11:29 AM  Result Value Ref Range   Opiates NONE DETECTED NONE DETECTED   Cocaine POSITIVE (A) NONE DETECTED   Benzodiazepines NONE DETECTED NONE DETECTED   Amphetamines NONE DETECTED NONE DETECTED   Tetrahydrocannabinol  NONE DETECTED NONE DETECTED   Barbiturates NONE DETECTED NONE DETECTED    Comment:        DRUG SCREEN FOR MEDICAL PURPOSES ONLY.  IF CONFIRMATION IS NEEDED FOR ANY PURPOSE, NOTIFY LAB WITHIN 5 DAYS.        LOWEST DETECTABLE LIMITS FOR URINE DRUG SCREEN Drug Class       Cutoff (ng/mL) Amphetamine      1000 Barbiturate      200 Benzodiazepine   989 Tricyclics       211 Opiates          300 Cocaine          300 THC              50 Performed at [facility], [place] [address], [place], [place] [address]   CBC     Status: Abnormal   Collection Time: 06/18/17  6:52 PM  Result Value Ref Range   WBC 9.8 4.0 - 10.5 K/uL   RBC 4.97 4.22 - 5.81 MIL/uL   Hemoglobin 12.5 (L) 13.0 - 17.0 g/dL   HCT 37.9 (L) 39.0 - 52.0 %   MCV 76.3 (L) 78.0 - 100.0 fL   MCH 25.2 (L) 26.0 - 34.0 pg   MCHC 33.0 30.0 - 36.0 g/dL   RDW 16.9 (H) 11.5 - 15.5 %   Platelets 396 150 - 400 K/uL    Comment:  Performed at [facility], [place] [address], [place], [place] [address]  Comprehensive metabolic panel     Status: Abnormal   Collection Time: 06/18/17  6:52 PM  Result Value Ref Range   Sodium 142 135 - 145 mmol/L   Potassium 3.9 3.5 - 5.1 mmol/L   Chloride 111 101 - 111 mmol/L   CO2 26 22 - 32 mmol/L   Glucose, Bld 88 65 - 99 mg/dL   BUN 13 6 - 20 mg/dL   Creatinine, Ser 1.48 (H) 0.61 - 1.24 mg/dL   Calcium 8.3 (L) 8.9 - 10.3 mg/dL   Total Protein 5.1 (L) 6.5 - 8.1 g/dL   Albumin 3.0 (L) 3.5 - 5.0 g/dL   AST 19 15 - 41 U/L   ALT 16 (L) 17 - 63 U/L   Alkaline Phosphatase 57 38 - 126 U/L   Total Bilirubin 0.2 (L) 0.3 - 1.2 mg/dL   GFR calc non Af Amer 51 (L) >60 mL/min   GFR calc Af Amer 59 (L) >60 mL/min    Comment: (NOTE) The eGFR has been calculated using the CKD EPI equation. This calculation has not been validated in all clinical situations. eGFR's persistently <60 mL/min signify possible Chronic Kidney Disease.    Anion gap 5 5 - 15    Comment: Performed at [facility], [place] [address], [place], [place] [address]  Hemoglobin A1c     Status: Abnormal   Collection Time: 06/18/17  6:52 PM  Result Value Ref Range   Hgb A1c MFr Bld 6.0 (H) 4.8 - 5.6 %    Comment: (NOTE) Pre diabetes:          5.7%-6.4% Diabetes:              >6.4% Glycemic control for   <7.0% adults with diabetes    Mean Plasma Glucose 125.5 mg/dL    Comment: Performed at [place] [address], [place],  [address]  TSH     Status: None   Collection Time: 06/18/17  6:52  PM  Result Value Ref Range   TSH 1.197 0.350 - 4.500 uIU/mL    Comment: Performed by a 3rd Generation assay with a functional sensitivity of <=0.01 uIU/mL. Performed at [facility], [place] [address], [place], [place] [address]   Lipid panel     Status: Abnormal   Collection Time: 06/19/17  6:04 AM  Result Value Ref Range   Cholesterol 120 0 - 200 mg/dL    Triglycerides 242 (H) <150 mg/dL   HDL 34 (L) >40 mg/dL   Total CHOL/HDL Ratio 3.5 RATIO   VLDL 48 (H) 0 - 40 mg/dL   LDL Cholesterol 38 0 - 99 mg/dL    Comment:        Total Cholesterol/HDL:CHD Risk Coronary Heart Disease Risk Table                     Men   Women  1/2 Average Risk   3.4   3.3  Average Risk       5.0   4.4  2 X Average Risk   9.6   7.1  3 X Average Risk  23.4   11.0        Use the calculated Patient Ratio above and the CHD Risk Table to determine the patient's CHD Risk.        ATP III CLASSIFICATION (LDL):  <100     mg/dL   Optimal  100-129  mg/dL   Near or Above                    Optimal  130-159  mg/dL   Borderline  160-189  mg/dL   High  >190     mg/dL   Very High Performed at [place] [address], [place], [place] [address]     Blood Alcohol level:  Lab Results  Component Value Date   [facility] <5 12/22/2016   ETH <5 18/56/3149    Metabolic Disorder Labs:  Lab Results  Component Value Date   HGBA1C 6.0 (H) 06/18/2017   MPG 125.5 06/18/2017   No results found for: PROLACTIN Lab Results  Component Value Date   CHOL 120 06/19/2017   TRIG 242 (H) 06/19/2017   HDL 34 (L) 06/19/2017   CHOLHDL 3.5 06/19/2017   VLDL 48 (H) 06/19/2017   LDLCALC 38 06/19/2017    Current Medications: Current Facility-Administered Medications  Medication Dose Route Frequency Provider Last Rate Last Dose  . acetaminophen (TYLENOL) tablet 650 mg  650 mg Oral Q6H PRN Okonkwo, Justina A, NP      . alum & mag hydroxide-simeth (MAALOX/MYLANTA) 200-200-20 MG/5ML suspension 30 mL  30 mL Oral Q4H PRN Okonkwo, Justina A, NP      . buPROPion (WELLBUTRIN XL) 24 hr tablet 300 mg  300 mg Oral Daily Okonkwo, Justina A, NP   300 mg at 06/19/17 0821  . hydrOXYzine (ATARAX/VISTARIL) tablet 50 mg  50 mg Oral TID PRN Hughie Closs A, NP   50 mg at 06/19/17 7026  . lisinopril (PRINIVIL,ZESTRIL) tablet 20 mg  20 mg Oral Daily Okonkwo, Justina A, NP   20 mg at 06/19/17 0821   . magnesium hydroxide (MILK OF MAGNESIA) suspension 30 mL  30 mL Oral Daily PRN Okonkwo, Justina A, NP      . pantoprazole (PROTONIX) EC tablet 40 mg  40 mg Oral Daily Okonkwo, Justina A, NP   40 mg at 06/19/17 0821  . traZODone (DESYREL) tablet  50 mg  50 mg Oral QHS PRN,MR X 1 Okonkwo, Justina A, NP   50 mg at 06/18/17 2136   PTA Medications: Prescriptions Prior to Admission  Medication Sig Dispense Refill Last Dose  . buPROPion (WELLBUTRIN XL) 150 MG 24 hr tablet Take 150 mg by mouth daily.   06/17/2017 at Unknown time  . hydrochlorothiazide (MICROZIDE) 12.5 MG capsule Take 12.5 mg by mouth daily.   06/17/2017  . hydrOXYzine (ATARAX/VISTARIL) 50 MG tablet Take 50 mg by mouth 3 (three) times daily as needed for anxiety.   06/17/2017 at Unknown time  . lisinopril (PRINIVIL,ZESTRIL) 20 MG tablet Take 20 mg by mouth daily.   06/17/2017 at Unknown time  . Melatonin 3 MG TABS Take 6 mg by mouth at bedtime.   06/17/2017 at Unknown time  . omeprazole (PRILOSEC) 20 MG capsule Take 20 mg by mouth daily.   06/17/2017 at Unknown time  . buPROPion (WELLBUTRIN XL) 300 MG 24 hr tablet Take 1 tablet (300 mg total) by mouth daily. For depression (Patient not taking: Reported on 06/18/2017) 30 tablet 0 Not Taking at Unknown time  . lisinopril (PRINIVIL,ZESTRIL) 10 MG tablet Take 1 tablet (10 mg total) by mouth daily. For high blood pressure (Patient not taking: Reported on 12/22/2016) 30 tablet 0 Not Taking at Unknown time  . nicotine (NICODERM CQ - DOSED IN MG/24 HOURS) 21 mg/24hr patch Place 1 patch (21 mg total) onto the skin daily. For smoking cessation (Patient not taking: Reported on 06/18/2017) 28 patch 0 Not Taking at Unknown time  . pantoprazole (PROTONIX) 40 MG tablet Take 1 tablet (40 mg total) by mouth daily. For acid reflux (Patient not taking: Reported on 06/18/2017) 1 tablet 0 Not Taking at Unknown time  . traZODone (DESYREL) 50 MG tablet Take 1 tablet (50 mg total) by mouth at bedtime as needed for sleep.  (Patient not taking: Reported on 06/18/2017) 30 tablet 0 Not Taking at Unknown time    Musculoskeletal: Strength & Muscle Tone: within normal limits Gait & Station: normal Patient leans: N/A  Psychiatric Specialty Exam: Physical Exam  Vitals reviewed. Constitutional: He is oriented to person, place, and time.  Cardiovascular: Normal rate.   Neurological: He is alert and oriented to person, place, and time.  Skin: Skin is warm and dry.    Review of Systems  Psychiatric/Behavioral: Positive for depression, substance abuse and suicidal ideas. The patient is nervous/anxious.     Blood pressure 138/82, pulse 66, temperature 99 F (37.2 C), temperature source Oral, resp. rate 16, height '5\' 8"'  (1.727 m), weight 111.6 kg (246 lb), SpO2 100 %.Body mass index is 37.4 kg/m.  General Appearance: Casual and Guarded  Eye Contact:  Fair  Speech:  Clear and Coherent  Volume:  Decreased  Mood:  Anxious and Depressed  Affect:  Depressed and Flat  Thought Process:  Coherent  Orientation:  Full (Time, Place, and Person)  Thought Content:  Hallucinations: None and Rumination  Suicidal Thoughts:  Yes.  with intent/plan  Homicidal Thoughts:  No  Memory:  Immediate;   Fair Recent;   Fair Remote;   Fair  Judgement:  Fair  Insight:  Present  Psychomotor Activity:  Normal  Concentration:  Concentration: Fair  Recall:  [employer] of Knowledge:  Fair  Language:  Fair  Akathisia:  No  Handed:  Right  AIMS (if indicated):     Assets:  Communication Skills Desire for Improvement Resilience Social Support  ADL's:  Intact  Cognition:  WNL  Sleep:  Number of Hours: 6.75     I agree with current treatment plan on 06/19/2017, Patient seen face-to-face for psychiatric evaluation follow-up, chart reviewed and case discussed with the MD Dwyane Dee. Reviewed the information documented and agree with the treatment plan.  Treatment Plan Summary: Daily contact with patient to assess and evaluate symptoms and  progress in treatment and Medication management   Continue with Wellbutrin 300 mg  for mood stabilization. Continue with Trazodone 50 mg for insomnia  Will continue to monitor vitals ,medication compliance and treatment side effects while patient is here.  Reviewed labs:,BAL -, UDS - positive for cocaine CSW will start working on disposition.  Patient to participate in therapeutic milieu  Observation Level/Precautions:  15 minute checks  Laboratory:  CBC Chemistry Profile Folic Acid HbAIC UA  Psychotherapy:  Individual and group session  Medications:  See above ( increased Wellbutrin 150 mg to 300 mg)  Consultations:  Psychiatry  Discharge Concerns:  Safety, stabilization, and risk of access to medication and medication stabilization   Estimated LOS: 5-7days  Other:     Physician Treatment Plan for Primary Diagnosis: MDD (major depressive disorder) Long Term Goal(s): Improvement in symptoms so as ready for discharge  Short Term Goals: Ability to identify changes in lifestyle to reduce recurrence of condition will improve, Ability to verbalize feelings will improve, Ability to maintain clinical measurements within normal limits will improve and Compliance with prescribed medications will improve  Physician Treatment Plan for Secondary Diagnosis: Principal Problem:   MDD (major depressive disorder)  Long Term Goal(s): Improvement in symptoms so as ready for discharge  Short Term Goals: Ability to verbalize feelings will improve, Ability to disclose and discuss suicidal ideas, Compliance with prescribed medications will improve and Ability to identify triggers associated with substance abuse/mental health issues will improve  I certify that inpatient services furnished can reasonably be expected to improve the patient's condition.    Derrill Center, NP 8/21/20189:32 AM

## 2017-06-19 NOTE — Progress Notes (Signed)
Nursing Progress Note [id number]  D) Patient presents with flat affect and depressed mood. Patient requests medications for sleep and anxiety. Patient is pleasant and cooperative with writer this evening. Patient reports passive SI but denies HI/AVH or pain. Patient contracts for safety on the unit. Patient adjusting to the unit with no questions or concerns for writer.  A) Emotional support given. 1:1 interaction and active listening provided. Patient medicated as prescribed. Medications and plan of care reviewed with patient. Patient verbalized understanding without further questions. Snacks and fluids provided. Opportunities for questions or concerns presented to patient. Patient encouraged to continue to work on treatment goals. Labs, vital signs and patient behavior monitored throughout shift. Patient safety maintained with q15 min safety checks. Low fall risk precautions in place and reviewed with patient; patient verbalized understanding.  R) Patient receptive to interaction with nurse. Patient remains safe on the unit at this time. Patient denies any adverse medication reactions at this time. Patient is resting in bed without complaints. Will continue to monitor.

## 2017-06-19 NOTE — [facility] Group Notes (Signed)
[facility] LCSW Group Therapy  06/19/2017 3:48 PM  Type of Therapy:  Group Therapy  Participation Level:  Did Not Attend-pt chose to remain in bed to rest.    Summary of Progress/Problems: [name] came to talk about his personal journey with substance abuse and addiction. The pt processed ways by which to relate to the speaker. [name] provided handouts and educational information pertaining to groups and services offered by the [facility].   Arelene Moroni N Smart LCSW 06/19/2017, 3:48 PM

## 2017-06-20 DIAGNOSIS — F39 Unspecified mood [affective] disorder: Secondary | ICD-10-CM

## 2017-06-20 DIAGNOSIS — K5900 Constipation, unspecified: Secondary | ICD-10-CM

## 2017-06-20 DIAGNOSIS — F1490 Cocaine use, unspecified, uncomplicated: Secondary | ICD-10-CM

## 2017-06-20 MED ORDER — DOCUSATE SODIUM 100 MG PO CAPS
100.0000 mg | ORAL_CAPSULE | Freq: Two times a day (BID) | ORAL | Status: DC
  Administered 2017-06-20 – 2017-06-22 (×4): 100 mg via ORAL
  Filled 2017-06-20 (×6): qty 1

## 2017-06-20 MED ORDER — POLYETHYLENE GLYCOL 3350 17 G PO PACK
17.0000 g | PACK | Freq: Every day | ORAL | Status: DC
  Administered 2017-06-20: 17 g via ORAL
  Filled 2017-06-20 (×2): qty 1

## 2017-06-20 NOTE — Progress Notes (Signed)
Adult Psychoeducational Group Note  Date:  06/20/2017 Time:  12:06 AM  Group Topic/Focus:  Wrap-Up Group:   The focus of this group is to help patients review their daily goal of treatment and discuss progress on daily workbooks.  Participation Level:  Did Not Attend  Additional Comments:  Pt did not attend wrap-up group  Berlin Hun 06/20/2017, 12:06 AM

## 2017-06-20 NOTE — Tx Team (Signed)
Interdisciplinary Treatment and Diagnostic Plan Update 06/20/2017 Time of Session: 9:30am  Alexander Munoz  MRN: [id number]  Principal Diagnosis: MDD (major depressive disorder), recurrent severe, without psychosis (HCC)  Secondary Diagnoses: Principal Problem:   MDD (major depressive disorder), recurrent severe, without psychosis (HCC) Active Problems:   Cocaine dependence (HCC)   Alcohol abuse   Current Medications:  Current Facility-Administered Medications  Medication Dose Route Frequency Provider Last Rate Last Dose  . acetaminophen (TYLENOL) tablet 650 mg  650 mg Oral Q6H PRN Okonkwo, Justina A, NP      . alum & mag hydroxide-simeth (MAALOX/MYLANTA) 200-200-20 MG/5ML suspension 30 mL  30 mL Oral Q4H PRN Okonkwo, Justina A, NP      . buPROPion (WELLBUTRIN XL) 24 hr tablet 300 mg  300 mg Oral Daily Okonkwo, Justina A, NP   300 mg at 06/20/17 0859  . docusate sodium (COLACE) capsule 100 mg  100 mg Oral BID Money, Gerlene Burdock, FNP      . hydrOXYzine (ATARAX/VISTARIL) tablet 50 mg  50 mg Oral TID PRN Beryle Lathe, Justina A, NP   50 mg at 06/19/17 2225  . lisinopril (PRINIVIL,ZESTRIL) tablet 20 mg  20 mg Oral Daily Okonkwo, Justina A, NP   20 mg at 06/20/17 0859  . magnesium hydroxide (MILK OF MAGNESIA) suspension 30 mL  30 mL Oral Daily PRN Okonkwo, Justina A, NP      . pantoprazole (PROTONIX) EC tablet 40 mg  40 mg Oral Daily Okonkwo, Justina A, NP   40 mg at 06/20/17 0859  . polyethylene glycol (MIRALAX / GLYCOLAX) packet 17 g  17 g Oral Daily Money, Gerlene Burdock, FNP   17 g at 06/20/17 1512  . traZODone (DESYREL) tablet 50 mg  50 mg Oral QHS PRN,MR X 1 Okonkwo, Justina A, NP   50 mg at 06/19/17 2225    PTA Medications: Prescriptions Prior to Admission  Medication Sig Dispense Refill Last Dose  . buPROPion (WELLBUTRIN XL) 150 MG 24 hr tablet Take 150 mg by mouth daily.   06/17/2017 at Unknown time  . hydrochlorothiazide (MICROZIDE) 12.5 MG capsule Take 12.5 mg by mouth daily.   06/17/2017  .  hydrOXYzine (ATARAX/VISTARIL) 50 MG tablet Take 50 mg by mouth 3 (three) times daily as needed for anxiety.   06/17/2017 at Unknown time  . lisinopril (PRINIVIL,ZESTRIL) 20 MG tablet Take 20 mg by mouth daily.   06/17/2017 at Unknown time  . Melatonin 3 MG TABS Take 6 mg by mouth at bedtime.   06/17/2017 at Unknown time  . omeprazole (PRILOSEC) 20 MG capsule Take 20 mg by mouth daily.   06/17/2017 at Unknown time  . buPROPion (WELLBUTRIN XL) 300 MG 24 hr tablet Take 1 tablet (300 mg total) by mouth daily. For depression (Patient not taking: Reported on 06/18/2017) 30 tablet 0 Not Taking at Unknown time  . lisinopril (PRINIVIL,ZESTRIL) 10 MG tablet Take 1 tablet (10 mg total) by mouth daily. For high blood pressure (Patient not taking: Reported on 12/22/2016) 30 tablet 0 Not Taking at Unknown time  . nicotine (NICODERM CQ - DOSED IN MG/24 HOURS) 21 mg/24hr patch Place 1 patch (21 mg total) onto the skin daily. For smoking cessation (Patient not taking: Reported on 06/18/2017) 28 patch 0 Not Taking at Unknown time  . pantoprazole (PROTONIX) 40 MG tablet Take 1 tablet (40 mg total) by mouth daily. For acid reflux (Patient not taking: Reported on 06/18/2017) 1 tablet 0 Not Taking at Unknown time  . traZODone (DESYREL) 50  MG tablet Take 1 tablet (50 mg total) by mouth at bedtime as needed for sleep. (Patient not taking: Reported on 06/18/2017) 30 tablet 0 Not Taking at Unknown time    Treatment Modalities: Medication Management, Group therapy, Case management,  1 to 1 session with clinician, Psychoeducation, Recreational therapy.  Patient Stressors: Loss of relationship Substance abuse Patient Strengths: Ability for insight Capable of independent living Communication skills Motivation for treatment/growth Physical Health Supportive family/friends  Physician Treatment Plan for Primary Diagnosis: MDD (major depressive disorder), recurrent severe, without psychosis (HCC) Long Term Goal(s): Improvement in  symptoms so as ready for discharge Short Term Goals: Ability to identify changes in lifestyle to reduce recurrence of condition will improve Ability to verbalize feelings will improve Ability to maintain clinical measurements within normal limits will improve Compliance with prescribed medications will improve Ability to verbalize feelings will improve Ability to disclose and discuss suicidal ideas Compliance with prescribed medications will improve Ability to identify triggers associated with substance abuse/mental health issues will improve  Medication Management: Evaluate patient's response, side effects, and tolerance of medication regimen.  Therapeutic Interventions: 1 to 1 sessions, Unit Group sessions and Medication administration.  Evaluation of Outcomes: Progressing  Physician Treatment Plan for Secondary Diagnosis: Principal Problem:   MDD (major depressive disorder), recurrent severe, without psychosis (HCC) Active Problems:   Cocaine dependence (HCC)   Alcohol abuse  Long Term Goal(s): Improvement in symptoms so as ready for discharge  Short Term Goals: Ability to identify changes in lifestyle to reduce recurrence of condition will improve Ability to verbalize feelings will improve Ability to maintain clinical measurements within normal limits will improve Compliance with prescribed medications will improve Ability to verbalize feelings will improve Ability to disclose and discuss suicidal ideas Compliance with prescribed medications will improve Ability to identify triggers associated with substance abuse/mental health issues will improve  Medication Management: Evaluate patient's response, side effects, and tolerance of medication regimen.  Therapeutic Interventions: 1 to 1 sessions, Unit Group sessions and Medication administration.  Evaluation of Outcomes: Progressing  RN Treatment Plan for Primary Diagnosis: MDD (major depressive disorder), recurrent severe,  without psychosis (HCC) Long Term Goal(s): Knowledge of disease and therapeutic regimen to maintain health will improve  Short Term Goals: Ability to identify and develop effective coping behaviors will improve and Compliance with prescribed medications will improve  Medication Management: RN will administer medications as ordered by provider, will assess and evaluate patient's response and provide education to patient for prescribed medication. RN will report any adverse and/or side effects to prescribing provider.  Therapeutic Interventions: 1 on 1 counseling sessions, Psychoeducation, Medication administration, Evaluate responses to treatment, Monitor vital signs and CBGs as ordered, Perform/monitor CIWA, COWS, AIMS and Fall Risk screenings as ordered, Perform wound care treatments as ordered.  Evaluation of Outcomes: Progressing  LCSW Treatment Plan for Primary Diagnosis: MDD (major depressive disorder), recurrent severe, without psychosis (HCC) Long Term Goal(s): Safe transition to appropriate next level of care at discharge, Engage patient in therapeutic group addressing interpersonal concerns. Short Term Goals: Engage patient in aftercare planning with referrals and resources, Facilitate patient progression through stages of change regarding substance use diagnoses and concerns, Identify triggers associated with mental health/substance abuse issues and Increase skills for wellness and recovery  Therapeutic Interventions: Assess for all discharge needs, 1 to 1 time with Social worker, Explore available resources and support systems, Assess for adequacy in community support network, Educate family and significant other(s) on suicide prevention, Complete Psychosocial Assessment, Interpersonal group therapy.  Evaluation of Outcomes: Progressing  Progress in Treatment: Attending groups: Yes Participating in groups: Yes Taking medication as prescribed: Yes, MD continues to assess for  medication changes as needed Toleration medication: Yes, no side effects reported at this time Family/Significant other contact made: No, pt declined contact. Patient understands diagnosis: Yes, AEB pt's willingness to participate in treatment. Discussing patient identified problems/goals with staff: Yes Medical problems stabilized or resolved: Yes Denies suicidal/homicidal ideation: Yes Issues/concerns per patient self-inventory: None Other: N/A  New problem(s) identified: None identified at this time.   New Short Term/Long Term Goal(s): None identified at this time.   Discharge Plan or Barriers: Pt will return home and follow up with the [place].  Reason for Continuation of Hospitalization:  Anxiety  Depression Medication stabilization Suicidal ideation Withdrawal symptoms  Estimated Length of Stay: 1-3 days; Estimated discharge date 06/23/17  Attendees: Patient: 06/20/2017 4:21 PM  Physician: Dr. Lucianne Muss; Dr. Elna Breslow 06/20/2017 4:21 PM  Nursing: Rodell Perna, RN; [place], RN 06/20/2017 4:21 PM  RN Care Manager: Onnie Boer, RN 06/20/2017 4:21 PM  Social Worker: Donnelly Stager, LCSWA 06/20/2017 4:21 PM  Recreational Therapist:  06/20/2017 4:21 PM  Other: Armandina Stammer, NP 06/20/2017 4:21 PM  Other:  06/20/2017 4:21 PM  Other: 06/20/2017 4:21 PM  Scribe for Treatment Team: Jonathon Jordan, MSW,LCSWA 06/20/2017 4:21 PM

## 2017-06-20 NOTE — Progress Notes (Signed)
[facility] MD Progress Note  06/20/2017 3:04 PM Alexander Munoz  MRN:  [id number]   Subjective:  Patient reports that he still has some passive SI, but his depression is improving and he is feeling some better. He contracts for safety on the unit as well. He reports that his relapse definitely played a huge factor in his depression and his SI. His biggest complaint is lack of BM in the last 3 days and asks for some medication to help have a BM.   Objective: Patient is very pleasant and has appropriate affect. Will prescribe Colace and Miralax for his constipation. Patient has plans to discharge on Friday and has a follow up planned for Monday with the [place]. Will continue his current Wellbutrin and Trazodone.  Principal Problem: MDD (major depressive disorder), recurrent severe, without psychosis ([place]) Diagnosis:   Patient Active Problem List   Diagnosis Date Noted  . PTSD (post-traumatic stress disorder) [F43.10] 12/23/2016  . MDD (major depressive disorder), recurrent severe, without psychosis ([place]) [F33.2] 09/07/2015  . Cocaine dependence ([place]) [F14.20] 08/31/2014  . Alcohol abuse [F10.10] 08/31/2014   Total Time spent with patient: 25 minutes  Past Psychiatric History: See H&P  Past Medical History:  Past Medical History:  Diagnosis Date  . Anxiety   . Depression   . GERD (gastroesophageal reflux disease)   . Hypertension   . Lactose intolerance   . Migraines    History reviewed. No pertinent surgical history. Family History:  Family History  Problem Relation Age of Onset  . Cancer Other   . Hypertension Other   . Heart attack Other    Family Psychiatric  History: See H&P Social History:  History  Alcohol Use  . Yes    Comment: Alcohol 3x's a week      History  Drug Use  . Types: Cocaine    Comment: Daily cocaine use     Social History   Social History  . Marital status: Legally Separated    Spouse name: N/A  . Number of children: N/A  . Years of education: N/A    Social History Main Topics  . Smoking status: Current Every Day Smoker    Packs/day: 1.00    Years: 5.00    Types: Cigarettes  . Smokeless tobacco: Never Used  . Alcohol use Yes     Comment: Alcohol 3x's a week   . Drug use: Yes    Types: Cocaine     Comment: Daily cocaine use   . Sexual activity: Yes    Birth control/ protection: None   Other Topics Concern  . None   Social History Narrative  . None   Additional Social History:    Pain Medications: see PTA med list Prescriptions: see PTA med list Over the Counter: see PTA med list History of alcohol / drug use?: Yes Longest period of sobriety (when/how long): 4 months in July 2018 Name of Substance 1: Cocaine 1 - Age of First Use: 25 1 - Amount (size/oz): varies 1 - Frequency: twice/week 1 - Duration: ongoing 1 - Last Use / Amount: a gram 2 days ago                  Sleep: Good  Appetite:  Good  Current Medications: Current Facility-Administered Medications  Medication Dose Route Frequency Provider Last Rate Last Dose  . acetaminophen (TYLENOL) tablet 650 mg  650 mg Oral Q6H PRN Okonkwo, Justina A, NP      . alum & mag hydroxide-simeth (  MAALOX/MYLANTA) 200-200-20 MG/5ML suspension 30 mL  30 mL Oral Q4H PRN Okonkwo, Justina A, NP      . buPROPion (WELLBUTRIN XL) 24 hr tablet 300 mg  300 mg Oral Daily Okonkwo, Justina A, NP   300 mg at 06/20/17 0859  . docusate sodium (COLACE) capsule 100 mg  100 mg Oral BID Carlyon Nolasco, Lowry Ram, FNP      . hydrOXYzine (ATARAX/VISTARIL) tablet 50 mg  50 mg Oral TID PRN Lu Duffel, Justina A, NP   50 mg at 06/19/17 2225  . lisinopril (PRINIVIL,ZESTRIL) tablet 20 mg  20 mg Oral Daily Okonkwo, Justina A, NP   20 mg at 06/20/17 0859  . magnesium hydroxide (MILK OF MAGNESIA) suspension 30 mL  30 mL Oral Daily PRN Okonkwo, Justina A, NP      . pantoprazole (PROTONIX) EC tablet 40 mg  40 mg Oral Daily Okonkwo, Justina A, NP   40 mg at 06/20/17 0859  . polyethylene glycol (MIRALAX /  GLYCOLAX) packet 17 g  17 g Oral Daily Montavious Wierzba, Darnelle Maffucci B, FNP      . traZODone (DESYREL) tablet 50 mg  50 mg Oral QHS PRN,MR X 1 Okonkwo, Justina A, NP   50 mg at 06/19/17 2225    Lab Results:  Results for orders placed or performed during the hospital encounter of 06/18/17 (from the past 48 hour(s))  CBC     Status: Abnormal   Collection Time: 06/18/17  6:52 PM  Result Value Ref Range   WBC 9.8 4.0 - 10.5 K/uL   RBC 4.97 4.22 - 5.81 MIL/uL   Hemoglobin 12.5 (L) 13.0 - 17.0 g/dL   HCT 37.9 (L) 39.0 - 52.0 %   MCV 76.3 (L) 78.0 - 100.0 fL   MCH 25.2 (L) 26.0 - 34.0 pg   MCHC 33.0 30.0 - 36.0 g/dL   RDW 16.9 (H) 11.5 - 15.5 %   Platelets 396 150 - 400 K/uL    Comment: Performed at [facility], [place] [address], [place], [place] [address]  Comprehensive metabolic panel     Status: Abnormal   Collection Time: 06/18/17  6:52 PM  Result Value Ref Range   Sodium 142 135 - 145 mmol/L   Potassium 3.9 3.5 - 5.1 mmol/L   Chloride 111 101 - 111 mmol/L   CO2 26 22 - 32 mmol/L   Glucose, Bld 88 65 - 99 mg/dL   BUN 13 6 - 20 mg/dL   Creatinine, Ser 1.48 (H) 0.61 - 1.24 mg/dL   Calcium 8.3 (L) 8.9 - 10.3 mg/dL   Total Protein 5.1 (L) 6.5 - 8.1 g/dL   Albumin 3.0 (L) 3.5 - 5.0 g/dL   AST 19 15 - 41 U/L   ALT 16 (L) 17 - 63 U/L   Alkaline Phosphatase 57 38 - 126 U/L   Total Bilirubin 0.2 (L) 0.3 - 1.2 mg/dL   GFR calc non Af Amer 51 (L) >60 mL/min   GFR calc Af Amer 59 (L) >60 mL/min    Comment: (NOTE) The eGFR has been calculated using the CKD EPI equation. This calculation has not been validated in all clinical situations. eGFR's persistently <60 mL/min signify possible Chronic Kidney Disease.    Anion gap 5 5 - 15    Comment: Performed at [facility], [place] [address] [place], [place] [address]  Hemoglobin A1c     Status: Abnormal   Collection Time: 06/18/17  6:52 PM  Result Value Ref Range  Hgb A1c MFr Bld 6.0 (H) 4.8 - 5.6 %    Comment:  (NOTE) Pre diabetes:          5.7%-6.4% Diabetes:              >6.4% Glycemic control for   <7.0% adults with diabetes    Mean Plasma Glucose 125.5 mg/dL    Comment: Performed at [facility], [place] [address] [place], [place] [address]  TSH     Status: None   Collection Time: 06/18/17  6:52 PM  Result Value Ref Range   TSH 1.197 0.350 - 4.500 uIU/mL    Comment: Performed by a 3rd Generation assay with a functional sensitivity of <=0.01 uIU/mL. Performed at [facility], [place] [address] [place], [place] [address]   Lipid panel     Status: Abnormal   Collection Time: 06/19/17  6:04 AM  Result Value Ref Range   Cholesterol 120 0 - 200 mg/dL   Triglycerides 242 (H) <150 mg/dL   HDL 34 (L) >40 mg/dL   Total CHOL/HDL Ratio 3.5 RATIO   VLDL 48 (H) 0 - 40 mg/dL   LDL Cholesterol 38 0 - 99 mg/dL    Comment:        Total Cholesterol/HDL:CHD Risk Coronary Heart Disease Risk Table                     Men   Women  1/2 Average Risk   3.4   3.3  Average Risk       5.0   4.4  2 X Average Risk   9.6   7.1  3 X Average Risk  23.4   11.0        Use the calculated Patient Ratio above and the CHD Risk Table to determine the patient's CHD Risk.        ATP III CLASSIFICATION (LDL):  <100     mg/dL   Optimal  100-129  mg/dL   Near or Above                    Optimal  130-159  mg/dL   Borderline  160-189  mg/dL   High  >190     mg/dL   Very High Performed at [place] [address] [place],  [address]     Blood Alcohol level:  Lab Results  Component Value Date   [facility] <5 12/22/2016   ETH <5 36/62/9476    Metabolic Disorder Labs: Lab Results  Component Value Date   HGBA1C 6.0 (H) 06/18/2017   MPG 125.5 06/18/2017   No results found for: PROLACTIN Lab Results  Component Value Date   CHOL 120 06/19/2017   TRIG 242 (H) 06/19/2017   HDL 34 (L) 06/19/2017   CHOLHDL 3.5 06/19/2017   VLDL 48 (H) 06/19/2017   LDLCALC 38 06/19/2017     Physical Findings: AIMS: Facial and Oral Movements Muscles of Facial Expression: None, normal Lips and Perioral Area: None, normal Jaw: None, normal Tongue: None, normal,Extremity Movements Upper (arms, wrists, hands, fingers): None, normal Lower (legs, knees, ankles, toes): None, normal, Trunk Movements Neck, shoulders, hips: None, normal, Overall Severity Severity of abnormal movements (highest score from questions above): None, normal Incapacitation due to abnormal movements: None, normal Patient's awareness of abnormal movements (rate only patient's report): No Awareness, Dental Status Current problems with teeth and/or dentures?: No Does patient usually wear dentures?: No  CIWA:  COWS:     Musculoskeletal: Strength & Muscle Tone: within normal limits Gait & Station: normal Patient leans: N/A  Psychiatric Specialty Exam: Physical Exam  Nursing note and vitals reviewed. Constitutional: He is oriented to person, place, and time. He appears well-developed and well-nourished.  Cardiovascular: Normal rate.   Respiratory: Effort normal.  Musculoskeletal: Normal range of motion.  Neurological: He is alert and oriented to person, place, and time.  Skin: Skin is warm.    Review of Systems  Constitutional: Negative.   HENT: Negative.   Eyes: Negative.   Respiratory: Negative.   Cardiovascular: Negative.   Gastrointestinal: Positive for constipation.  Genitourinary: Negative.   Musculoskeletal: Negative.   Skin: Negative.   Neurological: Negative.   Endo/Heme/Allergies: Negative.     Blood pressure 123/81, pulse 78, temperature 99 F (37.2 C), temperature source Oral, resp. rate 18, height _0  (1.727 m), weight 111.6 kg (246 lb), SpO2 100 %.Body mass index is 37.4 kg/m.  General Appearance: Casual  Eye Contact:  Good  Speech:  Clear and Coherent and Normal Rate  Volume:  Normal  Mood:  Depressed  Affect:  Appropriate  Thought Process:  Coherent and  Descriptions of Associations: Intact  Orientation:  Full (Time, Place, and Person)  Thought Content:  WDL  Suicidal Thoughts:  Yes.  without intent/plan  Homicidal Thoughts:  No  Memory:  Immediate;   Good Recent;   Good  Judgement:  Fair  Insight:  Good  Psychomotor Activity:  Normal  Concentration:  Concentration: Good  Recall:  Good  Fund of Knowledge:  Good  Language:  Good  Akathisia:  No  Handed:  Right  AIMS (if indicated):     Assets:  Desire for Improvement Financial Resources/Insurance Housing Social Support Transportation  ADL's:  Intact  Cognition:  WNL  Sleep:  Number of Hours: 6.5     Treatment Plan Summary: Daily contact with patient to assess and evaluate symptoms and progress in treatment, Medication management and Plan is to:  -Continue Wellbutrin XL 300 mg PO Daily for mood stabilization -Continue Trazodone 50 mg PO QHS PRN for insomnia -Continue Vistaril 50 mg PO TID PRN for anxiety -Encourage group therapy participation -Started Furniture conservator/restorer for constipation  Lewis Shock, FNP 06/20/2017, 3:04 PM

## 2017-06-20 NOTE — Progress Notes (Signed)
Patient ID: Alexander Munoz, male   DOB: 1961/01/10, 56 y.o.   MRN: [id number] D:Affect is sad at times,mood is depressed. States that he wants to continue to work on feeling better. Pt has been present in the milieu and has attended groups and activities today. Rates his depression as a six and anxiety as a seven.A:Support and encouragement offered. R:Receptive. No complaints of pain or problems at this time.

## 2017-06-20 NOTE — [facility] Group Notes (Signed)
[facility] LCSW Group Therapy 06/20/2017 1:15pm  Type of Therapy and Topic: Group Therapy: Avoiding Self-Sabotaging and Enabling Behaviors   Participation Level: Active  Description of Group:  Learn how to identify obstacles, self-sabotaging and enabling behaviors, what are they, why do we do them and what needs do these behaviors meet? Discuss unhealthy relationships and how to have positive healthy boundaries with those that sabotage and enable. Explore aspects of self-sabotage and enabling in yourself and how to limit these self-destructive behaviors in everyday life. A scaling question is used to help patient look at where they are now in their motivation to change, from 1 to 10 (lowest to highest motivation).   Therapeutic Goals:  1. Patient will identify one obstacle that relates to self-sabotage and enabling behaviors 2. Patient will identify one personal self-sabotaging or enabling behavior they did prior to admission 3. Patient able to establish a plan to change the above identified behavior they did prior to admission:  4. Patient will demonstrate ability to communicate their needs through discussion and/or role plays.   Therapeutic Modalities:  Cognitive Behavioral Therapy  Person-Centered Therapy  Motivational Interviewing   Donnelly Stager, MSW, [facility] 06/20/2017 4:07 PM

## 2017-06-20 NOTE — Plan of Care (Signed)
Problem: Health Behavior/Discharge Planning: Goal: Identification of resources available to assist in meeting health care needs will improve Outcome: Progressing Patient reports he plans to continue care at the [facility] outpatient.

## 2017-06-20 NOTE — Progress Notes (Addendum)
Nursing Progress Note [id number]  D) Patient presents with animated affect and pleasant mood. Patient is observed smiling and tells writer, "I am feeling much better". Patient reports his goal is to "discharge on Friday and start going to the [place] out-patient". Patient reports, "I want to volunteer there to help fill up my time. I have done it before". Patient currently denies SI/HI/AVH or pain. Patient contracts for safety on the unit. Patient requesting trazodone and vistaril at bedtime. Patient has no concerns for writer this evening.  A) Emotional support given. 1:1 interaction and active listening provided. Patient medicated as prescribed. Medications and plan of care reviewed with patient. Patient verbalized understanding without further questions. Snacks and fluids provided. Opportunities for questions or concerns presented to patient. Patient encouraged to continue to work on treatment goals. Labs, vital signs and patient behavior monitored throughout shift. Patient safety maintained with q15 min safety checks. Low fall risk precautions in place and reviewed with patient; patient verbalized understanding.  R) Patient receptive to interaction with nurse. Patient remains safe on the unit at this time. Patient denies any adverse medication reactions at this time. Patient is resting in bed without complaints. Will continue to monitor.

## 2017-06-21 MED ORDER — POLYETHYLENE GLYCOL 3350 17 G PO PACK: 17.0000 g | PACK | Freq: Every day | ORAL | Status: DC | PRN

## 2017-06-21 NOTE — Progress Notes (Signed)
D-Self inventory completed and goal for today is to work towards his discharge. He arates his feelings of depression today asa 4 out of 10, his anxiety as a 1 and his feelings of hopelessness asa 0. He is able to contract for safety. A-Support offered. Monitored for safety and medications as ordered. R-No behavior issues. [facility] wanting to follow up with his social worker here, but when Clinical research associate tried to contract her she had left for the day. Encouraged to keep his questions for tomorrow when she will return at 830. He is pleasant, no distress voiced. Attending groups as available and spending much of the day in the dayroom with his peers.

## 2017-06-21 NOTE — Progress Notes (Signed)
[facility] Group Notes:  (Nursing/MHT/Case Management/Adjunct)  Date:  06/21/2017  Time:  2030  Type of Therapy:  wrap up group  Participation Level:  Active  Participation Quality:  Appropriate, Attentive, Sharing and Supportive  Affect:  Flat  Cognitive:  Appropriate  Insight:  Improving  Engagement in Group:  Engaged  Modes of Intervention:  Clarification, Education and Support  Summary of Progress/Problems: Pt is looking forward to going home tomorrow. If patient could change one thing about his life it would be his impulsivity when it comes to decision making. 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   Johann Capers S 06/21/2017, 10:01 PM

## 2017-06-21 NOTE — Progress Notes (Signed)
Nursing Progress Note: 7p-7a D: Pt currently presents with a pleasant/animated affect and behavior. Pt states "I had a good day. I am happy. Just ready to go home." Interacting appropriately with the milieu. Pt reports good sleep during the previous night with current medication regimen. Pt did attend wrap-up group.  A: Pt provided with medications per providers orders. Pt's labs and vitals were monitored throughout the night. Pt supported emotionally and encouraged to express concerns and questions. Pt educated on medications.  R: Pt's safety ensured with 15 minute and environmental checks. Pt currently denies SI, HI, and AVH. Pt verbally contracts to seek staff if SI,HI, or AVH occurs and to consult with staff before acting on any harmful thoughts. Will continue to monitor.

## 2017-06-21 NOTE — [facility] Group Notes (Signed)
[facility] LCSW Group Therapy  06/21/2017 3:20 PM  Type of Therapy:  Group Therapy  Participation Level:  Did Not Attend-pt invited. Chose to remain in bed.   Summary of Progress/Problems: Today's Topic: Overcoming Obstacles. Patients identified one short term goal and potential obstacles in reaching this goal. Patients processed barriers involved in overcoming these obstacles. Patients identified steps necessary for overcoming these obstacles and explored motivation (internal and external) for facing these difficulties head on.   Alexander Munoz N Smart LCSW 06/21/2017, 3:20 PM

## 2017-06-21 NOTE — Progress Notes (Signed)
[facility] MD Progress Note  06/21/2017 11:19 AM Alexander Munoz  MRN:  [id number]   Subjective:  Patient reports that he is doing really well today and denies any SI/HI/AVH and will contract for safety. He states that the reason he is requesting discharge tomorrow is due to financial reasons and he has his own vehicle here at the hospital.   Objective: Patieet is pleasant and cooperative. He will be discharged in the morning and will follow up at the [place] on Monday.   Principal Problem: MDD (major depressive disorder), recurrent severe, without psychosis (HCC) Diagnosis:   Patient Active Problem List   Diagnosis Date Noted  . PTSD (post-traumatic stress disorder) [F43.10] 12/23/2016  . MDD (major depressive disorder), recurrent severe, without psychosis (HCC) [F33.2] 09/07/2015  . Cocaine dependence (HCC) [F14.20] 08/31/2014  . Alcohol abuse [F10.10] 08/31/2014   Total Time spent with patient: 15 minutes  Past Psychiatric History: See H&P  Past Medical History:  Past Medical History:  Diagnosis Date  . Anxiety   . Depression   . GERD (gastroesophageal reflux disease)   . Hypertension   . Lactose intolerance   . Migraines    History reviewed. No pertinent surgical history. Family History:  Family History  Problem Relation Age of Onset  . Cancer Other   . Hypertension Other   . Heart attack Other    Family Psychiatric  History: See H&P Social History:  History  Alcohol Use  . Yes    Comment: Alcohol 3x's a week      History  Drug Use  . Types: Cocaine    Comment: Daily cocaine use     Social History   Social History  . Marital status: Legally Separated    Spouse name: N/A  . Number of children: N/A  . Years of education: N/A   Social History Main Topics  . Smoking status: Current Every Day Smoker    Packs/day: 1.00    Years: 5.00    Types: Cigarettes  . Smokeless tobacco: Never Used  . Alcohol use Yes     Comment: Alcohol 3x's a week   . Drug use: Yes    Types:  Cocaine     Comment: Daily cocaine use   . Sexual activity: Yes    Birth control/ protection: None   Other Topics Concern  . None   Social History Narrative  . None   Additional Social History:    Pain Medications: see PTA med list Prescriptions: see PTA med list Over the Counter: see PTA med list History of alcohol / drug use?: Yes Longest period of sobriety (when/how long): 4 months in July 2018 Name of Substance 1: Cocaine 1 - Age of First Use: 25 1 - Amount (size/oz): varies 1 - Frequency: twice/week 1 - Duration: ongoing 1 - Last Use / Amount: a gram 2 days ago       Sleep: Good  Appetite:  Good  Current Medications: Current Facility-Administered Medications  Medication Dose Route Frequency Provider Last Rate Last Dose  . acetaminophen (TYLENOL) tablet 650 mg  650 mg Oral Q6H PRN Okonkwo, Justina A, NP      . alum & mag hydroxide-simeth (MAALOX/MYLANTA) 200-200-20 MG/5ML suspension 30 mL  30 mL Oral Q4H PRN Okonkwo, Justina A, NP      . buPROPion (WELLBUTRIN XL) 24 hr tablet 300 mg  300 mg Oral Daily Okonkwo, Justina A, NP   300 mg at 06/21/17 0806  . docusate sodium (COLACE) capsule 100 mg  100 mg Oral BID Naidelin Gugliotta, Gerlene Burdock, FNP   100 mg at 06/21/17 1610  . hydrOXYzine (ATARAX/VISTARIL) tablet 50 mg  50 mg Oral TID PRN Ferne Reus A, NP   50 mg at 06/21/17 0807  . lisinopril (PRINIVIL,ZESTRIL) tablet 20 mg  20 mg Oral Daily Okonkwo, Justina A, NP   20 mg at 06/21/17 0806  . magnesium hydroxide (MILK OF MAGNESIA) suspension 30 mL  30 mL Oral Daily PRN Okonkwo, Justina A, NP      . pantoprazole (PROTONIX) EC tablet 40 mg  40 mg Oral Daily Okonkwo, Justina A, NP   40 mg at 06/21/17 0806  . polyethylene glycol (MIRALAX / GLYCOLAX) packet 17 g  17 g Oral Daily PRN Norvel Wenker, Feliz Beam B, FNP      . traZODone (DESYREL) tablet 50 mg  50 mg Oral QHS PRN,MR X 1 Okonkwo, Justina A, NP   50 mg at 06/20/17 2131    Lab Results: No results found for this or any previous visit (from  the past 48 hour(s)).  Blood Alcohol level:  Lab Results  Component Value Date   ETH <5 12/22/2016   ETH <5 10/02/2016    Metabolic Disorder Labs: Lab Results  Component Value Date   HGBA1C 6.0 (H) 06/18/2017   MPG 125.5 06/18/2017   No results found for: PROLACTIN Lab Results  Component Value Date   CHOL 120 06/19/2017   TRIG 242 (H) 06/19/2017   HDL 34 (L) 06/19/2017   CHOLHDL 3.5 06/19/2017   VLDL 48 (H) 06/19/2017   LDLCALC 38 06/19/2017    Physical Findings: AIMS: Facial and Oral Movements Muscles of Facial Expression: None, normal Lips and Perioral Area: None, normal Jaw: None, normal Tongue: None, normal,Extremity Movements Upper (arms, wrists, hands, fingers): None, normal Lower (legs, knees, ankles, toes): None, normal, Trunk Movements Neck, shoulders, hips: None, normal, Overall Severity Severity of abnormal movements (highest score from questions above): None, normal Incapacitation due to abnormal movements: None, normal Patient's awareness of abnormal movements (rate only patient's report): No Awareness, Dental Status Current problems with teeth and/or dentures?: No Does patient usually wear dentures?: No  CIWA:    COWS:     Musculoskeletal: Strength & Muscle Tone: within normal limits Gait & Station: normal Patient leans: N/A  Psychiatric Specialty Exam: Physical Exam  Nursing note and vitals reviewed. Constitutional: He is oriented to person, place, and time. He appears well-developed and well-nourished.  Cardiovascular: Normal rate.   Respiratory: Effort normal.  Musculoskeletal: Normal range of motion.  Neurological: He is alert and oriented to person, place, and time.  Skin: Skin is warm.    Review of Systems  Constitutional: Negative.   HENT: Negative.   Eyes: Negative.   Respiratory: Negative.   Cardiovascular: Negative.   Gastrointestinal: Negative.   Genitourinary: Negative.   Musculoskeletal: Negative.   Skin: Negative.    Endo/Heme/Allergies: Negative.     Blood pressure 120/86, pulse 67, temperature 98.7 F (37.1 C), temperature source Oral, resp. rate 20, height 5\' 8"  (1.727 m), weight 111.6 kg (246 lb), SpO2 100 %.Body mass index is 37.4 kg/m.  General Appearance: Casual  Eye Contact:  Good  Speech:  Clear and Coherent and Normal Rate  Volume:  Normal  Mood:  Euthymic  Affect:  Appropriate  Thought Process:  Coherent and Descriptions of Associations: Intact  Orientation:  Full (Time, Place, and Person)  Thought Content:  WDL  Suicidal Thoughts:  No  Homicidal Thoughts:  No  Memory:  Immediate;  Good Recent;   Good  Judgement:  Good  Insight:  Good  Psychomotor Activity:  Normal  Concentration:  Concentration: Good  Recall:  Good  Fund of Knowledge:  Good  Language:  Good  Akathisia:  No  Handed:  Right  AIMS (if indicated):     Assets:  Desire for Improvement Financial Resources/Insurance Housing Social Support Transportation  ADL's:  Intact  Cognition:  WNL  Sleep:  Number of Hours: 6.75     Treatment Plan Summary: Daily contact with patient to assess and evaluate symptoms and progress in treatment, Medication management and Plan is to:  -Continue Wellbutrin XL 300 mg PO Daily for mood stabilization -Continue Trazodone 50 mg PO QHS PRN for insomnia -Continue Vistaril 50 mg PO TID PRN for anxiety -Encourage group therapy participation -Discharge tomorrow morning   Maryfrances Bunnell, FNP 06/21/2017, 11:19 AM

## 2017-06-22 MED ORDER — HYDROXYZINE HCL 50 MG PO TABS: 50.0000 mg | ORAL_TABLET | Freq: Three times a day (TID) | ORAL | 0 refills | Status: DC | PRN

## 2017-06-22 MED ORDER — LISINOPRIL 20 MG PO TABS: 20.0000 mg | ORAL_TABLET | Freq: Every day | ORAL | 0 refills | Status: DC

## 2017-06-22 MED ORDER — PANTOPRAZOLE SODIUM 40 MG PO TBEC: 40.0000 mg | DELAYED_RELEASE_TABLET | Freq: Every day | ORAL | 0 refills | Status: DC

## 2017-06-22 MED ORDER — BUPROPION HCL ER (XL) 300 MG PO TB24: 300.0000 mg | ORAL_TABLET | Freq: Every day | ORAL | 0 refills | Status: DC

## 2017-06-22 MED ORDER — TRAZODONE HCL 50 MG PO TABS: 50.0000 mg | ORAL_TABLET | Freq: Every evening | ORAL | 0 refills | Status: DC | PRN

## 2017-06-22 NOTE — [facility] Suicide Risk Assessment (Signed)
[facility] Discharge Suicide Risk Assessment   Principal Problem: MDD (major depressive disorder), recurrent severe, without psychosis (HCC) Discharge Diagnoses:  Patient Active Problem List   Diagnosis Date Noted  . MDD (major depressive disorder), recurrent severe, without psychosis (HCC) [F33.2] 09/07/2015    Priority: High  . Alcohol abuse [F10.10] 08/31/2014    Priority: High  . Cocaine dependence (HCC) [F14.20] 08/31/2014    Priority: Medium  . PTSD (post-traumatic stress disorder) [F43.10] 12/23/2016   Patient is a 56 year old male admitted due to suicidal ideation with a plan to drive off his car off the road. Patient reported that his depression worsened over the past 4 weeks, as that he started using cocaine 3 weeks ago and then started having suicidal thoughts over the past 2-3 days. He states that he did not want to act on the thoughts, could not keep himself safe and so came into the hospital.  Patient while in the hospital, participated in therapeutic milieu, work on his coping skills, triggers, his history of noncompliance with medications and along with his cocaine use. Patient reports this morning that he is doing fairly well, plans to contact his NA sponsor and attend groups as that has been effective for him in the past. Patient denies any suicidal ideation, any homicidal ideation, any paranoia, hallucinations. On a scale of 0-10 with 0 being no symptoms in 10 being the worst, patient reports his depression is a 2 out of 10. He has that being restarted back on his medications has helped his depression significantly. He currently denies any aggravating factors Total Time spent with patient: 30 minutes  Musculoskeletal: Strength & Muscle Tone: within normal limits Gait & Station: normal Patient leans: N/A  Psychiatric Specialty Exam: Review of Systems  Constitutional: Negative.  Negative for fever and malaise/fatigue.  HENT: Negative.  Negative for congestion and sore throat.    Eyes: Negative.  Negative for blurred vision, double vision, discharge and redness.  Respiratory: Negative.  Negative for cough, shortness of breath and wheezing.   Cardiovascular: Negative.  Negative for chest pain and palpitations.  Gastrointestinal: Negative.  Negative for abdominal pain, constipation, diarrhea, heartburn, nausea and vomiting.  Musculoskeletal: Negative for falls and myalgias.  Neurological: Negative.  Negative for dizziness, seizures, loss of consciousness, weakness and headaches.  Endo/Heme/Allergies: Negative.  Negative for environmental allergies.  Psychiatric/Behavioral: Negative.  Negative for depression, hallucinations, memory loss, substance abuse and suicidal ideas. The patient is not nervous/anxious and does not have insomnia.     Blood pressure 120/86, pulse 67, temperature 98.7 F (37.1 C), temperature source Oral, resp. rate 20, height 5\' 8"  (1.727 m), weight 111.6 kg (246 lb), SpO2 100 %.Body mass index is 37.4 kg/m.  General Appearance: Casual  Eye Contact::  Good  Speech:  Clear and Coherent and Normal Rate409  Volume:  Normal  Mood:  Euthymic  Affect:  Congruent and Full Range  Thought Process:  Coherent, Goal Directed and Descriptions of Associations: Intact  Orientation:  Full (Time, Place, and Person)  Thought Content:  WDL  Suicidal Thoughts:  No  Homicidal Thoughts:  No  Memory:  Immediate;   Fair Recent;   Fair Remote;   Fair  Judgement:  Intact  Insight:  Present  Psychomotor Activity:  Normal  Concentration:  Fair  Recall:  [employer] of Knowledge:Fair  Language: Fair  Akathisia:  No  Handed:  Right  AIMS (if indicated):     Assets:  Desire for Improvement Housing Social Support Transportation  Sleep:  Number of Hours: 6.25  Cognition: WNL  ADL's:  Intact   Mental Status Per Nursing Assessment::   On Admission:  Suicidal ideation indicated by patient, Suicide plan, Plan includes specific time, place, or method, Self-harm  thoughts  Demographic Factors:  Male and Unemployed  Loss Factors: NA  Historical Factors: Family history of mental illness or substance abuse  Risk Reduction Factors:   Religious beliefs about death  Continued Clinical Symptoms:  Alcohol/Substance Abuse/Dependencies More than one psychiatric diagnosis Previous Psychiatric Diagnoses and Treatments  Cognitive Features That Contribute To Risk:  None    Suicide Risk:  Minimal: No identifiable suicidal ideation.  Patients presenting with no risk factors but with morbid ruminations; may be classified as minimal risk based on the severity of the depressive symptoms  Follow-up Information    [facility] Follow up on 06/29/2017.   Why:  Hospital follow-up on Friday at 1:30PM. Thank you.  Contact information: [address] [facility] [place], [place] [address] Phone: [phone] ext [phone] Fax: [phone]          Plan Of Care/Follow-up recommendations:  Activity:  As tolerated Diet:  Regular Other:  Keep follow-up appointments and take medications as prescribed. Patient is also to follow-up with outpatient at the  [facility], MD 06/22/2017, 7:14 AM

## 2017-06-22 NOTE — Discharge Summary (Signed)
Physician Discharge Summary Note  Patient:  Alexander Munoz is an 56 y.o., male MRN:  [id number] DOB:  1961-09-29 Patient phone:  [phone] (home)  Patient address:   [address] [place] [address],  Total Time spent with patient: 30 minutes  Date of Admission:  06/18/2017 Date of Discharge: 06/22/17   Reason for Admission:  Worsening depression with SI with plan  Principal Problem: MDD (major depressive disorder), recurrent severe, without psychosis [facility]) Discharge Diagnoses: Patient Active Problem List   Diagnosis Date Noted  . PTSD (post-traumatic stress disorder) [F43.10] 12/23/2016  . MDD (major depressive disorder), recurrent severe, without psychosis (HCC) [F33.2] 09/07/2015  . Cocaine dependence (HCC) [F14.20] 08/31/2014  . Alcohol abuse [F10.10] 08/31/2014    Past Psychiatric History:   Past Medical History:  Past Medical History:  Diagnosis Date  . Anxiety   . Depression   . GERD (gastroesophageal reflux disease)   . Hypertension   . Lactose intolerance   . Migraines    History reviewed. No pertinent surgical history. Family History:  Family History  Problem Relation Age of Onset  . Cancer Other   . Hypertension Other   . Heart attack Other    Family Psychiatric  History:  Social History:  History  Alcohol Use  . Yes    Comment: Alcohol 3x's a week      History  Drug Use  . Types: Cocaine    Comment: Daily cocaine use     Social History   Social History  . Marital status: Legally Separated    Spouse name: N/A  . Number of children: N/A  . Years of education: N/A   Social History Main Topics  . Smoking status: Current Every Day Smoker    Packs/day: 1.00    Years: 5.00    Types: Cigarettes  . Smokeless tobacco: Never Used  . Alcohol use Yes     Comment: Alcohol 3x's a week   . Drug use: Yes    Types: Cocaine     Comment: Daily cocaine use   . Sexual activity: Yes    Birth control/ protection: None   Other Topics  Concern  . None   Social History Narrative  . None    Hospital Course:  Per [facility] Assessment-Alexander McIntoshis a 56 y.o.malewho [facility] due to SI w/ a plan to drive his car off the road. Pt reports having SI for the past week and not being able to stop thinking about death. Pt is a 100% disabled veteran and is [facility]. Pt reports his prior intent to start an OP group for substance abuse and mood d/o at the [place] this week, but his SI has been so persistent, he felt he needed to come IP. Pt is unable to identify a specific trigger to his current feelings of SI. Pt reports being medication compliant.    ON Evaluation 06/19/17: [name] is awake, alert and oriented. Seen resting in dayroom. Patient continues to endorses suicidal ideations reports a recent relapse to drugs and alcohol. [facility] however [facility] only for [facility]. Denies homicidal ideation. Denies auditory or visual hallucination.Alexander Munoz is able to contract for safety. Reports he has NA sponsor.  Patient present with a flat and guarded affect. Reports he is medication complaint with the Wellbutrin. Support, encouragement and reassurance was provided.   Patient was continued on Wellbutrin, Trazodone and Vistaril. He was discharged home  and is following up at the [place] on Monday where he has been seen in the past. Patient attended group and participated. He was interacting appropriately with staff and other patients. Patient stabilized after 3 days and denies any SI/HI/AVH and contracts for safety.  Physical Findings: AIMS: Facial and Oral Movements Muscles of Facial Expression: None, normal Lips and Perioral Area: None, normal Jaw: None, normal Tongue: None, normal,Extremity Movements Upper (arms, wrists, hands, fingers): None, normal Lower (legs, knees, ankles, toes): None, normal, Trunk Movements Neck, shoulders, hips: None, normal, Overall  Severity Severity of abnormal movements (highest score from questions above): None, normal Incapacitation due to abnormal movements: None, normal Patient's awareness of abnormal movements (rate only patient's report): No Awareness, Dental Status Current problems with teeth and/or dentures?: No Does patient usually wear dentures?: No  CIWA:    COWS:     Musculoskeletal: Strength & Muscle Tone: within normal limits Gait & Station: normal Patient leans: N/A  Psychiatric Specialty Exam: Physical Exam  Nursing note and vitals reviewed. Constitutional: He is oriented to person, place, and time. He appears well-developed and well-nourished.  Cardiovascular: Normal rate.   Respiratory: Effort normal.  Musculoskeletal: Normal range of motion.  Neurological: He is alert and oriented to person, place, and time.  Skin: Skin is warm.    Review of Systems  Constitutional: Negative.   HENT: Negative.   Eyes: Negative.   Respiratory: Negative.   Cardiovascular: Negative.   Gastrointestinal: Negative.   Genitourinary: Negative.   Musculoskeletal: Negative.   Skin: Negative.   Neurological: Negative.   Endo/Heme/Allergies: Negative.     Blood pressure 120/86, pulse 67, temperature 98.7 F (37.1 C), temperature source Oral, resp. rate 20, height 5\' 8"  (1.727 m), weight 111.6 kg (246 lb), SpO2 100 %.Body mass index is 37.4 kg/m.  General Appearance: Casual  Eye Contact:  Good  Speech:  Clear and Coherent and Normal Rate  Volume:  Normal  Mood:  Euthymic  Affect:  Appropriate  Thought Process:  Coherent and Descriptions of Associations: Intact  Orientation:  Full (Time, Place, and Person)  Thought Content:  WDL  Suicidal Thoughts:  No  Homicidal Thoughts:  No  Memory:  Immediate;   Good Recent;   Good  Judgement:  Good  Insight:  Good  Psychomotor Activity:  Normal  Concentration:  Concentration: Good  Recall:  Good  Fund of Knowledge:  Good  Language:  Good  Akathisia:  No   Handed:  Right  AIMS (if indicated):     Assets:  Communication Skills Desire for Improvement Financial Resources/Insurance Housing Physical Health Social Support Transportation  ADL's:  Intact  Cognition:  WNL  Sleep:  Number of Hours: 6.25     Have you used any form of tobacco in the last 30 days? (Cigarettes, Smokeless Tobacco, Cigars, and/or Pipes): Yes  Has this patient used any form of tobacco in the last 30 days? (Cigarettes, Smokeless Tobacco, Cigars, and/or Pipes) Yes, Yes, A prescription for an FDA-approved tobacco cessation medication was offered at discharge and the patient refused  Blood Alcohol level:  Lab Results  Component Value Date   [facility] <5 12/22/2016   ETH <5 10/02/2016    Metabolic Disorder Labs:  Lab Results  Component Value Date   HGBA1C 6.0 (H) 06/18/2017   MPG 125.5 06/18/2017   No results found for: PROLACTIN Lab Results  Component Value Date   CHOL 120 06/19/2017   TRIG 242 (H) 06/19/2017   HDL 34 (L) 06/19/2017  CHOLHDL 3.5 06/19/2017   VLDL 48 (H) 06/19/2017   LDLCALC 38 06/19/2017    See Psychiatric Specialty Exam and Suicide Risk Assessment completed by Attending Physician prior to discharge.  Discharge destination:  Home  Is patient on multiple antipsychotic therapies at discharge:  No   Has Patient had three or more failed trials of antipsychotic monotherapy by history:  No  Recommended Plan for Multiple Antipsychotic Therapies: NA   Allergies as of 06/22/2017      Reactions   Lactose Intolerance (gi) Other (See Comments)   Gi upset       Medication List    STOP taking these medications   hydrochlorothiazide 12.5 MG capsule Commonly known as:  MICROZIDE   nicotine 21 mg/24hr patch Commonly known as:  NICODERM CQ - dosed in mg/24 hours   omeprazole 20 MG capsule Commonly known as:  PRILOSEC     TAKE these medications     Indication  buPROPion 300 MG 24 hr tablet Commonly known as:  WELLBUTRIN XL Take 1 tablet  (300 mg total) by mouth daily. For depression What changed:  Another medication with the same name was changed. Make sure you understand how and when to take each.  Indication:  Major Depressive Disorder   buPROPion 300 MG 24 hr tablet Commonly known as:  WELLBUTRIN XL Take 1 tablet (300 mg total) by mouth daily. What changed:  medication strength  how much to take  Indication:  Major Depressive Disorder   hydrOXYzine 50 MG tablet Commonly known as:  ATARAX/VISTARIL Take 1 tablet (50 mg total) by mouth 3 (three) times daily as needed for anxiety.  Indication:  Feeling Anxious   lisinopril 20 MG tablet Commonly known as:  PRINIVIL,ZESTRIL Take 1 tablet (20 mg total) by mouth daily. What changed:  Another medication with the same name was removed. Continue taking this medication, and follow the directions you see here.  Indication:  High Blood Pressure Disorder   Melatonin 3 MG Tabs Take 6 mg by mouth at bedtime.  Indication:  Trouble Sleeping   pantoprazole 40 MG tablet Commonly known as:  PROTONIX Take 1 tablet (40 mg total) by mouth daily. What changed:  additional instructions  Indication:  Gastroesophageal Reflux Disease   traZODone 50 MG tablet Commonly known as:  DESYREL Take 1 tablet (50 mg total) by mouth at bedtime as needed and may repeat dose one time if needed for sleep. What changed:  when to take this  Indication:  Trouble Sleeping      Follow-up Information    [facility] Follow up on 06/29/2017.   Why:  Hospital follow-up on Friday at 1:30PM. Thank you.  Contact information: [address] [facility] [facility] [place], [place] [address] Phone: [phone] ext [phone] Fax: [phone]          Follow-up recommendations:  Continue activity as tolerated. Continue diet as recommended by your PCP. Ensure to keep all appointments with outpatient providers.  Comments:  Patient is instructed prior to discharge to: Take all  medications as prescribed by his/her mental healthcare provider. Report any adverse effects and or reactions from the medicines to his/her outpatient provider promptly. Patient has been instructed & cautioned: To not engage in alcohol and or illegal drug use while on prescription medicines. In the event of worsening symptoms, patient is instructed to call the crisis hotline, 911 and or go to the nearest ED for appropriate evaluation and treatment of symptoms. To follow-up with his/her primary care  provider for your other medical issues, concerns and or health care needs.    Signed: Gerlene Burdock Gabreille Dardis, FNP 06/22/2017, 7:05 AM

## 2017-06-22 NOTE — Progress Notes (Signed)
Alexander Munoz is prepared for d/c as this Clinical research associate reviewed all of his dc instructions with him. He completed his daily assessment and on  This  he wrote he denied SI and he rated his depression, hopelessness and anxiety and he rated them " 3/0/2", respectively and he deneid SI today. Pt demonstrates pos understanding and all belongings are returned to him and he was escorted to bldg entrance and dc'd per DC poc. DC completed by ( EA ).

## 2017-06-22 NOTE — Progress Notes (Signed)
  [facility] Adult Case Management Discharge Plan :  Will you be returning to the same living situation after discharge:  Yes,  pt returning home. At discharge, do you have transportation home?: Yes,  pt has access to transportation. Do you have the ability to pay for your medications: Yes,  pt has insurance.  Release of information consent forms completed and in the chart;  Patient's signature needed at discharge.  Patient to Follow up at: Follow-up Information    [facility] Follow up on 06/29/2017.   Why:  Hospital follow-up on Friday at 1:30PM. Thank you.  Contact information: [address] [facility] [place], [place] [address] Phone: [phone] ext [phone] Fax: [phone]          Next level of care provider has access to [facility] Link:no  Safety Planning and Suicide Prevention discussed: Yes,  with pt.  Have you used any form of tobacco in the last 30 days? (Cigarettes, Smokeless Tobacco, Cigars, and/or Pipes): Yes  Has patient been referred to the Quitline?: Patient refused referral  Patient has been referred for addiction treatment: Yes  Jonathon Jordan, MSW, LCSWA 06/22/2017, 9:29 AM

## 2017-11-24 ENCOUNTER — Inpatient Hospital Stay (HOSPITAL_COMMUNITY)
Admission: RE | Admit: 2017-11-24 | Discharge: 2017-11-27 | DRG: 885 | Disposition: A | Discharge status: Home / Self Care | Payer: Medicare Other | Attending: Psychiatry | Admitting: Psychiatry

## 2017-11-24 ENCOUNTER — Encounter (HOSPITAL_COMMUNITY): Payer: Self-pay | Admitting: *Deleted

## 2017-11-24 ENCOUNTER — Other Ambulatory Visit: Payer: Self-pay

## 2017-11-24 DIAGNOSIS — Z801 Family history of malignant neoplasm of trachea, bronchus and lung: Secondary | ICD-10-CM

## 2017-11-24 DIAGNOSIS — R45851 Suicidal ideations: Secondary | ICD-10-CM | POA: Diagnosis present

## 2017-11-24 DIAGNOSIS — G4700 Insomnia, unspecified: Secondary | ICD-10-CM | POA: Diagnosis present

## 2017-11-24 DIAGNOSIS — Z915 Personal history of self-harm: Secondary | ICD-10-CM | POA: Diagnosis not present

## 2017-11-24 DIAGNOSIS — F4310 Post-traumatic stress disorder, unspecified: Secondary | ICD-10-CM | POA: Diagnosis present

## 2017-11-24 DIAGNOSIS — F419 Anxiety disorder, unspecified: Secondary | ICD-10-CM | POA: Diagnosis present

## 2017-11-24 DIAGNOSIS — Z8249 Family history of ischemic heart disease and other diseases of the circulatory system: Secondary | ICD-10-CM | POA: Diagnosis not present

## 2017-11-24 DIAGNOSIS — F1410 Cocaine abuse, uncomplicated: Secondary | ICD-10-CM | POA: Diagnosis present

## 2017-11-24 DIAGNOSIS — I10 Essential (primary) hypertension: Secondary | ICD-10-CM | POA: Diagnosis present

## 2017-11-24 DIAGNOSIS — F329 Major depressive disorder, single episode, unspecified: Secondary | ICD-10-CM | POA: Diagnosis present

## 2017-11-24 DIAGNOSIS — F332 Major depressive disorder, recurrent severe without psychotic features: Secondary | ICD-10-CM | POA: Diagnosis present

## 2017-11-24 DIAGNOSIS — K219 Gastro-esophageal reflux disease without esophagitis: Secondary | ICD-10-CM | POA: Diagnosis present

## 2017-11-24 DIAGNOSIS — F1420 Cocaine dependence, uncomplicated: Secondary | ICD-10-CM

## 2017-11-24 MED ORDER — ALUM & MAG HYDROXIDE-SIMETH 200-200-20 MG/5ML PO SUSP: 30.0000 mL | ORAL | Status: DC | PRN

## 2017-11-24 MED ORDER — TRAZODONE HCL 50 MG PO TABS
50.0000 mg | ORAL_TABLET | Freq: Every evening | ORAL | Status: DC | PRN
  Administered 2017-11-24: 50 mg via ORAL
  Filled 2017-11-24: qty 1

## 2017-11-24 MED ORDER — LISINOPRIL 20 MG PO TABS
20.0000 mg | ORAL_TABLET | Freq: Every day | ORAL | Status: DC
  Administered 2017-11-25 – 2017-11-27 (×3): 20 mg via ORAL
  Filled 2017-11-24 (×5): qty 1

## 2017-11-24 MED ORDER — BUPROPION HCL ER (XL) 300 MG PO TB24
300.0000 mg | ORAL_TABLET | Freq: Every day | ORAL | Status: DC
  Administered 2017-11-25 – 2017-11-27 (×3): 300 mg via ORAL
  Filled 2017-11-24 (×5): qty 1

## 2017-11-24 MED ORDER — HYDROXYZINE HCL 50 MG PO TABS
50.0000 mg | ORAL_TABLET | Freq: Two times a day (BID) | ORAL | Status: DC | PRN
  Administered 2017-11-24: 50 mg via ORAL
  Filled 2017-11-24: qty 1

## 2017-11-24 MED ORDER — MAGNESIUM HYDROXIDE 400 MG/5ML PO SUSP: 30.0000 mL | Freq: Every day | ORAL | Status: DC | PRN

## 2017-11-24 MED ORDER — ACETAMINOPHEN 325 MG PO TABS: 650.0000 mg | ORAL_TABLET | Freq: Four times a day (QID) | ORAL | Status: DC | PRN

## 2017-11-24 MED ORDER — HYDROCHLOROTHIAZIDE 12.5 MG PO CAPS
12.5000 mg | ORAL_CAPSULE | Freq: Every day | ORAL | Status: DC
  Administered 2017-11-25 – 2017-11-27 (×3): 12.5 mg via ORAL
  Filled 2017-11-24 (×5): qty 1

## 2017-11-24 NOTE — Tx Team (Signed)
Initial Treatment Plan 11/24/2017 7:31 PM Alexander Munoz ZOX:[id number]    PATIENT STRESSORS: Educational concerns Financial difficulties   PATIENT STRENGTHS: Ability for insight Active sense of humor Average or above average intelligence   PATIENT IDENTIFIED PROBLEMS: mdd WITH si  sUBSTANCE aBUSE                   DISCHARGE CRITERIA:  Ability to meet basic life and health needs Adequate post-discharge living arrangements  PRELIMINARY DISCHARGE PLAN: Attend aftercare/continuing care group Attend PHP/IOP Attend 12-step recovery group  PATIENT/FAMILY INVOLVEMENT: This treatment plan has been presented to and reviewed with the patient, Alexander BailiffJohn Munoz, and/or family member, mdd WITH.  The patient and family have been given the opportunity to ask questions and make suggestions.  Rich Braveuke, Shanyiah Conde Lynn, RN 11/24/2017, 7:31 PM

## 2017-11-24 NOTE — H&P (Signed)
Behavioral Health Medical Screening Exam  Alexander BailiffJohn Munoz is an 57 y.o. male [facility] with c/o depression and suicide ideations with plan to run into traffic. Patient unable to contract for safety.  Total Time spent with patient: 30 minutes  Psychiatric Specialty Exam: Physical Exam  Vitals reviewed. Constitutional: He is oriented to person, place, and time. He appears well-developed and well-nourished.  HENT:  Head: Normocephalic and atraumatic.  Eyes: Pupils are equal, round, and reactive to light.  Neck: Normal range of motion.  Cardiovascular: Normal rate, regular rhythm and normal heart sounds.  Respiratory: Effort normal and breath sounds normal.  GI: Soft. Bowel sounds are normal.  Musculoskeletal: Normal range of motion.  Neurological: He is alert and oriented to person, place, and time.  Skin: Skin is warm and dry.    Review of Systems  Psychiatric/Behavioral: Positive for depression, substance abuse and suicidal ideas. Negative for hallucinations and memory loss. The patient is nervous/anxious and has insomnia.   All other systems reviewed and are negative.   Blood pressure (!) 147/80, pulse (!) 58, temperature 98.2 F (36.8 C), temperature source Oral, resp. rate 18, SpO2 98 %.There is no height or weight on file to calculate BMI.  General Appearance: Casual  Eye Contact:  Good  Speech:  Clear and Coherent and Normal Rate  Volume:  Normal  Mood:  Anxious and Depressed  Affect:  Depressed and Flat  Thought Process:  Coherent and Goal Directed  Orientation:  Full (Time, Place, and Person)  Thought Content:  WDL and Logical  Suicidal Thoughts:  Yes.  with intent/plan  Homicidal Thoughts:  No  Memory:  Immediate;   Good Recent;   Good Remote;   Fair  Judgement:  Good  Insight:  Fair  Psychomotor Activity:  Normal  Concentration: Concentration: Good and Attention Span: Good  Recall:  Good  Fund of Knowledge:Good  Language: Good   Akathisia:  Negative  Handed:  Right  AIMS (if indicated):     Assets:  Communication Skills Desire for Improvement Financial Resources/Insurance Housing Leisure Time Physical Health Resilience  Sleep:       Musculoskeletal: Strength & Muscle Tone: within normal limits Gait & Station: normal Patient leans: N/A  Blood pressure (!) 147/80, pulse (!) 58, temperature 98.2 F (36.8 C), temperature source Oral, resp. rate 18, SpO2 98 %.  Recommendations:  Based on my evaluation the patient appears to have an emergency condition for which I recommend the patient be admitted inpatient for medication management and stabilization.   [name], NP 11/24/2017, 2:56 PM

## 2017-11-24 NOTE — Progress Notes (Signed)
Patient ID: Alexander BailiffJohn Munoz, male   DOB: Sep 02, 1961, 57 y.o.   MRN: [id number] Per State regulations 482.30 this chart was reviewed for medical necessity with respect to the patient's admission/duration of stay.    Next review date: 11/28/17  Thurman CoyerEric Gottfried Standish, BSN, RN-BC  Case Manager

## 2017-11-24 NOTE — Progress Notes (Signed)
Pt attended evening AA group. 
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
## 2017-11-25 DIAGNOSIS — Z91410 Personal history of adult physical and sexual abuse: Secondary | ICD-10-CM

## 2017-11-25 DIAGNOSIS — F1994 Other psychoactive substance use, unspecified with psychoactive substance-induced mood disorder: Secondary | ICD-10-CM

## 2017-11-25 DIAGNOSIS — Z915 Personal history of self-harm: Secondary | ICD-10-CM

## 2017-11-25 DIAGNOSIS — F1490 Cocaine use, unspecified, uncomplicated: Secondary | ICD-10-CM

## 2017-11-25 LAB — CBC
HCT: 39.7 % (ref 39.0–52.0)
Hemoglobin: 13.1 g/dL (ref 13.0–17.0)
MCH: 25.9 pg — AB (ref 26.0–34.0)
MCHC: 33 g/dL (ref 30.0–36.0)
MCV: 78.6 fL (ref 78.0–100.0)
Platelets: 407 10*3/uL — ABNORMAL HIGH (ref 150–400)
RBC: 5.05 MIL/uL (ref 4.22–5.81)
RDW: 17 % — ABNORMAL HIGH (ref 11.5–15.5)
WBC: 7 10*3/uL (ref 4.0–10.5)

## 2017-11-25 LAB — LIPID PANEL
CHOL/HDL RATIO: 3.3 ratio
CHOLESTEROL: 148 mg/dL (ref 0–200)
HDL: 45 mg/dL (ref >40)
LDL Cholesterol: 76 mg/dL (ref 0–99)
Triglycerides: 136 mg/dL (ref <150)
VLDL: 27 mg/dL (ref 0–40)

## 2017-11-25 LAB — URINALYSIS, COMPLETE (UACMP) WITH MICROSCOPIC
BACTERIA UA: NONE SEEN
Bilirubin Urine: NEGATIVE
Glucose, UA: NEGATIVE mg/dL
Hgb urine dipstick: NEGATIVE
KETONES UR: NEGATIVE mg/dL
LEUKOCYTES UA: NEGATIVE
Nitrite: NEGATIVE
PROTEIN: NEGATIVE mg/dL
Specific Gravity, Urine: 1.012 (ref 1.005–1.030)
pH: 6 (ref 5.0–8.0)

## 2017-11-25 LAB — COMPREHENSIVE METABOLIC PANEL
ALT: 19 U/L (ref 17–63)
ANION GAP: 5 (ref 5–15)
AST: 21 U/L (ref 15–41)
Albumin: 3.1 g/dL — ABNORMAL LOW (ref 3.5–5.0)
Alkaline Phosphatase: 49 U/L (ref 38–126)
BILIRUBIN TOTAL: 0.3 mg/dL (ref 0.3–1.2)
BUN: 15 mg/dL (ref 6–20)
CALCIUM: 8.4 mg/dL — AB (ref 8.9–10.3)
CO2: 25 mmol/L (ref 22–32)
Chloride: 110 mmol/L (ref 101–111)
Creatinine, Ser: 1.47 mg/dL — ABNORMAL HIGH (ref 0.61–1.24)
GFR, EST AFRICAN AMERICAN: 59 mL/min — AB (ref >60)
GFR, EST NON AFRICAN AMERICAN: 51 mL/min — AB (ref >60)
Glucose, Bld: 114 mg/dL — ABNORMAL HIGH (ref 65–99)
Potassium: 3.9 mmol/L (ref 3.5–5.1)
Sodium: 140 mmol/L (ref 135–145)
TOTAL PROTEIN: 5.7 g/dL — AB (ref 6.5–8.1)

## 2017-11-25 LAB — TSH: TSH: 6.288 u[IU]/mL — AB (ref 0.350–4.500)

## 2017-11-25 LAB — RAPID URINE DRUG SCREEN, HOSP PERFORMED
Amphetamines: NOT DETECTED
Barbiturates: NOT DETECTED
Benzodiazepines: NOT DETECTED
COCAINE: POSITIVE — AB
OPIATES: NOT DETECTED
Tetrahydrocannabinol: POSITIVE — AB

## 2017-11-25 LAB — BILIRUBIN, DIRECT: Bilirubin, Direct: <0.1 mg/dL — ABNORMAL LOW (ref 0.1–0.5)

## 2017-11-25 LAB — HEMOGLOBIN A1C
HEMOGLOBIN A1C: 6.2 % — AB (ref 4.8–5.6)
MEAN PLASMA GLUCOSE: 131.24 mg/dL

## 2017-11-25 MED ORDER — RAMELTEON 8 MG PO TABS
8.0000 mg | ORAL_TABLET | Freq: Every day | ORAL | Status: DC
  Administered 2017-11-25 – 2017-11-26 (×2): 8 mg via ORAL
  Filled 2017-11-25 (×4): qty 1

## 2017-11-25 MED ORDER — HYDROXYZINE HCL 25 MG PO TABS
25.0000 mg | ORAL_TABLET | Freq: Four times a day (QID) | ORAL | Status: DC | PRN
  Administered 2017-11-25 – 2017-11-26 (×2): 25 mg via ORAL
  Filled 2017-11-25 (×3): qty 1

## 2017-11-25 NOTE — [facility] Suicide Risk Assessment (Signed)
[facility]   Nursing information obtained from:    patient and chart  Demographic factors:   57 year old male, separated, lives alone, on disability Current Mental Status:   see below Loss Factors:   substance abuse, medication non compliance, disability Historical Factors:   history of depression, history of cocaine abuse, history of PTSD diagnosis, history of long term management with Buproprion Risk Reduction Factors:   Resilience  Total Time spent with patient: 45 minutes Principal Problem: MDD, Cocaine Use Disorder  Diagnosis:   Patient Active Problem List   Diagnosis Date Noted  . MDD (major depressive disorder) [F32.9] 11/24/2017  . PTSD (post-traumatic stress disorder) [F43.10] 12/23/2016  . MDD (major depressive disorder), recurrent severe, without psychosis (HCC) [F33.2] 09/07/2015  . Cocaine dependence (HCC) [F14.20] 08/31/2014  . Alcohol abuse [F10.10] 08/31/2014    Continued Clinical Symptoms:    The "Alcohol Use Disorders Identification Test", Guidelines for Use in Primary Care, Second Edition.  World Science writerHealth Organization [facility](WHO). Score between 0-7:  no or low risk or alcohol related problems. Score between 8-15:  moderate risk of alcohol related problems. Score between 16-19:  high risk of alcohol related problems. Score 20 or above:  warrants further diagnostic evaluation for alcohol dependence and treatment.   CLINICAL FACTORS:  57 year old male, known to our unit from prior admissions, history of depression, PTSD, cocaine abuse . Reports worsening depression and suicidal ideations in the context of medication non compliance and cocaine abuse in binges .   Psychiatric Specialty Exam: Physical Exam  ROS  Blood pressure (!) 140/101, pulse (!) 51, temperature 98.9 F (37.2 C), temperature source Oral, resp. rate 16, SpO2 98 %.There is no height or weight on file to calculate BMI.  See admit note MSE   COGNITIVE FEATURES THAT CONTRIBUTE  TO RISK:  Closed-mindedness and Loss of executive function    SUICIDE RISK:   Moderate:  Frequent suicidal ideation with limited intensity, and duration, some specificity in terms of plans, no associated intent, good self-control, limited dysphoria/symptomatology, some risk factors present, and identifiable protective factors, including available and accessible social support.  PLAN OF CARE: Patient will be admitted to inpatient psychiatric unit for stabilization and safety. Will provide and encourage milieu participation. Provide medication management and maked adjustments as needed.  Will follow daily.    I certify that inpatient services furnished can reasonably be expected to improve the patient's condition.   Craige CottaFernando A Theophil Thivierge, MD 11/25/2017, 2:03 PM
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
## 2017-11-25 NOTE — Progress Notes (Signed)
Pt attended orientation and daily goal group. During this group staff went over the treatment agreement, rules for the unit, and discussed how to set a daily goal.  

## 2017-11-25 NOTE — [facility] Group Notes (Signed)
[facility] LCSW Group Therapy Note  Date/Time:  11/25/2017  10:00-11:00AM  Type of Therapy and Topic:  Group Therapy:  Music and Mood  Participation Level:  None   Description of Group: In this process group, members listened to a variety of genres of music and identified that different types of music evoke different responses.  Patients were encouraged to identify music that was soothing for them and music that was energizing for them.  Patients discussed how this knowledge can help with wellness and recovery in various ways including managing depression and anxiety as well as encouraging healthy sleep habits.    Therapeutic Goals: 1. Patients will explore the impact of different varieties of music on mood 2. Patients will verbalize the thoughts they have when listening to different types of music 3. Patients will identify music that is soothing to them as well as music that is energizing to them 4. Patients will discuss how to use this knowledge to assist in maintaining wellness and recovery 5. Patients will explore the use of music as a coping skill  Summary of Patient Progress:   N/A  Therapeutic Modalities: Solution Focused Brief Therapy Activity   Ambrose MantleMareida Grossman-Orr, LCSW

## 2017-11-25 NOTE — [facility] Suicide Risk Assessment (Signed)
[facility] INPATIENT:  Family/Significant Other Suicide Prevention Education  Suicide Prevention Education:  Patient Refusal for Family/Significant Other Suicide Prevention Education: The patient Alexander Munoz has refused to provide written consent for family/significant other to be provided Family/Significant Other Suicide Prevention Education during admission and/or prior to discharge.  Physician notified.  SPE was reviewed with patient and he was provided a brochure.  Alexander Munoz 11/25/2017, 1:37 PM
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
## 2017-11-25 NOTE — Progress Notes (Signed)
Patient ID: Alexander Munoz, male   DOB: 06-20-1961, 57 y.o.   MRN: [id number]  Pt currently presents with a flat affect and guarded behavior. Pt forwards little to Clinical research associatewriter. Pt reports mild ongoing cravings currently. Pt reports good sleep with current medication regimen. Pt seen in the dayroom, limited interaction with peers noted tonight.   Pt provided with medications per providers orders. Pt's labs and vitals were monitored throughout the night. Pt given a 1:1 about emotional and mental status. Pt supported and encouraged to express concerns and questions.   Pt's safety ensured with 15 minute and environmental checks. Pt currently denies SI/HI and A/V hallucinations. Pt verbally agrees to seek staff if SI/HI or A/VH occurs and to consult with staff before acting on any harmful thoughts. Will continue POC.

## 2017-11-25 NOTE — Progress Notes (Signed)
D Patient is seen OOB UAL on the 300 hall today, tolerated well. He remains flat, quiet and keeping to himself. HE endorses a flat, depressed affect and he does not inititate conversation with his peers, but he does respond when he is spoken to  By this Clinical research associatewriter. A He completed his daily assessment and on this he wrote he denied SI and he rated his depression, hopelessness and anxeity " 6/5/5", respectively.

## 2017-11-26 DIAGNOSIS — F332 Major depressive disorder, recurrent severe without psychotic features: Principal | ICD-10-CM

## 2017-11-26 DIAGNOSIS — R45851 Suicidal ideations: Secondary | ICD-10-CM

## 2017-11-26 DIAGNOSIS — F17210 Nicotine dependence, cigarettes, uncomplicated: Secondary | ICD-10-CM

## 2017-11-26 DIAGNOSIS — F1420 Cocaine dependence, uncomplicated: Secondary | ICD-10-CM

## 2017-11-26 DIAGNOSIS — F4310 Post-traumatic stress disorder, unspecified: Secondary | ICD-10-CM

## 2017-11-26 DIAGNOSIS — F419 Anxiety disorder, unspecified: Secondary | ICD-10-CM

## 2017-11-26 DIAGNOSIS — G4700 Insomnia, unspecified: Secondary | ICD-10-CM

## 2017-11-26 DIAGNOSIS — R450 Nervousness: Secondary | ICD-10-CM

## 2017-11-26 LAB — T4, FREE: Free T4: 0.97 ng/dL (ref 0.61–1.12)

## 2017-11-26 MED ORDER — PANTOPRAZOLE SODIUM 40 MG PO TBEC
40.0000 mg | DELAYED_RELEASE_TABLET | Freq: Every day | ORAL | Status: DC
  Administered 2017-11-26 – 2017-11-27 (×2): 40 mg via ORAL
  Filled 2017-11-26 (×4): qty 1

## 2017-11-26 NOTE — [facility] Group Notes (Signed)
Pt attended spiritual care group on grief and loss facilitated by chaplain Burnis KingfisherMatthew Hafsah Hendler   Group opened with brief discussion and psycho-social ed around grief and loss in relationships and in relation to self - identifying life patterns, circumstances, changes that cause losses. Established group norm of speaking from own life experience. Group goal of establishing open and affirming space for members to share loss and experience with grief, normalize grief experience and provide psycho social education and grief support.   Harvest was present through first part of group.  Did not engage in group discussion.  Left group 3/4 of way through.

## 2017-11-26 NOTE — Progress Notes (Signed)
[facility] MD Progress Note  11/26/2017 3:42 PM Alexander Munoz  MRN:  [id number]  Subjective: Alexander Munoz reports, "I'm still feeling down quite a bit. I have been feeling very depressed. Soon after discharge from this hospital last August, I relapsed on Cocaine & has been using since. Part of my problem is that I'm a disabled veteran, I live alone. I have been wondering what will become of me after discharge from this place. I'm trying to figure out what to do to stay clean. I have been sleeping well here. My depression is improving. My concern is staying clean after discharge. I have not talked with social worker yet to help me figure out what is next for me".  Objective: Patient is a 57 year old male, who presented to the hospital on 1/26 voluntarily. States he has been feeling more depressed, with suicidal ideations of driving off road. Endorses neuro-vegetative symptoms as below. Patient has a history of Cocaine Abuse, and states he has been using regularly, often in binges lasting 2-3 days. He also reports he had stopped his prescribed antidepressant ( Wellbutrin) three weeks ago after leaving his medication behind during a visit to family. Today, 11-26-17, Alexander Munoz is seen, chart reviewed. Chart findings discussed with the treatment team. He presents alert, oriented & aware of situation. He is lying down in his bed during this assessment. He says he is attending some group sessions. He continues to endorse symptoms of depression stemming from his inability to stop using cocaine. He blamed his drug problems on being alone & living alone. He says he is a disable veteran with not much to do. He says he is sleeping well. Appetite is good. He is tolerating his medications without any adverse effects reported. He denies any SIHI, AVH, delusional thoughts or paranoia. He does not appear to be responding to any internal stimuli. Staff continue to provide support.  Principal Problem: MDD (major depressive disorder), recurrent  severe, without psychosis ([place])  Diagnosis:   Patient Active Problem List   Diagnosis Date Noted  . MDD (major depressive disorder), recurrent severe, without psychosis ([place]) [F33.2] 09/07/2015    Priority: High  . MDD (major depressive disorder) [F32.9] 11/24/2017  . PTSD (post-traumatic stress disorder) [F43.10] 12/23/2016  . Cocaine use disorder, severe, dependence ([place]) [F14.20] 08/31/2014  . Alcohol abuse [F10.10] 08/31/2014   Total Time spent with patient: 25 minutes  Past Psychiatric History: See H&P  Past Medical History:  Past Medical History:  Diagnosis Date  . Anxiety   . Depression   . GERD (gastroesophageal reflux disease)   . Hypertension   . Lactose intolerance   . Migraines    History reviewed. No pertinent surgical history.  Family History:  Family History  Problem Relation Age of Onset  . Cancer Other   . Hypertension Other   . Heart attack Other    Family Psychiatric  History: See H&P.  Social History:  Social History   Substance and Sexual Activity  Alcohol Use Not on file   Comment: Alcohol 3x's a week      Social History   Substance and Sexual Activity  Drug Use Not on file   Comment: Daily cocaine use     Social History   Socioeconomic History  . Marital status: Legally Separated    Spouse name: None  . Number of children: None  . Years of education: None  . Highest education level: None  Social Needs  . Financial resource strain: None  . Food insecurity -  worry: None  . Food insecurity - inability: None  . Transportation needs - medical: None  . Transportation needs - non-medical: None  Occupational History  . None  Tobacco Use  . Smoking status: Current Every Day Smoker    Packs/day: 0.00    Years: 0.00    Pack years: 0.00  . Smokeless tobacco: Never Used  Substance and Sexual Activity  . Alcohol use: None    Comment: Alcohol 3x's a week   . Drug use: None    Comment: Daily cocaine use   . Sexual activity: None   Other Topics Concern  . None  Social History Narrative  . None   Additional Social History:    Pain Medications: denies Prescriptions: denies Over the Counter: denies History of alcohol / drug use?: Yes Longest period of sobriety (when/how long): 4 months in July 2018 Negative Consequences of Use: Financial, Personal relationships Withdrawal Symptoms: Agitation Name of Substance 1: crack 1 - Age of First Use: 26 1 - Amount (size/oz): $50-$200 1 - Frequency: 3 x/week 1 - Duration: ongoing 1 - Last Use / Amount: 3 days ago  Sleep: Good  Appetite:  Good  Current Medications: Current Facility-Administered Medications  Medication Dose Route Frequency Provider Last Rate Last Dose  . acetaminophen (TYLENOL) tablet 650 mg  650 mg Oral Q6H PRN Okonkwo, Justina A, NP      . alum & mag hydroxide-simeth (MAALOX/MYLANTA) 200-200-20 MG/5ML suspension 30 mL  30 mL Oral Q4H PRN Okonkwo, Justina A, NP      . buPROPion (WELLBUTRIN XL) 24 hr tablet 300 mg  300 mg Oral Daily Okonkwo, Justina A, NP   300 mg at 11/26/17 0831  . hydrochlorothiazide (MICROZIDE) capsule 12.5 mg  12.5 mg Oral Daily Okonkwo, Justina A, NP   12.5 mg at 11/26/17 0831  . hydrOXYzine (ATARAX/VISTARIL) tablet 25 mg  25 mg Oral Q6H PRN Cobos, Myer Peer, MD   25 mg at 11/25/17 2158  . lisinopril (PRINIVIL,ZESTRIL) tablet 20 mg  20 mg Oral Daily Okonkwo, Justina A, NP   20 mg at 11/26/17 0831  . magnesium hydroxide (MILK OF MAGNESIA) suspension 30 mL  30 mL Oral Daily PRN Lu Duffel, Justina A, NP      . ramelteon (ROZEREM) tablet 8 mg  8 mg Oral QHS Cobos, Myer Peer, MD   8 mg at 11/25/17 2156    Lab Results:  Results for orders placed or performed during the hospital encounter of 11/24/17 (from the past 48 hour(s))  CBC     Status: Abnormal   Collection Time: 11/25/17  6:30 AM  Result Value Ref Range   WBC 7.0 4.0 - 10.5 K/uL   RBC 5.05 4.22 - 5.81 MIL/uL   Hemoglobin 13.1 13.0 - 17.0 g/dL   HCT 39.7 39.0 - 52.0 %    MCV 78.6 78.0 - 100.0 fL   MCH 25.9 (L) 26.0 - 34.0 pg   MCHC 33.0 30.0 - 36.0 g/dL   RDW 17.0 (H) 11.5 - 15.5 %   Platelets 407 (H) 150 - 400 K/uL    Comment: Performed at [facility], [place] [address], [place], [place] [address]  Comprehensive metabolic panel     Status: Abnormal   Collection Time: 11/25/17  6:30 AM  Result Value Ref Range   Sodium 140 135 - 145 mmol/L   Potassium 3.9 3.5 - 5.1 mmol/L   Chloride 110 101 - 111 mmol/L   CO2 25 22 - 32 mmol/L  Glucose, Bld 114 (H) 65 - 99 mg/dL   BUN 15 6 - 20 mg/dL   Creatinine, Ser 1.47 (H) 0.61 - 1.24 mg/dL   Calcium 8.4 (L) 8.9 - 10.3 mg/dL   Total Protein 5.7 (L) 6.5 - 8.1 g/dL   Albumin 3.1 (L) 3.5 - 5.0 g/dL   AST 21 15 - 41 U/L   ALT 19 17 - 63 U/L   Alkaline Phosphatase 49 38 - 126 U/L   Total Bilirubin 0.3 0.3 - 1.2 mg/dL   GFR calc non Af Amer 51 (L) >60 mL/min   GFR calc Af Amer 59 (L) >60 mL/min    Comment: (NOTE) The eGFR has been calculated using the CKD EPI equation. This calculation has not been validated in all clinical situations. eGFR's persistently <60 mL/min signify possible Chronic Kidney Disease.    Anion gap 5 5 - 15    Comment: Performed at [facility], [place] [address] [place], [place] [address]  Hemoglobin A1c     Status: Abnormal   Collection Time: 11/25/17  6:30 AM  Result Value Ref Range   Hgb A1c MFr Bld 6.2 (H) 4.8 - 5.6 %    Comment: (NOTE) Pre diabetes:          5.7%-6.4% Diabetes:              >6.4% Glycemic control for   <7.0% adults with diabetes    Mean Plasma Glucose 131.24 mg/dL    Comment: Performed at [place] [address], [place], [place] [address]  Lipid panel     Status: None   Collection Time: 11/25/17  6:30 AM  Result Value Ref Range   Cholesterol 148 0 - 200 mg/dL   Triglycerides 136 <150 mg/dL   HDL 45 >40 mg/dL   Total CHOL/HDL Ratio 3.3 RATIO   VLDL 27 0 - 40 mg/dL   LDL Cholesterol 76 0 - 99 mg/dL    Comment:         Total Cholesterol/HDL:CHD Risk Coronary Heart Disease Risk Table                     Men   Women  1/2 Average Risk   3.4   3.3  Average Risk       5.0   4.4  2 X Average Risk   9.6   7.1  3 X Average Risk  23.4   11.0        Use the calculated Patient Ratio above and the CHD Risk Table to determine the patient's CHD Risk.        ATP III CLASSIFICATION (LDL):  <100     mg/dL   Optimal  100-129  mg/dL   Near or Above                    Optimal  130-159  mg/dL   Borderline  160-189  mg/dL   High  >190     mg/dL   Very High Performed at [place] [address] [place], [place] [address]   TSH     Status: Abnormal   Collection Time: 11/25/17  6:30 AM  Result Value Ref Range   TSH 6.288 (H) 0.350 - 4.500 uIU/mL    Comment: Performed by a 3rd Generation assay with a functional sensitivity of <=0.01 uIU/mL. Performed at [facility], [place] [address] [place], [place] [address]   Bilirubin, direct  Status: Abnormal   Collection Time: 11/25/17  6:30 AM  Result Value Ref Range   Bilirubin, Direct <0.1 (L) 0.1 - 0.5 mg/dL    Comment: Performed at [facility], [place] [address] [place], [place] [address]  Urinalysis, Complete w Microscopic     Status: Abnormal   Collection Time: 11/25/17  9:15 AM  Result Value Ref Range   Color, Urine STRAW (A) YELLOW   APPearance CLEAR CLEAR   Specific Gravity, Urine 1.012 1.005 - 1.030   pH 6.0 5.0 - 8.0   Glucose, UA NEGATIVE NEGATIVE mg/dL   Hgb urine dipstick NEGATIVE NEGATIVE   Bilirubin Urine NEGATIVE NEGATIVE   Ketones, ur NEGATIVE NEGATIVE mg/dL   Protein, ur NEGATIVE NEGATIVE mg/dL   Nitrite NEGATIVE NEGATIVE   Leukocytes, UA NEGATIVE NEGATIVE   RBC / HPF 0-5 0 - 5 RBC/hpf   WBC, UA 0-5 0 - 5 WBC/hpf   Bacteria, UA NONE SEEN NONE SEEN   Squamous Epithelial / LPF 0-5 (A) NONE SEEN   Mucus PRESENT     Comment: Performed at [facility], [place]  [address] [place], [place] [address]  Rapid urine drug screen (hospital performed)     Status: Abnormal   Collection Time: 11/25/17  9:16 AM  Result Value Ref Range   Opiates NONE DETECTED NONE DETECTED   Cocaine POSITIVE (A) NONE DETECTED   Benzodiazepines NONE DETECTED NONE DETECTED   Amphetamines NONE DETECTED NONE DETECTED   Tetrahydrocannabinol POSITIVE (A) NONE DETECTED   Barbiturates NONE DETECTED NONE DETECTED    Comment: (NOTE) DRUG SCREEN FOR MEDICAL PURPOSES ONLY.  IF CONFIRMATION IS NEEDED FOR ANY PURPOSE, NOTIFY LAB WITHIN 5 DAYS. LOWEST DETECTABLE LIMITS FOR URINE DRUG SCREEN Drug Class                     Cutoff (ng/mL) Amphetamine and metabolites    1000 Barbiturate and metabolites    200 Benzodiazepine                 914 Tricyclics and metabolites     300 Opiates and metabolites        300 Cocaine and metabolites        300 THC                            50 Performed at [facility], [place] [address] [place], [place] [address]   T4, free     Status: None   Collection Time: 11/26/17  6:29 AM  Result Value Ref Range   Free T4 0.97 0.61 - 1.12 ng/dL    Comment: (NOTE) Biotin ingestion may interfere with free T4 tests. If the results are inconsistent with the TSH level, previous test results, or the clinical presentation, then consider biotin interference. If needed, order repeat testing after stopping biotin. Performed at [facility], [place] [address], [place], [place] [address]     Blood Alcohol level:  Lab Results  Component Value Date   [facility] <5 12/22/2016   ETH <5 86/57/8469    Metabolic Disorder Labs: Lab Results  Component Value Date   HGBA1C 6.2 (H) 11/25/2017   MPG 131.24 11/25/2017   MPG 125.5 06/18/2017   No results found for: PROLACTIN Lab Results  Component Value Date   CHOL 148 11/25/2017   TRIG 136 11/25/2017   HDL 45 11/25/2017   CHOLHDL 3.3 11/25/2017   VLDL  27 11/25/2017   LDLCALC 76 11/25/2017    LDLCALC 38 06/19/2017    Physical Findings: AIMS: Facial and Oral Movements Muscles of Facial Expression: None, normal Lips and Perioral Area: None, normal Jaw: None, normal Tongue: None, normal,Extremity Movements Upper (arms, wrists, hands, fingers): None, normal Lower (legs, knees, ankles, toes): None, normal, Trunk Movements Neck, shoulders, hips: None, normal, Overall Severity Severity of abnormal movements (highest score from questions above): None, normal Incapacitation due to abnormal movements: None, normal Patient's awareness of abnormal movements (rate only patient's report): No Awareness, Dental Status Current problems with teeth and/or dentures?: No Does patient usually wear dentures?: No  CIWA:  CIWA-Ar Total: 7 COWS:  COWS Total Score: 0  Musculoskeletal: Strength & Muscle Tone: within normal limits Gait & Station: normal Patient leans: N/A  Psychiatric Specialty Exam: Physical Exam  Nursing note and vitals reviewed.   Review of Systems  Psychiatric/Behavioral: Positive for depression and substance abuse (Hx. Cocaine use disorder). Negative for hallucinations, memory loss and suicidal ideas. The patient is nervous/anxious and has insomnia.     Blood pressure 129/90, pulse (!) 59, temperature 98 F (36.7 C), temperature source Oral, resp. rate 20, SpO2 98 %.There is no height or weight on file to calculate BMI.  General Appearance: Fairly Groomed  Eye Contact:  Fair  Speech:  Normal Rate  Volume:  Decreased  Mood:  reports feeling depressed, and describes mood as 4/10  Affect:  mildly constricted  Thought Process:  Linear and Descriptions of Associations: Intact  Orientation:  Other:  fully alert and attentive , oriented x 3   Thought Content:  denies hallucinations, no delusions, not internally preoccupied   Suicidal Thoughts:  No denies any current suicidal or self injurious ideations, and contracts for safety on unit, denies homicidal or violent ideations   Homicidal Thoughts:  No  Memory:  recent and remote grossly intact   Judgement:  Fair  Insight:  Fair  Psychomotor Activity:  Normal  Concentration:  Concentration: Good and Attention Span: Good  Recall:  Good  Fund of Knowledge:  Good  Language:  Good  Akathisia:  Negative  Handed:  Right  AIMS (if indicated):     Assets:  Communication Skills Desire for Improvement Resilience  ADL's:  Intact  Cognition:  WNL  Sleep:  Number of Hours: 6.75     Treatment Plan Summary: Daily contact with patient to assess and evaluate symptoms and progress in treatment. Patient is not at his baseline yet, he is endorsing symptoms of depression. - Continue inpatient hospitalization.   - Will continue today 11/26/2017 plan as below except where it is noted.  Depression.    - Continue Wellbutrin 300 mg po daily.  Anxiety.    - Continue Hydroxyzine 25 mg po prn Q 6 hrs.  Insomnia.    - Continue Rozerem 8 mg po Q hs.      - Continue all other medication regimen for the other pre-existing medical issues presented.       - Continue to encourage group session participation & attendance.   - SW to continue to work on the discharge disposition.  Lindell Spar, NP, PMHNP, FNP-BC 11/26/2017, 3:42 PM

## 2017-11-26 NOTE — Plan of Care (Signed)
  Progressing Activity: Interest or engagement in activities will improve 11/26/2017 1553 - Progressing by Layla BarterWhite, Symantha Steeber L, RN Sleeping patterns will improve 11/26/2017 1553 - Progressing by Layla BarterWhite, Andrian Urbach L, RN Education: Emotional status will improve 11/26/2017 1553 - Progressing by Layla BarterWhite, Ivionna Verley L, RN Verbalization of understanding the information provided will improve 11/26/2017 1553 - Progressing by Layla BarterWhite, Avram Danielson L, RN Coping: Ability to demonstrate self-control will improve 11/26/2017 1553 - Progressing by Layla BarterWhite, Kaulin Chaves L, RN Health Behavior/Discharge Planning: Compliance with treatment plan for underlying cause of condition will improve 11/26/2017 1553 - Progressing by Layla BarterWhite, Alegria Dominique L, RN

## 2017-11-26 NOTE — Progress Notes (Signed)
  Adult Psychoeducational Group Note  Date:  11/26/2017 Time:  4:42 PM  Group Topic/Focus:  BINGO/Relaxation Activity   Participation Level:  Active  Participation Quality:  Appropriate  Affect:  Appropriate  Cognitive:  Appropriate  Insight: Appropriate  Engagement in Group:  Engaged  Modes of Intervention:  Activity  Additional Comments:    Shaiden Aldous L 11/26/2017, 4:42 PM  

## 2017-11-26 NOTE — Progress Notes (Signed)
Pt presents with flat affect and depressed mood. Pt reports decreased depression and anxiety today. Pt rates depression 5/10. Anxiety 5/10. Pt reports fair sleep last night. Pt denies SI. Pt denies any withdrawal symptoms. Pt compliant with taking meds and attending groups. Pt stated goal today is to work on his d/c plan. Medications administered as ordered per MD. Verbal support provided. Pt encouraged to attend groups. 15 minute checks performed for safety.

## 2017-11-26 NOTE — Progress Notes (Signed)
Recreation Therapy Notes  Date: 11/26/17 Time: 0930 Location: 300 Hall Dayroom  Group Topic: Stress Management  Goal Area(s) Addresses:  Patient will verbalize importance of using healthy stress management.  Patient will identify positive emotions associated with healthy stress management.   Intervention: Stress Management  Activity : Meditation.  LRT played a body scan meditation from the Calm app. The meditation allowed patients to take inventory of any sensations they may or may not have been feeling.  Education:  Stress Management, Discharge Planning.   Education Outcome: Acknowledges edcuation/In group clarification offered/Needs additional education  Clinical Observations/Feedback: Pt did not attend group.     Caroll RancherMarjette Denean Pavon, LRT/CTRS         Caroll RancherLindsay, Chesney Suares A 11/26/2017 12:09 PM

## 2017-11-26 NOTE — [facility] Group Notes (Signed)
[facility] Group Therapy 11/26/2017 1:15pm  Type of Therapy: Mental Health [employer] Presentation  Participation Level: Active  Participation Quality: Attentive  Affect: Appropriate  Cognitive: Oriented  Insight: Developing/Improving  Engagement in Therapy: Engaged  Modes of Intervention: Discussion, Education and Socialization  Summary of Progress/Problems: Mental Health Association (MHA) Speaker came to talk about his personal journey with mental health. The pt processed ways by which to relate to the speaker. [name] provided handouts and educational information pertaining to groups and services offered by the MHA. Pt was engaged in speaker's presentation and was receptive to resources provided.    Chiffon Kittleson N Smart, LCSW 11/26/2017 2:05 PM  

## 2017-11-26 NOTE — Progress Notes (Signed)
Patient attended AA group meeting tonight.  

## 2017-11-26 NOTE — Progress Notes (Signed)
Patient ID: Alexander BailiffJohn Munoz, male   DOB: 01-19-1961, 57 y.o.   MRN: [id number]  Pt currently presents with a depressed affect and guarded behavior. Pt affect brightens during interaction, smiles during interaction. Pt assertive but forwards little to Clinical research associatewriter. Pt reports good sleep with current medication regimen.   Pt provided with medications per providers orders. Pt's labs and vitals were monitored throughout the night. Pt given a 1:1 about emotional and mental status. Pt supported and encouraged to express concerns and questions. Pt educated on medications.  Pt's safety ensured with 15 minute and environmental checks. Pt currently denies SI/HI and A/V hallucinations. Pt verbally agrees to seek staff if SI/HI or A/VH occurs and to consult with staff before acting on any harmful thoughts. Will continue POC.

## 2017-11-26 NOTE — Tx Team (Signed)
Interdisciplinary Treatment and Diagnostic Plan Update  11/26/2017 Time of Session: 0830AM Alexander Munoz MRN: [id number]  Principal Diagnosis: MDD  Secondary Diagnoses: Active Problems:   MDD (major depressive disorder)   Current Medications:  Current Facility-Administered Medications  Medication Dose Route Frequency Provider Last Rate Last Dose  . acetaminophen (TYLENOL) tablet 650 mg  650 mg Oral Q6H PRN Okonkwo, Justina A, NP      . alum & mag hydroxide-simeth (MAALOX/MYLANTA) 200-200-20 MG/5ML suspension 30 mL  30 mL Oral Q4H PRN Okonkwo, Justina A, NP      . buPROPion (WELLBUTRIN XL) 24 hr tablet 300 mg  300 mg Oral Daily Okonkwo, Justina A, NP   300 mg at 11/26/17 0831  . hydrochlorothiazide (MICROZIDE) capsule 12.5 mg  12.5 mg Oral Daily Okonkwo, Justina A, NP   12.5 mg at 11/26/17 0831  . hydrOXYzine (ATARAX/VISTARIL) tablet 25 mg  25 mg Oral Q6H PRN Cobos, Rockey Situ, MD   25 mg at 11/25/17 2158  . lisinopril (PRINIVIL,ZESTRIL) tablet 20 mg  20 mg Oral Daily Okonkwo, Justina A, NP   20 mg at 11/26/17 0831  . magnesium hydroxide (MILK OF MAGNESIA) suspension 30 mL  30 mL Oral Daily PRN Okonkwo, Justina A, NP      . ramelteon (ROZEREM) tablet 8 mg  8 mg Oral QHS Cobos, Rockey Situ, MD   8 mg at 11/25/17 2156   PTA Medications: Medications Prior to Admission  Medication Sig Dispense Refill Last Dose  . acetaminophen (TYLENOL) 325 MG tablet Take 650 mg by mouth every 6 (six) hours as needed for mild pain or headache.     Marland Kitchen buPROPion (WELLBUTRIN XL) 300 MG 24 hr tablet Take 1 tablet (300 mg total) by mouth daily. For depression (Patient not taking: Reported on 06/18/2017) 30 tablet 0 Not Taking at Unknown time  . buPROPion (WELLBUTRIN XL) 300 MG 24 hr tablet Take 1 tablet (300 mg total) by mouth daily. 30 tablet 0   . hydrochlorothiazide (MICROZIDE) 12.5 MG capsule Take 1 capsule by mouth daily.     . hydrOXYzine (ATARAX/VISTARIL) 50 MG tablet Take 1 tablet (50 mg total) by mouth 3  (three) times daily as needed for anxiety. 30 tablet 0   . lisinopril (PRINIVIL,ZESTRIL) 20 MG tablet Take 1 tablet (20 mg total) by mouth daily. 30 tablet 0   . Melatonin 3 MG TABS Take 6 mg by mouth at bedtime.   06/17/2017 at Unknown time  . nicotine (NICODERM CQ - DOSED IN MG/24 HOURS) 21 mg/24hr patch Place 21 mg onto the skin.     . pantoprazole (PROTONIX) 40 MG tablet Take 1 tablet (40 mg total) by mouth daily. 30 tablet 0   . traZODone (DESYREL) 50 MG tablet Take 1 tablet (50 mg total) by mouth at bedtime as needed and may repeat dose one time if needed for sleep. 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   Physician Treatment Plan for Primary Diagnosis: MDD Long Term Goal(s): Improvement in symptoms so as ready for discharge Improvement in symptoms so as ready for discharge   Short Term Goals: Ability to identify changes in lifestyle to reduce recurrence of condition will improve Ability to maintain clinical measurements within normal limits will improve Ability to identify triggers associated  with substance abuse/mental health issues will improve  Medication Management: Evaluate patient's response, side effects, and tolerance of medication regimen.  Therapeutic Interventions: 1 to 1 sessions, Unit Group sessions and Medication administration.  Evaluation of Outcomes: Progressing  Physician Treatment Plan for Secondary Diagnosis: Active Problems:   MDD (major depressive disorder)  Long Term Goal(s): Improvement in symptoms so as ready for discharge Improvement in symptoms so as ready for discharge   Short Term Goals: Ability to identify changes in lifestyle to reduce recurrence of  condition will improve Ability to maintain clinical measurements within normal limits will improve Ability to identify triggers associated with substance abuse/mental health issues will improve     Medication Management: Evaluate patient's response, side effects, and tolerance of medication regimen.  Therapeutic Interventions: 1 to 1 sessions, Unit Group sessions and Medication administration.  Evaluation of Outcomes: Progressing   RN Treatment Plan for Primary Diagnosis: MDD Long Term Goal(s): Knowledge of disease and therapeutic regimen to maintain health will improve  Short Term Goals: Ability to remain free from injury will improve, Ability to disclose and discuss suicidal ideas and Ability to identify and develop effective coping behaviors will improve  Medication Management: RN will administer medications as ordered by provider, will assess and evaluate patient's response and provide education to patient for prescribed medication. RN will report any adverse and/or side effects to prescribing provider.  Therapeutic Interventions: 1 on 1 counseling sessions, Psychoeducation, Medication administration, Evaluate responses to treatment, Monitor vital signs and CBGs as ordered, Perform/monitor CIWA, COWS, AIMS and Fall Risk screenings as ordered, Perform wound care treatments as ordered.  Evaluation of Outcomes: Progressing   LCSW Treatment Plan for Primary Diagnosis: MDD Long Term Goal(s): Safe transition to appropriate next level of care at discharge, Engage patient in therapeutic group addressing interpersonal concerns.  Short Term Goals: Engage patient in aftercare planning with referrals and resources, Facilitate patient progression through stages of change regarding substance use diagnoses and concerns and Identify triggers associated with mental health/substance abuse issues  Therapeutic Interventions: Assess for all discharge needs, 1 to 1 time with Social worker, Explore  available resources and support systems, Assess for adequacy in community support network, Educate family and significant other(s) on suicide prevention, Complete Psychosocial Assessment, Interpersonal group therapy.  Evaluation of Outcomes: Progressing   Progress in Treatment: Attending groups: Yes. Participating in groups: Yes. Taking medication as prescribed: Yes. Toleration medication: Yes. Family/Significant other contact made: SPE completed with pt; pt declined to consent to family contact.  Patient understands diagnosis: Yes. Discussing patient identified problems/goals with staff: Yes. Medical problems stabilized or resolved: Yes. Denies suicidal/homicidal ideation: Yes. Issues/concerns per patient self-inventory: No. Other: n/a   New problem(s) identified: No, Describe:  n/a  New Short Term/Long Term Goal(s): detox, medication management for mood stabilization; elimination of SI thoughts; development of comprehensive mental wellness/sobriety plan.   Discharge Plan or Barriers: CSW assessing. Pt plans to return home and resume mental health follow-up at [facility] for outpatient care. Pt provided with AA/NA list and MHAG pamphlet for additional community resources.   Reason for Continuation of Hospitalization: Anxiety Depression Medication stabilization Suicidal ideation Withdrawal symptoms  Estimated Length of Stay: Wed, 11/28/17  Attendees: Patient: 11/26/2017 10:11 AM  Physician: Dr. Jama Flavors MD; Dr. Altamese [place] MD 11/26/2017 10:11 AM  Nursing: Foy Guadalaj[place] RN; Patrice RN 11/26/2017 10:11 AM  RN Care Manager: Onnie Boer CM 11/26/2017 10:11 AM  Social Worker: Trula Slade, LCSW 11/26/2017 10:11 AM  Recreational Therapist: x 11/26/2017 10:11 AM  Other:  Armandina StammerAgnes Nwoko NP; Hillery Jacksanika Lewis NP 11/26/2017 10:11 AM  Other:  11/26/2017 10:11 AM  Other: 11/26/2017 10:11 AM    Scribe for Treatment Team: Ledell PeoplesHeather N Smart, LCSW 11/26/2017 10:11 AM

## 2017-11-27 DIAGNOSIS — F1020 Alcohol dependence, uncomplicated: Secondary | ICD-10-CM

## 2017-11-27 LAB — T3, FREE: T3, Free: 3 pg/mL (ref 2.0–4.4)

## 2017-11-27 MED ORDER — HYDROXYZINE HCL 50 MG PO TABS: 50.0000 mg | ORAL_TABLET | Freq: Four times a day (QID) | ORAL | Status: DC | PRN

## 2017-11-27 MED ORDER — RAMELTEON 8 MG PO TABS: 8.0000 mg | ORAL_TABLET | Freq: Every day | ORAL | 0 refills | Status: DC

## 2017-11-27 MED ORDER — LISINOPRIL 20 MG PO TABS: 20.0000 mg | ORAL_TABLET | Freq: Every day | ORAL | 0 refills | Status: DC

## 2017-11-27 MED ORDER — HYDROXYZINE HCL 50 MG PO TABS: 50.0000 mg | ORAL_TABLET | Freq: Four times a day (QID) | ORAL | 0 refills | Status: DC | PRN

## 2017-11-27 MED ORDER — HYDROXYZINE HCL 25 MG PO TABS
25.0000 mg | ORAL_TABLET | Freq: Once | ORAL | Status: AC
  Administered 2017-11-27: 25 mg via ORAL
  Filled 2017-11-27: qty 1

## 2017-11-27 MED ORDER — ACETAMINOPHEN 325 MG PO TABS: 650.0000 mg | ORAL_TABLET | Freq: Four times a day (QID) | ORAL | Status: DC | PRN

## 2017-11-27 MED ORDER — PANTOPRAZOLE SODIUM 40 MG PO TBEC: 40.0000 mg | DELAYED_RELEASE_TABLET | Freq: Every day | ORAL | 0 refills | Status: DC

## 2017-11-27 MED ORDER — HYDROXYZINE HCL 25 MG PO TABS: 25.0000 mg | ORAL_TABLET | Freq: Four times a day (QID) | ORAL | Status: DC | PRN

## 2017-11-27 MED ORDER — HYDROCHLOROTHIAZIDE 12.5 MG PO CAPS: 12.5000 mg | ORAL_CAPSULE | Freq: Every day | ORAL | 0 refills | Status: DC

## 2017-11-27 MED ORDER — BUPROPION HCL ER (XL) 300 MG PO TB24: 300.0000 mg | ORAL_TABLET | Freq: Every day | ORAL | 0 refills | Status: DC

## 2017-11-27 NOTE — Discharge Summary (Signed)
Physician Discharge Summary Note  Patient:  Alexander Munoz is an 57 y.o., male MRN:  [id number] DOB:  Mar 05, 1961 Patient phone:  [phone] (home)  Patient address:   [address] [place] [address],   Total Time spent with patient: Greater than 30 minutes  Date of Admission:  11/24/2017  Date of Discharge: 11/27/2017  Reason for Admission: Worsening symptoms of depression triggering suicidal ideation with plans to drive off the road.  Principal Problem: MDD (major depressive disorder), recurrent severe, without psychosis [facility])  Discharge Diagnoses: Patient Active Problem List   Diagnosis Date Noted  . MDD (major depressive disorder), recurrent severe, without psychosis ([place]) [F33.2] 09/07/2015    Priority: High  . MDD (major depressive disorder) [F32.9] 11/24/2017  . PTSD (post-traumatic stress disorder) [F43.10] 12/23/2016  . Cocaine use disorder, severe, dependence ([place]) [F14.20] 08/31/2014  . Alcohol abuse [F10.10] 08/31/2014   Musculoskeletal: Strength & Muscle Tone: within normal limits Gait & Station: normal Patient leans: N/A  Psychiatric Specialty Exam: SEE SRA BY MD Physical Exam  Nursing note and vitals reviewed. Constitutional: He is oriented to person, place, and time. He appears well-developed and well-nourished.  HENT:  Head: Normocephalic.  Eyes: Pupils are equal, round, and reactive to light.  Neck: Normal range of motion.  Cardiovascular: Normal rate.  Respiratory: Effort normal.  GI: Soft.  Genitourinary:  Genitourinary Comments: Deferred  Musculoskeletal: Normal range of motion.  Neurological: He is alert and oriented to person, place, and time.  Skin: Skin is warm and dry.  Psychiatric: He has a normal mood and affect. His behavior is normal. Thought content normal.    Review of Systems  Constitutional: Negative.   HENT: Negative.   Eyes: Negative.   Respiratory: Negative.   Cardiovascular: Negative.   Gastrointestinal:  Negative.   Genitourinary: Negative.   Musculoskeletal: Negative.   Skin: Negative.   Neurological: Negative.   Endo/Heme/Allergies: Negative.   Psychiatric/Behavioral: Positive for depression (Stable ) and substance abuse (Hx. Cocaine & alcohol dependence). Negative for hallucinations, memory loss and suicidal ideas. The patient has insomnia (Stable). The patient is not nervous/anxious.   All other systems reviewed and are negative.   Blood pressure 133/88, pulse 70, temperature 97.7 F (36.5 C), temperature source Oral, resp. rate 18, SpO2 98 %.There is no height or weight on file to calculate BMI.  Have you used any form of tobacco in the last 30 days? (Cigarettes, Smokeless Tobacco, Cigars, and/or Pipes): No  Has this patient used any form of tobacco in the last 30 days? (Cigarettes, Smokeless Tobacco, Cigars, and/or Pipes): NA.  Past Medical History:  Past Medical History:  Diagnosis Date  . Anxiety   . Depression   . GERD (gastroesophageal reflux disease)   . Hypertension   . Lactose intolerance   . Migraines    History reviewed. No pertinent surgical history.  Family History:  Family History  Problem Relation Age of Onset  . Cancer Other   . Hypertension Other   . Heart attack Other    Social History:  Social History   Substance and Sexual Activity  Alcohol Use Not on file   Comment: Alcohol 3x's a week      Social History   Substance and Sexual Activity  Drug Use Not on file   Comment: Daily cocaine use     Social History   Socioeconomic History  . Marital status: Legally Separated    Spouse name: None  . Number of children: None  . Years  of education: None  . Highest education level: None  Social Needs  . Financial resource strain: None  . Food insecurity - worry: None  . Food insecurity - inability: None  . Transportation needs - medical: None  . Transportation needs - non-medical: None  Occupational History  . None  Tobacco Use  . Smoking  status: Current Every Day Smoker    Packs/day: 0.00    Years: 0.00    Pack years: 0.00  . Smokeless tobacco: Never Used  Substance and Sexual Activity  . Alcohol use: None    Comment: Alcohol 3x's a week   . Drug use: None    Comment: Daily cocaine use   . Sexual activity: None  Other Topics Concern  . None  Social History Narrative  . None   Risk to Self: Suicidal Ideation: Yes-Currently Present Suicidal Intent: Yes-Currently Present Is patient at risk for suicide?: Yes Suicidal Plan?: Yes-Currently Present Specify Current Suicidal Plan: run his car off the road Access to Means: Yes Specify Access to Suicidal Means: car What has been your use of drugs/alcohol within the last 12 months?: see Sa section How many times?: 1 Other Self Harm Risks: (none) Triggers for Past Attempts: Unpredictable Intentional Self Injurious Behavior: None  Risk to Others: Homicidal Ideation: No Thoughts of Harm to Others: No Current Homicidal Intent: No Current Homicidal Plan: No Access to Homicidal Means: No Assessment of Violence: None Noted Does patient have access to weapons?: No Criminal Charges Pending?: No Does patient have a court date: Nono   Prior Inpatient Therapy: Prior Inpatient Therapy: Yes Prior Therapy Dates: 2018 Prior Therapy Facilty/Provider(s): [facility] Reason for Treatment: Sa, depressionyes   Prior Outpatient Therapy: Prior Outpatient Therapy: Yes Prior Therapy Dates: ongoing Prior Therapy Facilty/Provider([facility]): [facility] Reason for Treatment: Depression, SA, PTSD Does patient have an ACCT team?: No Does patient have Intensive In-House Services?  : No Does patient have [facility] services? : No Does patient have P4CC services?: [place]  Level of Care:  OP  Hospital Course:  Patient is a 57 year old male, who presented to the hospital on 1/26 voluntarily. States he has been feeling more depressed, with suicidal ideations of driving off road. Endorses neuro-vegetative  symptoms as below.  Patient has a history of Cocaine Abuse, and states he has been using regularly, often in binges lasting 2-3 days. He also reports he had stopped his prescribed antidepressant (Wellbutrin) three weeks ago after leaving his medication behind during a visit to family. Of note, patient is known to our unit from prior admissions, most recently in August 2018 for worsening depression and SI: was diagnosed with MDD and Cocaine Use Disorder, discharged on Wellbutrin XL . Patient states he has been on Wellbutrin XL for several years, and states it has been well tolerated and helpful.  This is one of Alexander Munoz's several discharge summaries from this hospital alone. He was discharged from this hospital last August, 2018 with an appointment to continue mental health/substance abuse treatments at the [facility] medical center in [place], [place].  However, Alexander Munoz apparently did not follow-up care as recommended. He was re-admitted to the [facility] adult unit with complaints of worsening symptoms of depression triggering suicidal ideations with plan. He cited his recent relapse & continuous use of cocaine as the trigger. He was in need of mood stabilization treatments.   During the course of his hospital stay, Alexander Munoz was medicated & discharged on, Wellbutrin 300 mg for depression, Hydroxyzine 50 mg prn for anxiety,Nicotine & Rozerem  8 mg for insomnia. He was enrolled & participated in the group counseling sessions being offered & held on this unit. He was counseled & learned coping skills that should help him cope better & maintain mood stability after discharge. He was resumed on all his pertinent home medications for the other previously existing medical issues presented. He tolerated his treatment regimen without any adverse effects reported.   While his treatment was on going, Alexander Munoz improvement was monitored by observation & his daily reports of symptom reduction noted. His emotional & mental status were monitored by  daily self-inventory reports completed by her & the clinical staff. He was evaluated daily by the treatment team for mood stability & the need for continued recovery after discharge. He was offered further treatment options upon discharge & will follow up with the outpatient psychiatric services as listed below.     Upon discharge, Alexander Munoz was both mentally & medically stable. He is currently denying suicidal, homicidal ideation, auditory, visual/tactile hallucinations, delusional thoughts & or paranoia. He was encouraged to take his medications as recommended & follow-up with his outpatient psychiatric provider at the [facility]. He left [facility] with all personal belongings in no apparent distress. Transportation per family.  Consults:  psychiatry  Discharge Vitals:   Blood pressure 133/88, pulse 70, temperature 97.7 F (36.5 C), temperature source Oral, resp. rate 18, SpO2 98 %. There is no height or weight on file to calculate BMI.  Lab Results:   Results for orders placed or performed during the hospital encounter of 11/24/17 (from the past 72 hour(s))  CBC     Status: Abnormal   Collection Time: 11/25/17  6:30 AM  Result Value Ref Range   WBC 7.0 4.0 - 10.5 K/uL   RBC 5.05 4.22 - 5.81 MIL/uL   Hemoglobin 13.1 13.0 - 17.0 g/dL   HCT 39.7 39.0 - 52.0 %   MCV 78.6 78.0 - 100.0 fL   MCH 25.9 (L) 26.0 - 34.0 pg   MCHC 33.0 30.0 - 36.0 g/dL   RDW 17.0 (H) 11.5 - 15.5 %   Platelets 407 (H) 150 - 400 K/uL    Comment: Performed at [facility], [place] [address], [place], [place] [address]  Comprehensive metabolic panel     Status: Abnormal   Collection Time: 11/25/17  6:30 AM  Result Value Ref Range   Sodium 140 135 - 145 mmol/L   Potassium 3.9 3.5 - 5.1 mmol/L   Chloride 110 101 - 111 mmol/L   CO2 25 22 - 32 mmol/L   Glucose, Bld 114 (H) 65 - 99 mg/dL   BUN 15 6 - 20 mg/dL   Creatinine, Ser 1.47 (H) 0.61 - 1.24 mg/dL   Calcium 8.4 (L) 8.9 - 10.3 mg/dL    Total Protein 5.7 (L) 6.5 - 8.1 g/dL   Albumin 3.1 (L) 3.5 - 5.0 g/dL   AST 21 15 - 41 U/L   ALT 19 17 - 63 U/L   Alkaline Phosphatase 49 38 - 126 U/L   Total Bilirubin 0.3 0.3 - 1.2 mg/dL   GFR calc non Af Amer 51 (L) >60 mL/min   GFR calc Af Amer 59 (L) >60 mL/min    Comment: (NOTE) The eGFR has been calculated using the CKD EPI equation. This calculation has not been validated in all clinical situations. eGFR's persistently <60 mL/min signify possible Chronic Kidney Disease.    Anion gap 5 5 - 15    Comment: Performed  at [facility], [place] [address] [place], [place] [address]  Hemoglobin A1c     Status: Abnormal   Collection Time: 11/25/17  6:30 AM  Result Value Ref Range   Hgb A1c MFr Bld 6.2 (H) 4.8 - 5.6 %    Comment: (NOTE) Pre diabetes:          5.7%-6.4% Diabetes:              >6.4% Glycemic control for   <7.0% adults with diabetes    Mean Plasma Glucose 131.24 mg/dL    Comment: Performed at [facility] [address] [place], [place] [address]  Lipid panel     Status: None   Collection Time: 11/25/17  6:30 AM  Result Value Ref Range   Cholesterol 148 0 - 200 mg/dL   Triglycerides 136 <150 mg/dL   HDL 45 >40 mg/dL   Total CHOL/HDL Ratio 3.3 RATIO   VLDL 27 0 - 40 mg/dL   LDL Cholesterol 76 0 - 99 mg/dL    Comment:        Total Cholesterol/HDL:CHD Risk Coronary Heart Disease Risk Table                     Men   Women  1/2 Average Risk   3.4   3.3  Average Risk       5.0   4.4  2 X Average Risk   9.6   7.1  3 X Average Risk  23.4   11.0        Use the calculated Patient Ratio above and the CHD Risk Table to determine the patient's CHD Risk.        ATP III CLASSIFICATION (LDL):  <100     mg/dL   Optimal  100-129  mg/dL   Near or Above                    Optimal  130-159  mg/dL   Borderline  160-189  mg/dL   High  >190     mg/dL   Very High Performed at [place] [address] [place], [place]  [address]   TSH     Status: Abnormal   Collection Time: 11/25/17  6:30 AM  Result Value Ref Range   TSH 6.288 (H) 0.350 - 4.500 uIU/mL    Comment: Performed by a 3rd Generation assay with a functional sensitivity of <=0.01 uIU/mL. Performed at [facility], [place] [address], [place], [place] [address]   Bilirubin, direct     Status: Abnormal   Collection Time: 11/25/17  6:30 AM  Result Value Ref Range   Bilirubin, Direct <0.1 (L) 0.1 - 0.5 mg/dL    Comment: Performed at [facility], [place] [address], [place], [place] [address]  Urinalysis, Complete w Microscopic     Status: Abnormal   Collection Time: 11/25/17  9:15 AM  Result Value Ref Range   Color, Urine STRAW (A) YELLOW   APPearance CLEAR CLEAR   Specific Gravity, Urine 1.012 1.005 - 1.030   pH 6.0 5.0 - 8.0   Glucose, UA NEGATIVE NEGATIVE mg/dL   Hgb urine dipstick NEGATIVE NEGATIVE   Bilirubin Urine NEGATIVE NEGATIVE   Ketones, ur NEGATIVE NEGATIVE mg/dL   Protein, ur NEGATIVE NEGATIVE mg/dL   Nitrite NEGATIVE NEGATIVE   Leukocytes, UA NEGATIVE NEGATIVE   RBC / HPF 0-5 0 - 5 RBC/hpf   WBC, UA  0-5 0 - 5 WBC/hpf   Bacteria, UA NONE SEEN NONE SEEN   Squamous Epithelial / LPF 0-5 (A) NONE SEEN   Mucus PRESENT     Comment: Performed at [facility], [place] [address], [place], [place] [address]  Rapid urine drug screen (hospital performed)     Status: Abnormal   Collection Time: 11/25/17  9:16 AM  Result Value Ref Range   Opiates NONE DETECTED NONE DETECTED   Cocaine POSITIVE (A) NONE DETECTED   Benzodiazepines NONE DETECTED NONE DETECTED   Amphetamines NONE DETECTED NONE DETECTED   Tetrahydrocannabinol POSITIVE (A) NONE DETECTED   Barbiturates NONE DETECTED NONE DETECTED    Comment: (NOTE) DRUG SCREEN FOR MEDICAL PURPOSES ONLY.  IF CONFIRMATION IS NEEDED FOR ANY PURPOSE, NOTIFY LAB WITHIN 5 DAYS. LOWEST DETECTABLE LIMITS FOR URINE DRUG SCREEN Drug Class                      Cutoff (ng/mL) Amphetamine and metabolites    1000 Barbiturate and metabolites    200 Benzodiazepine                 606 Tricyclics and metabolites     300 Opiates and metabolites        300 Cocaine and metabolites        300 THC                            50 Performed at [facility], [place] [address] [place], [place] [address]   T3, free     Status: None   Collection Time: 11/26/17  6:29 AM  Result Value Ref Range   T3, Free 3.0 2.0 - 4.4 pg/mL    Comment: (NOTE) Performed At: [facility] [place], [place] [id number] Rush Farmer MD [id number] Performed at [facility], [place] [address], [place], [place] [address]   T4, free     Status: None   Collection Time: 11/26/17  6:29 AM  Result Value Ref Range   Free T4 0.97 0.61 - 1.12 ng/dL    Comment: (NOTE) Biotin ingestion may interfere with free T4 tests. If the results are inconsistent with the TSH level, previous test results, or the clinical presentation, then consider biotin interference. If needed, order repeat testing after stopping biotin. Performed at [facility], [place] [address], [place], [place] [address]    Physical Findings: AIMS: Facial and Oral Movements Muscles of Facial Expression: None, normal Lips and Perioral Area: None, normal Jaw: None, normal Tongue: None, normal,Extremity Movements Upper (arms, wrists, hands, fingers): None, normal Lower (legs, knees, ankles, toes): None, normal, Trunk Movements Neck, shoulders, hips: None, normal, Overall Severity Severity of abnormal movements (highest score from questions above): None, normal Incapacitation due to abnormal movements: None, normal Patient's awareness of abnormal movements (rate only patient's report): No Awareness, Dental Status Current problems with teeth and/or dentures?: No Does patient usually wear dentures?: No  CIWA:  CIWA-Ar Total: 7 COWS:  COWS Total Score:  0  See Psychiatric Specialty Exam and Suicide Risk Assessment completed by Attending Physician prior to discharge.  Discharge destination:  Home  Is patient on multiple antipsychotic therapies at discharge:  No   Has Patient had three or more failed trials of antipsychotic monotherapy by history:  No  Recommended Plan for Multiple Antipsychotic Therapies: NA  Allergies as of 11/27/2017  Reactions   Lactose Intolerance (gi) Other (See Comments)   Gi upset       Medication List    STOP taking these medications   Melatonin 3 MG Tabs   nicotine 21 mg/24hr patch Commonly known as:  NICODERM CQ - dosed in mg/24 hours   traZODone 50 MG tablet Commonly known as:  DESYREL     TAKE these medications     Indication  acetaminophen 325 MG tablet Commonly known as:  TYLENOL Take 2 tablets (650 mg total) by mouth every 6 (six) hours as needed for mild pain or headache.  Indication:  Fever, Pain   buPROPion 300 MG 24 hr tablet Commonly known as:  WELLBUTRIN XL Take 1 tablet (300 mg total) by mouth daily. For depression Start taking on:  11/28/2017 What changed:  Another medication with the same name was removed. Continue taking this medication, and follow the directions you see here.  Indication:  Major Depressive Disorder   hydrochlorothiazide 12.5 MG capsule Commonly known as:  MICROZIDE Take 1 capsule (12.5 mg total) by mouth daily. For hypertension Start taking on:  11/28/2017 What changed:  additional instructions  Indication:  High Blood Pressure Disorder   hydrOXYzine 50 MG tablet Commonly known as:  ATARAX/VISTARIL Take 1 tablet (50 mg total) by mouth every 6 (six) hours as needed for anxiety. What changed:  when to take this  Indication:  Feeling Anxious   lisinopril 20 MG tablet Commonly known as:  PRINIVIL,ZESTRIL Take 1 tablet (20 mg total) by mouth daily. For high blood[ressure Start taking on:  11/28/2017 What changed:  additional instructions  Indication:   High Blood Pressure Disorder   pantoprazole 40 MG tablet Commonly known as:  PROTONIX Take 1 tablet (40 mg total) by mouth daily. For acid reflux Start taking on:  11/28/2017 What changed:  additional instructions  Indication:  Gastroesophageal Reflux Disease   ramelteon 8 MG tablet Commonly known as:  ROZEREM Take 1 tablet (8 mg total) by mouth at bedtime. For sleep  Indication:  [facility] Follow up on 12/14/2017.   Why:  Medication management at 3:30PM on Friday, 2/15 with Dr. Onalee Hua. Contact information: [place] [address] [phone]          Follow-up recommendations: Activity:  As tolerated Diet: As recommended by your primary care doctor. Keep all scheduled follow-up appointments as recommended.   Comments: Patient is instructed prior to discharge to: Take all medications as prescribed by his/her mental healthcare provider. Report any adverse effects and or reactions from the medicines to his/her outpatient provider promptly. Patient has been instructed & cautioned: To not engage in alcohol and or illegal drug use while on prescription medicines. In the event of worsening symptoms, patient is instructed to call the crisis hotline, 911 and or go to the nearest ED for appropriate evaluation and treatment of symptoms. To follow-up with his/her primary care provider for your other medical issues, concerns and or health care needs.    Signed: Lindell Spar FNP, PMHNP- [facility] 11/27/2017, 9:59 AM   Patient seen, Suicide Assessment Completed.  Disposition Plan Reviewed   Iain Sawchuk is a 57 y/o M with history of MDD, PTSD, and cocaine abuse who was admitted with worsening depression, SI, poor medication adherence, and worsened use of cocaine. He was resumed on previous medication of wellbutrin, and he had gradual improvement of his mood symptoms.  Today  upon evaluation,  pt shares, "I feel like I need a few days to get back on track, and I got that." Pt reports his mood is feeling much better and he denies SI/HI/AH/VH. He did not sleep well last night, but overall he has been sleeping well during his admission. He is tolerating being restarted on previous medication, and he has no side effects. He is future motivated to follow up at the [place] and get involved with volunteering there again as well as substance use treatment groups. He was able to engage in safety planning including plan to return to [facility] or contact emergency services if he feels unable to maintain his own safety or the safety of others. Pt had no further questions, comments, or concerns.  Plan Of Care/Follow-up recommendations:   - Discharge to outpatient level of care  - MDD/PTSD             - Continue Wellbutrin 352m po daily.  -Anxiety             - Continue Hydroxyzine 223mpo prn Q 6 hrs.  -Insomnia             - Continue Rozerem 72m59mo Q hs.  - Continue all other medication regimen for the other pre-existing medical issues presented.  Activity:  as tolerated Diet:  normal Tests:  NA Other:  see above for DC plan  ChrPennelope BrackenD

## 2017-11-27 NOTE — Progress Notes (Signed)
Patient ID: Alexander Munoz, male   DOB: 08-Nov-1960, 57 y.o.   MRN: [id number] Patient observed to be calm/cooperative this morning, denies SI/HI/AVH, took all meds as scheduled, reports a good appetite, states he ate all of his breakfast, denies having any current concerns, Q15 minute safety checks in place, pt verbalizes readiness for discharge, will continue to monitor.

## 2017-11-27 NOTE — BHH Suicide Risk Assessment (Signed)
[facility]   Principal Problem: MDD (major depressive disorder), recurrent severe, without psychosis (HCC) Discharge Diagnoses:  Patient Active Problem List   Diagnosis Date Noted  . MDD (major depressive disorder) [F32.9] 11/24/2017  . PTSD (post-traumatic stress disorder) [F43.10] 12/23/2016  . MDD (major depressive disorder), recurrent severe, without psychosis (HCC) [F33.2] 09/07/2015  . Cocaine use disorder, severe, dependence (HCC) [F14.20] 08/31/2014  . Alcohol abuse [F10.10] 08/31/2014    Total Time spent with patient: 30 minutes  Musculoskeletal: Strength & Muscle Tone: within normal limits Gait & Station: normal Patient leans: N/A  Psychiatric Specialty Exam: Review of Systems  Constitutional: Negative for chills and fever.  Respiratory: Negative for cough and shortness of breath.   Cardiovascular: Negative for chest pain.  Gastrointestinal: Negative for abdominal pain, diarrhea, heartburn, nausea and vomiting.  Psychiatric/Behavioral: Negative for depression, hallucinations and suicidal ideas. The patient is not nervous/anxious.     Blood pressure 133/88, pulse 70, temperature 97.7 F (36.5 C), temperature source Oral, resp. rate 18, SpO2 98 %.There is no height or weight on file to calculate BMI.  General Appearance: Casual and Fairly Groomed  Patent attorneyye Contact::  Good  Speech:  Clear and Coherent and Normal Rate  Volume:  Normal  Mood:  Euthymic  Affect:  Appropriate and Congruent  Thought Process:  Coherent and Goal Directed  Orientation:  Full (Time, Place, and Person)  Thought Content:  Logical  Suicidal Thoughts:  No  Homicidal Thoughts:  No  Memory:  Immediate;   Good Recent;   Good Remote;   Good  Judgement:  Fair  Insight:  Good  Psychomotor Activity:  Normal  Concentration:  Fair  Recall:  FiservFair  Fund of Knowledge:Fair  Language: Fair  Akathisia:  No  Handed:    AIMS (if indicated):     Assets:  Communication  Skills Leisure Time Physical Health Resilience Social Support  Sleep:  Number of Hours: 6.5  Cognition: WNL  ADL's:  Intact   Mental Status Per Nursing Assessment::   On Admission:     Demographic Factors:  Male, Living alone and Unemployed  Loss Factors: NA  Historical Factors: Family history of mental illness or substance abuse and Impulsivity  Risk Reduction Factors:   Positive social support, Positive therapeutic relationship and Positive coping skills or problem solving skills  Continued Clinical Symptoms:  Depression:   Severe Alcohol/Substance Abuse/Dependencies  Cognitive Features That Contribute To Risk:  None    Suicide Risk:  Minimal: No identifiable suicidal ideation.  Patients presenting with no risk factors but with morbid ruminations; may be classified as minimal risk based on the severity of the depressive symptoms  Follow-up Information    [facility].   Why:  Medication management at 3:30PM on Friday, 2/15 with Dr. Marin Shutterastillo Nieves. Contact information: [address] [place] [address] [phone]         Subjective Data:  Alexander Munoz is a 57 y/o M with history of MDD, PTSD, and cocaine abuse who was admitted with worsening depression, SI, poor medication adherence, and worsened use of cocaine. He was resumed on previous medication of wellbutrin, and he had gradual improvement of his mood symptoms.  Today upon evaluation, pt shares, "I feel like I need a few days to get back on track, and I got that." Pt reports his mood is feeling much better and he denies SI/HI/AH/VH. He did not sleep well last night, but overall he has been sleeping  well during his admission. He is tolerating being restarted on previous medication, and he has no side effects. He is future motivated to follow up at the [place] and get involved with volunteering there again as well as substance use treatment groups. He was able  to engage in safety planning including plan to return to [facility] or contact emergency services if he feels unable to maintain his own safety or the safety of others. Pt had no further questions, comments, or concerns.   Plan Of Care/Follow-up recommendations:    - Discharge to outpatient level of care  - MDD/PTSD   - Continue Wellbutrin 300mg  po daily.  -Anxiety   - Continue Hydroxyzine 25mg  po prn Q 6 hrs.  -Insomnia   - Continue Rozerem 8mg  po Q hs.      - Continue all other medication regimen for the other pre-existing medical issues presented.  Activity:  as tolerated Diet:  normal Tests:  NA Other:  see above for DC plan  Micheal Likens, MD 11/27/2017, 10:18 AM

## 2017-11-27 NOTE — Progress Notes (Signed)
  [facility] Discharge [facility] :  Will you be returning to the same living situation after discharge:  Yes,  home At discharge, do you have transportation home?: Yes,  friend/family member Do you have the ability to pay for your medications: Yes,  [facility]  Release of information consent forms completed and submitted to medical records by CSW.  Patient to Follow up at: Follow-up Information    [facility].   Why:  Medication management at 3:30PM on Friday, 2/15 with Dr. Marin Shutterastillo Nieves. Contact information: [address] [place] [address] [phone]           Next level of care provider has access to [facility] Link:no  Safety Planning and Suicide Prevention discussed: Yes,  SPE completed with pt; pt declined to consent to family contact. SPI pamphlet and Mobile Crisis information provided.   Have you used any form of tobacco in the last 30 days? (Cigarettes, Smokeless Tobacco, Cigars, and/or Pipes): No  Has patient been referred to the Quitline?: N/A patient is not a smoker  Patient has been referred for addiction treatment: Yes  Pulte HomesHeather N Smart, LCSW 11/27/2017, 9:32 AM

## 2017-11-27 NOTE — Discharge Instructions (Signed)
Patient educated on his discharge instructions, verbalized understanding, [facility] [place].  Pt left unit with all of his belongings, discharge instructions and prescription scripts.

## 2018-04-21 ENCOUNTER — Other Ambulatory Visit: Payer: Self-pay

## 2018-04-21 ENCOUNTER — Encounter (HOSPITAL_COMMUNITY): Payer: Self-pay

## 2018-04-21 ENCOUNTER — Inpatient Hospital Stay (HOSPITAL_COMMUNITY)
Admission: RE | Admit: 2018-04-21 | Discharge: 2018-04-26 | DRG: 885 | Disposition: A | Discharge status: Home / Self Care | Payer: No Typology Code available for payment source | Attending: Psychiatry | Admitting: Psychiatry

## 2018-04-21 ENCOUNTER — Ambulatory Visit (HOSPITAL_COMMUNITY)
Admission: RE | Admit: 2018-04-21 | Discharge: 2018-04-21 | Disposition: A | Discharge status: Home / Self Care | Payer: Non-veteran care | Attending: Psychiatry | Admitting: Psychiatry

## 2018-04-21 DIAGNOSIS — F17200 Nicotine dependence, unspecified, uncomplicated: Secondary | ICD-10-CM | POA: Insufficient documentation

## 2018-04-21 DIAGNOSIS — R450 Nervousness: Secondary | ICD-10-CM | POA: Diagnosis not present

## 2018-04-21 DIAGNOSIS — F332 Major depressive disorder, recurrent severe without psychotic features: Secondary | ICD-10-CM | POA: Diagnosis present

## 2018-04-21 DIAGNOSIS — E876 Hypokalemia: Secondary | ICD-10-CM | POA: Diagnosis not present

## 2018-04-21 DIAGNOSIS — Z91410 Personal history of adult physical and sexual abuse: Secondary | ICD-10-CM | POA: Diagnosis not present

## 2018-04-21 DIAGNOSIS — R45851 Suicidal ideations: Secondary | ICD-10-CM | POA: Diagnosis present

## 2018-04-21 DIAGNOSIS — G43909 Migraine, unspecified, not intractable, without status migrainosus: Secondary | ICD-10-CM | POA: Insufficient documentation

## 2018-04-21 DIAGNOSIS — I10 Essential (primary) hypertension: Secondary | ICD-10-CM | POA: Insufficient documentation

## 2018-04-21 DIAGNOSIS — F1420 Cocaine dependence, uncomplicated: Secondary | ICD-10-CM | POA: Diagnosis present

## 2018-04-21 DIAGNOSIS — E739 Lactose intolerance, unspecified: Secondary | ICD-10-CM | POA: Diagnosis present

## 2018-04-21 DIAGNOSIS — F1410 Cocaine abuse, uncomplicated: Secondary | ICD-10-CM | POA: Diagnosis present

## 2018-04-21 DIAGNOSIS — Z91411 Personal history of adult psychological abuse: Secondary | ICD-10-CM | POA: Diagnosis not present

## 2018-04-21 DIAGNOSIS — F419 Anxiety disorder, unspecified: Secondary | ICD-10-CM | POA: Diagnosis not present

## 2018-04-21 DIAGNOSIS — Z915 Personal history of self-harm: Secondary | ICD-10-CM | POA: Diagnosis not present

## 2018-04-21 DIAGNOSIS — K219 Gastro-esophageal reflux disease without esophagitis: Secondary | ICD-10-CM | POA: Diagnosis present

## 2018-04-21 DIAGNOSIS — G4700 Insomnia, unspecified: Secondary | ICD-10-CM | POA: Diagnosis present

## 2018-04-21 DIAGNOSIS — F329 Major depressive disorder, single episode, unspecified: Secondary | ICD-10-CM | POA: Diagnosis not present

## 2018-04-21 DIAGNOSIS — F17210 Nicotine dependence, cigarettes, uncomplicated: Secondary | ICD-10-CM | POA: Diagnosis not present

## 2018-04-21 DIAGNOSIS — F4310 Post-traumatic stress disorder, unspecified: Secondary | ICD-10-CM | POA: Diagnosis present

## 2018-04-21 DIAGNOSIS — Z79899 Other long term (current) drug therapy: Secondary | ICD-10-CM | POA: Diagnosis not present

## 2018-04-21 MED ORDER — MAGNESIUM HYDROXIDE 400 MG/5ML PO SUSP: 30.0000 mL | Freq: Every day | ORAL | Status: DC | PRN

## 2018-04-21 MED ORDER — ACETAMINOPHEN 325 MG PO TABS: 650.0000 mg | ORAL_TABLET | Freq: Four times a day (QID) | ORAL | Status: DC | PRN

## 2018-04-21 MED ORDER — ALUM & MAG HYDROXIDE-SIMETH 200-200-20 MG/5ML PO SUSP: 30.0000 mL | ORAL | Status: DC | PRN

## 2018-04-21 MED ORDER — HYDROXYZINE HCL 25 MG PO TABS
25.0000 mg | ORAL_TABLET | Freq: Three times a day (TID) | ORAL | Status: DC | PRN
  Administered 2018-04-21: 25 mg via ORAL
  Filled 2018-04-21: qty 1
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
## 2018-04-21 NOTE — Progress Notes (Signed)
Patient ID: Alexander BailiffJohn Munoz, male   DOB: 22-Oct-1961, 57 y.o.   MRN: [id number] D: Patient calm and cooperative on approach. Pt reports feeling okay. Pt mood and affect appears depressed and anxious. Pt reports [facility] and [facility] but he was not taking them as prescribed. Pt attended evening AA group and engaged in discussions. Denies  SI/HI/AVH and pain.No behavioral issues noted.  A: Support and encouragement offered as needed to express needs. Medications administered as prescribed.  R: Patient is safe and cooperative on unit. Will continue to monitor  for safety and stability.

## 2018-04-21 NOTE — Tx Team (Signed)
Initial Treatment Plan 04/21/2018 7:55 PM Alexander Munoz UJW:[id number]    PATIENT STRESSORS: Substance abuse Other: PTSD   PATIENT STRENGTHS: Ability for insight Average or above average intelligence   PATIENT IDENTIFIED PROBLEMS:   "PTSD"    "I want to get into a program"    "my emotional state"           DISCHARGE CRITERIA:  Improved stabilization in mood, thinking, and/or behavior Motivation to continue treatment in a less acute level of care Reduction of life-threatening or endangering symptoms to within safe limits  PRELIMINARY DISCHARGE PLAN: Outpatient therapy Return to previous living arrangement  PATIENT/FAMILY INVOLVEMENT: This treatment plan has been presented to and reviewed with the patient, Alexander Munoz  The patient has been given the opportunity to ask questions and make suggestions.  Shela NevinValerie S Teodor Prater, RN 04/21/2018, 7:55 PM

## 2018-04-22 DIAGNOSIS — F4310 Post-traumatic stress disorder, unspecified: Secondary | ICD-10-CM

## 2018-04-22 DIAGNOSIS — F1420 Cocaine dependence, uncomplicated: Secondary | ICD-10-CM

## 2018-04-22 DIAGNOSIS — F332 Major depressive disorder, recurrent severe without psychotic features: Principal | ICD-10-CM

## 2018-04-22 DIAGNOSIS — R45851 Suicidal ideations: Secondary | ICD-10-CM

## 2018-04-22 DIAGNOSIS — Z79899 Other long term (current) drug therapy: Secondary | ICD-10-CM

## 2018-04-22 DIAGNOSIS — R450 Nervousness: Secondary | ICD-10-CM

## 2018-04-22 DIAGNOSIS — G4700 Insomnia, unspecified: Secondary | ICD-10-CM

## 2018-04-22 LAB — RAPID URINE DRUG SCREEN, HOSP PERFORMED
Amphetamines: NOT DETECTED
Benzodiazepines: NOT DETECTED
Cocaine: POSITIVE — AB
Opiates: NOT DETECTED
Tetrahydrocannabinol: NOT DETECTED

## 2018-04-22 LAB — URINALYSIS, ROUTINE W REFLEX MICROSCOPIC
BILIRUBIN URINE: NEGATIVE
GLUCOSE, UA: NEGATIVE mg/dL
Hgb urine dipstick: NEGATIVE
KETONES UR: NEGATIVE mg/dL
Leukocytes, UA: NEGATIVE
Nitrite: NEGATIVE
PH: 5 (ref 5.0–8.0)
PROTEIN: NEGATIVE mg/dL
Specific Gravity, Urine: 1.025 (ref 1.005–1.030)

## 2018-04-22 LAB — COMPREHENSIVE METABOLIC PANEL
ALK PHOS: 58 U/L (ref 38–126)
ALT: 18 U/L (ref 17–63)
AST: 19 U/L (ref 15–41)
Albumin: 3.2 g/dL — ABNORMAL LOW (ref 3.5–5.0)
Anion gap: 7 (ref 5–15)
BUN: 16 mg/dL (ref 6–20)
CHLORIDE: 109 mmol/L (ref 101–111)
CO2: 24 mmol/L (ref 22–32)
CREATININE: 1.34 mg/dL — AB (ref 0.61–1.24)
Calcium: 8.5 mg/dL — ABNORMAL LOW (ref 8.9–10.3)
GFR, EST NON AFRICAN AMERICAN: 57 mL/min — AB (ref >60)
Glucose, Bld: 103 mg/dL — ABNORMAL HIGH (ref 65–99)
Potassium: 3.4 mmol/L — ABNORMAL LOW (ref 3.5–5.1)
Sodium: 140 mmol/L (ref 135–145)
Total Bilirubin: 0.1 mg/dL — ABNORMAL LOW (ref 0.3–1.2)
Total Protein: 5.6 g/dL — ABNORMAL LOW (ref 6.5–8.1)

## 2018-04-22 LAB — CBC
HCT: 39.2 % (ref 39.0–52.0)
Hemoglobin: 12.6 g/dL — ABNORMAL LOW (ref 13.0–17.0)
MCH: 25.5 pg — AB (ref 26.0–34.0)
MCHC: 32.1 g/dL (ref 30.0–36.0)
MCV: 79.2 fL (ref 78.0–100.0)
PLATELETS: 432 10*3/uL — AB (ref 150–400)
RBC: 4.95 MIL/uL (ref 4.22–5.81)
RDW: 16.1 % — ABNORMAL HIGH (ref 11.5–15.5)
WBC: 5.8 10*3/uL (ref 4.0–10.5)

## 2018-04-22 LAB — LIPID PANEL
Cholesterol: 142 mg/dL (ref 0–200)
HDL: 37 mg/dL — AB (ref >40)
LDL CALC: 73 mg/dL (ref 0–99)
TRIGLYCERIDES: 159 mg/dL — AB (ref <150)
Total CHOL/HDL Ratio: 3.8 RATIO
VLDL: 32 mg/dL (ref 0–40)

## 2018-04-22 LAB — ETHANOL: Alcohol, Ethyl (B): <10 mg/dL (ref <10)

## 2018-04-22 LAB — TSH: TSH: 3.052 u[IU]/mL (ref 0.350–4.500)

## 2018-04-22 LAB — HEMOGLOBIN A1C
HEMOGLOBIN A1C: 5.9 % — AB (ref 4.8–5.6)
MEAN PLASMA GLUCOSE: 122.63 mg/dL

## 2018-04-22 MED ORDER — HYDROXYZINE HCL 50 MG PO TABS
50.0000 mg | ORAL_TABLET | Freq: Four times a day (QID) | ORAL | Status: DC | PRN
  Administered 2018-04-22 – 2018-04-25 (×4): 50 mg via ORAL
  Filled 2018-04-22 (×4): qty 1

## 2018-04-22 MED ORDER — PANTOPRAZOLE SODIUM 40 MG PO TBEC
40.0000 mg | DELAYED_RELEASE_TABLET | Freq: Every day | ORAL | Status: DC
  Administered 2018-04-22 – 2018-04-26 (×5): 40 mg via ORAL
  Filled 2018-04-22 (×6): qty 1

## 2018-04-22 MED ORDER — HYDROCHLOROTHIAZIDE 12.5 MG PO CAPS
12.5000 mg | ORAL_CAPSULE | Freq: Every day | ORAL | Status: DC
  Administered 2018-04-22 – 2018-04-26 (×5): 12.5 mg via ORAL
  Filled 2018-04-22 (×6): qty 1

## 2018-04-22 MED ORDER — POTASSIUM CHLORIDE CRYS ER 20 MEQ PO TBCR
20.0000 meq | EXTENDED_RELEASE_TABLET | Freq: Two times a day (BID) | ORAL | Status: AC
  Administered 2018-04-22 – 2018-04-23 (×4): 20 meq via ORAL
  Filled 2018-04-22 (×4): qty 1

## 2018-04-22 MED ORDER — MIRTAZAPINE 15 MG PO TABS
15.0000 mg | ORAL_TABLET | Freq: Every day | ORAL | Status: DC
  Administered 2018-04-22 – 2018-04-25 (×4): 15 mg via ORAL
  Filled 2018-04-22 (×6): qty 1

## 2018-04-22 MED ORDER — LISINOPRIL 20 MG PO TABS
20.0000 mg | ORAL_TABLET | Freq: Every day | ORAL | Status: DC
  Administered 2018-04-22 – 2018-04-26 (×5): 20 mg via ORAL
  Filled 2018-04-22 (×6): qty 1

## 2018-04-22 MED ORDER — RAMELTEON 8 MG PO TABS
8.0000 mg | ORAL_TABLET | Freq: Every day | ORAL | Status: DC
  Administered 2018-04-22 – 2018-04-25 (×4): 8 mg via ORAL
  Filled 2018-04-22 (×6): qty 1

## 2018-04-22 MED ORDER — BUPROPION HCL ER (XL) 300 MG PO TB24
300.0000 mg | ORAL_TABLET | Freq: Every day | ORAL | Status: DC
  Administered 2018-04-22 – 2018-04-26 (×5): 300 mg via ORAL
  Filled 2018-04-22 (×6): qty 1
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
## 2018-04-22 NOTE — BHH Suicide Risk Assessment (Signed)
[facility] INPATIENT:  Family/Significant Other Suicide Prevention Education  Suicide Prevention Education:  Patient Refusal for Family/Significant Other Suicide Prevention Education: The patient Alexander BailiffJohn Shanks has refused to provide written consent for family/significant other to be provided Family/Significant Other Suicide Prevention Education during admission and/or prior to discharge.  Physician notified.  SPE completed with pt, as pt refused to consent to family contact. SPI pamphlet provided to pt and pt was encouraged to share information with support network, ask questions, and talk about any concerns relating to SPE. Pt denies access to guns/firearms and verbalized understanding of information provided. Mobile Crisis information also provided to pt.   Rona RavensHeather S Herberto Ledwell LCSW 04/22/2018, 11:24 AM

## 2018-04-22 NOTE — [facility] Group Notes (Signed)
LCSW Group Therapy Note   04/22/2018 1:15pm   Type of Therapy and Topic:  Group Therapy:  Overcoming Obstacles   Participation Level:  Did Not Attend--pt invited. Chose to remain in bed.    Description of Group:    In this group patients will be encouraged to explore what they see as obstacles to their own wellness and recovery. They will be guided to discuss their thoughts, feelings, and behaviors related to these obstacles. The group will process together ways to cope with barriers, with attention given to specific choices patients can make. Each patient will be challenged to identify changes they are motivated to make in order to overcome their obstacles. This group will be process-oriented, with patients participating in exploration of their own experiences as well as giving and receiving support and challenge from other group members.   Therapeutic Goals: 1. Patient will identify personal and current obstacles as they relate to admission. 2. Patient will identify barriers that currently interfere with their wellness or overcoming obstacles.  3. Patient will identify feelings, thought process and behaviors related to these barriers. 4. Patient will identify two changes they are willing to make to overcome these obstacles:      Summary of Patient Progress x     Therapeutic Modalities:   Cognitive Behavioral Therapy Solution Focused Therapy Motivational Interviewing Relapse Prevention Therapy  Rona RavensHeather S Dariusz Brase, LCSW 04/22/2018 2:52 PM
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
## 2018-04-22 NOTE — Progress Notes (Signed)
Recreation Therapy Notes  Date: 6.24.19 Time: 0930 Location: [facility] Hall Dayroom  Group Topic: Stress Management  Goal Area(s) Addresses:  Patient will verbalize importance of using healthy stress management.  Patient will identify positive emotions associated with healthy stress management.   Intervention: Stress Management  Activity :  Guided Imagery.  LRT introduced the stress management technique of guided imagery to the group.  LRT read a script that took the patients on a journey to the beach.  Patients were to follow along as the script was read to engage in the activity.  Education:  Stress Management, Discharge Planning.   Education Outcome: Acknowledges edcuation/In group clarification offered/Needs additional education  Clinical Observations/Feedback: Pt did not attend group.      Caroll RancherMarjette Lakira Ogando, LRT/CTRS    Lillia AbedLindsay, Rynn Markiewicz A 04/22/2018 11:21 AM

## 2018-04-22 NOTE — Tx Team (Signed)
Interdisciplinary Treatment and Diagnostic Plan Update  04/22/2018 Time of Session: 0830AM Alexander Munoz MRN: [id number]  Principal Diagnosis: MDD, recurrent, severe  Secondary Diagnoses: Active Problems:   MDD (major depressive disorder), recurrent episode, severe (HCC)   Current Medications:  Current Facility-Administered Medications  Medication Dose Route Frequency Provider Last Rate Last Dose  . acetaminophen (TYLENOL) tablet 650 mg  650 mg Oral Q6H PRN Truman Hayward, FNP      . alum & mag hydroxide-simeth (MAALOX/MYLANTA) 200-200-20 MG/5ML suspension 30 mL  30 mL Oral Q4H PRN Starkes, Takia S, FNP      . buPROPion (WELLBUTRIN XL) 24 hr tablet 300 mg  300 mg Oral Daily Lewis, Tanika N, NP      . hydrochlorothiazide (MICROZIDE) capsule 12.5 mg  12.5 mg Oral Daily Oneta Rack, NP      . hydrOXYzine (ATARAX/VISTARIL) tablet 25 mg  25 mg Oral TID PRN Truman Hayward, FNP   25 mg at 04/21/18 2137  . lisinopril (PRINIVIL,ZESTRIL) tablet 20 mg  20 mg Oral Daily Lewis, Tanika N, NP      . magnesium hydroxide (MILK OF MAGNESIA) suspension 30 mL  30 mL Oral Daily PRN Truman Hayward, FNP      . potassium chloride SA (K-DUR,KLOR-CON) CR tablet 20 mEq  20 mEq Oral BID Oneta Rack, NP      . ramelteon (ROZEREM) tablet 8 mg  8 mg Oral QHS Oneta Rack, NP       PTA Medications: Medications Prior to Admission  Medication Sig Dispense Refill Last Dose  . acetaminophen (TYLENOL) 325 MG tablet Take 2 tablets (650 mg total) by mouth every 6 (six) hours as needed for mild pain or headache.     Marland Kitchen buPROPion (WELLBUTRIN XL) 300 MG 24 hr tablet Take 1 tablet (300 mg total) by mouth daily. For depression 30 tablet 0   . hydrochlorothiazide (MICROZIDE) 12.5 MG capsule Take 1 capsule (12.5 mg total) by mouth daily. For hypertension 30 capsule 0   . hydrOXYzine (ATARAX/VISTARIL) 50 MG tablet Take 1 tablet (50 mg total) by mouth every 6 (six) hours as needed for anxiety. 60 tablet 0   .  lisinopril (PRINIVIL,ZESTRIL) 20 MG tablet Take 1 tablet (20 mg total) by mouth daily. For high blood[ressure 30 tablet 0   . pantoprazole (PROTONIX) 40 MG tablet Take 1 tablet (40 mg total) by mouth daily. For acid reflux 15 tablet 0   . ramelteon (ROZEREM) 8 MG tablet Take 1 tablet (8 mg total) by mouth at bedtime. For sleep 15 tablet 0     Patient Stressors: Substance abuse Other: PTSD  Patient Strengths: Ability for insight Average or above average intelligence  Treatment Modalities: Medication Management, Group therapy, Case management,  1 to 1 session with clinician, Psychoeducation, Recreational therapy.   Physician Treatment Plan for Primary Diagnosis:MDD, recurrent, severe Long Term Goal(s): Improvement in symptoms so as ready for discharge Improvement in symptoms so as ready for discharge   Short Term Goals: Ability to identify changes in lifestyle to reduce recurrence of condition will improve Ability to maintain clinical measurements within normal limits will improve Ability to identify triggers associated with substance abuse/mental health issues will improve  Medication Management: Evaluate patient's response, side effects, and tolerance of medication regimen.  Therapeutic Interventions: 1 to 1 sessions, Unit Group sessions and Medication administration.  Evaluation of Outcomes: Progressing  Physician Treatment Plan for Secondary Diagnosis: Active Problems:   MDD (major depressive disorder), recurrent episode,  severe (HCC)  Long Term Goal(s): Improvement in symptoms so as ready for discharge Improvement in symptoms so as ready for discharge   Short Term Goals: Ability to identify changes in lifestyle to reduce recurrence of condition will improve Ability to maintain clinical measurements within normal limits will improve Ability to identify triggers associated with substance abuse/mental health issues will improve     Medication Management: Evaluate patient's  response, side effects, and tolerance of medication regimen.  Therapeutic Interventions: 1 to 1 sessions, Unit Group sessions and Medication administration.  Evaluation of Outcomes: Progressing   RN Treatment Plan for Primary Diagnosis:MDD, recurrent, severe Long Term Goal(s): Knowledge of disease and therapeutic regimen to maintain health will improve  Short Term Goals: Ability to remain free from injury will improve, Ability to demonstrate self-control and Ability to verbalize feelings will improve  Medication Management: RN will administer medications as ordered by provider, will assess and evaluate patient's response and provide education to patient for prescribed medication. RN will report any adverse and/or side effects to prescribing provider.  Therapeutic Interventions: 1 on 1 counseling sessions, Psychoeducation, Medication administration, Evaluate responses to treatment, Monitor vital signs and CBGs as ordered, Perform/monitor CIWA, COWS, AIMS and Fall Risk screenings as ordered, Perform wound care treatments as ordered.  Evaluation of Outcomes: Progressing   LCSW Treatment Plan for Primary Diagnosis: MDD, recurrent, severe Long Term Goal(s): Safe transition to appropriate next level of care at discharge, Engage patient in therapeutic group addressing interpersonal concerns.  Short Term Goals: Engage patient in aftercare planning with referrals and resources, Facilitate patient progression through stages of change regarding substance use diagnoses and concerns and Identify triggers associated with mental health/substance abuse issues  Therapeutic Interventions: Assess for all discharge needs, 1 to 1 time with Social worker, Explore available resources and support systems, Assess for adequacy in community support network, Educate family and significant other(s) on suicide prevention, Complete Psychosocial Assessment, Interpersonal group therapy.  Evaluation of Outcomes:  Progressing   Progress in Treatment: Attending groups: No. Participating in groups: No. New to unit. Continuing to assess.  Taking medication as prescribed: Yes. Toleration medication: Yes. Family/Significant other contact made: No, will contact:  family member if pt consents to collateral contact.  Patient understands diagnosis: Yes. Discussing patient identified problems/goals with staff: Yes. Medical problems stabilized or resolved: Yes. Denies suicidal/homicidal ideation: Yes. Issues/concerns per patient self-inventory: No. Other: n/a   New problem(s) identified: No, Describe:  n/a  New Short Term/Long Term Goal(s): detox, medication management for mood stabilization; elimination of SI thoughts; development of comprehensive mental wellness/sobriety plan.   Patient Goals:  "[facility] with my emotions."   Discharge Plan or Barriers: CSW assessing. [facility]. [facility] pamphlet, [facility] information, and AA/NA information provided to patient for additional community support and resources.   Reason for Continuation of Hospitalization: Anxiety Depression Medication stabilization Withdrawal symptoms  Estimated Length of Stay: Thursday, 04/25/18  Attendees: Patient: Alexander BailiffJohn Munoz 04/22/2018 9:54 AM  Physician: Dr. Jola Babinskilary MD; Dr. Altamese Carolinaainville MD 04/22/2018 9:54 AM  Nursing: Marcelino DusterMichelle RN; Huntley DecSara RN 04/22/2018 9:54 AM  RN Care Manager:x 04/22/2018 9:54 AM  Social Worker: Corrie MckusickHeather Kinney Sackmann LCSW 04/22/2018 9:54 AM  Recreational Therapist: x 04/22/2018 9:54 AM  Other: Armandina StammerAgnes Nwoko NP; Hillery Jacksanika Lewis NP 04/22/2018 9:54 AM  Other:  04/22/2018 9:54 AM  Other: 04/22/2018 9:54 AM  Scribe for Treatment Team: Rona Ravens, Alexander Mt 04/22/2018 9:54 AM

## 2018-04-22 NOTE — Progress Notes (Signed)
Patient ID: Alexander BailiffJohn Munoz, male   DOB: Apr 27, 1961, 57 y.o.   MRN: [id number] D) Pt has been appropriate and cooperative on approach. Pt has been positive for all unit activities with minimal prompting. Active in the milieu. Pt denies c/o pain. Contracts for safety. Med compliant. A) level 3 obs for safety, support and encouragement provided. Med ed reinforced. R) Cooperative.

## 2018-04-22 NOTE — Progress Notes (Signed)
Adult Psychoeducational Group Note  Date:  04/22/2018 Time:  9:00 AM  Group Topic/Focus:  Orientation:   The focus of this group is to educate the patient on the purpose and policies of crisis stabilization and provide a format to answer questions about their admission.  The group details unit policies and expectations of patients while admitted.  Participation Level:  Active  Participation Quality:  Appropriate  Affect:  Appropriate  Cognitive:  Appropriate  Insight: Appropriate  Engagement in Group:  Engaged  Modes of Intervention:  Discussion  Additional Comments:  Pt attended group. Pt talked about their goal, Pt talked about the schedule, Introduced the staff, talked about expectations and talked about being supportive to one another.  Wyn ForsterLeonard B Dwyne Hasegawa 04/22/2018, 9:00 AM

## 2018-04-23 DIAGNOSIS — F17210 Nicotine dependence, cigarettes, uncomplicated: Secondary | ICD-10-CM

## 2018-04-23 NOTE — Progress Notes (Signed)
Pt did not attend goals and orientation group this morning  

## 2018-04-23 NOTE — Progress Notes (Signed)
Recreation Therapy Notes  Animal-Assisted Activity (AAA) Program Checklist/Progress Notes Patient Eligibility Criteria Checklist & Daily Group note for Rec Tx Intervention  Date: 6.25.19 Time: 1430 [address]   AAA/T Program Assumption of Risk Form signed by Engineer, productionatient/ or Parent Legal Guardian  YES   Patient is free of allergies or sever asthma  YES  Patient reports no fear of animals YES   Patient reports no history of cruelty to animals YES   Patient understands his/her participation is voluntary YES   Patient washes hands before animal contact YES   Patient washes hands after animal contact YES   Education: Charity fundraiserHand Washing, Appropriate Animal Interaction   Education Outcome: Acknowledges understanding/In group clarification offered/Needs additional education.   Clinical Observations/Feedback: Pt did not attend group.    Caroll RancherMarjette Collins Dimaria, LRT/CTRS         Caroll RancherLindsay, Lexa Coronado A 04/23/2018 4:01 PM

## 2018-04-23 NOTE — Progress Notes (Signed)
D: Patient is visible in the milieu.  He presents with flat affect.  He continues to report depressive symptoms, however, does not endorse thoughts of self harm today.  [facility] [place] this [facility].  He rates his depression as a 7; hopelessness and anxiety as a 4.  He is sleeping fair; his appetite is good.  Patient reports his energy level remains low and his concentration is good.  A: Continue to monitor medication management and MD orders.  Safety checks completed every 15 minutes per protocol.  Offer support and encouragement as needed.  R: Patient is receptive to staff; his behavior is appropriate.
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
## 2018-04-23 NOTE — Plan of Care (Signed)
  Problem: Education: Goal: Emotional status will improve Outcome: Progressing   Problem: Activity: Goal: Sleeping patterns will improve Outcome: Progressing   

## 2018-04-23 NOTE — Progress Notes (Signed)
Pt is seen in the dayroom, attending AA meeting. Pt was depressed/anxious in affect and mood. Pt brightens on approach. Pt denies SI/HI/AVH/Pain at this time. Pt states he has a phone interview for a long term treatment center [place] tomorrow. Pt states he hopes to get accepted. Pt is pleasant on the unit. Due for A.M. Labs. PRN vistaril requested and given. Will continue with POC.

## 2018-04-23 NOTE — [facility] Group Notes (Signed)
[facility] 04/23/2018 1:15pm  Type of Therapy: Mental Health Association Presentation  Participation Level: Active  Participation Quality: Attentive  Affect: Appropriate  Cognitive: Oriented  Insight: Developing/Improving  Engagement in Therapy: Engaged  Modes of Intervention: Discussion, Education and Socialization  Summary of Progress/Problems: Mental Health Association (MHA) Speaker came to talk about his personal journey with mental health. The pt processed ways by which to relate to the speaker. [name] provided handouts and educational information pertaining to groups and services offered by the [facility]. Pt was engaged in speaker's presentation and was receptive to resources provided.    Rona RavensHeather S Kylia Grajales, LCSW 04/23/2018 1:59 PM

## 2018-04-24 LAB — POTASSIUM: POTASSIUM: 4 mmol/L (ref 3.5–5.1)

## 2018-04-24 NOTE — Progress Notes (Signed)
Recreation Therapy Notes  Date: 6.26.19 Time: 0930 Location: 300 Hall Dayroom  Group Topic: Stress Management  Goal Area(s) Addresses:  Patient will verbalize importance of using healthy stress management.  Patient will identify positive emotions associated with healthy stress management.   Behavioral Response: Engaged  Intervention: Stress Management  Activity :  Body Scan Meditation.  LRT introduced the stress management technique of meditation.  LRT played meditation to allow patients to take note of any sensations or tensions they may be feeling.  Patients were to follow along as meditation played.  Education:  Stress Management, Discharge Planning.   Education Outcome: Acknowledges edcuation/In group clarification offered/Needs additional education  Clinical Observations/Feedback: Pt attended and participated in group session.     Caroll RancherMarjette Elroy Schembri, LRT/CTRS         Caroll RancherLindsay, Batsheva Stevick A 04/24/2018 12:04 PM

## 2018-04-24 NOTE — BHH Group Notes (Signed)
[facility] Group Notes:  (Nursing/MHT/Case Management/Adjunct)  Date:  04/24/2018  Time:  4:00 pm  Type of Therapy:  Psychoeducational Skills  Participation Level:  Did Not Attend  Participation Quality:    Affect:    Cognitive:    Insight:    Engagement in Group:    Modes of Intervention:    Summary of Progress/Problems:  Earline MayotteKnight, Jathan Balling Shephard 04/24/2018, 5:58 PM

## 2018-04-24 NOTE — Progress Notes (Signed)
Pt is seen in the dayroom, attending NA meeting. Pt was depressed/anxious in affect and mood. Pt brightens on approach. Pt denies SI/HI/AVH/Pain at this time. Pt states [place] attend [place] meetings once discharge.Pt is pleasant on the unit.PRN vistaril requested and given. Will continue with POC

## 2018-04-24 NOTE — Progress Notes (Signed)
Patient ID: Alexander BailiffJohn Munoz, male   DOB: Sep 07, 1961, 57 y.o.   MRN: [id number] LATE ENTRY FOR 04/21/18: PER STATE REGULATIONS 482.30  THIS CHART WAS REVIEWED FOR MEDICAL NECESSITY WITH RESPECT TO THE PATIENT'S ADMISSION/DURATION OF STAY.  NEXT REVIEW DATE:04/25/18  Loura HaltBARBARA Heber Hoog, RN, BSN CASE MANAGER

## 2018-04-24 NOTE — Progress Notes (Signed)
D: [facility] [place] [facility]  It is for a PTSD program for vets.  Patient is reluctant to attend groups due to his peers talking about drug abuse and jail.  He rates his depression as a 5; hopelessness as a 2; anxiety as a 4.  He denies any thoughts of self harm.  His goal is [facility]  He denies any significant withdrawal symptoms.  A: Continue to monitor medication management and MD orders. Safety checks completed every 15 minutes per protocol.  Offer support and encouragement as needed.  R: Patient is receptive to staff; his behavior is appropriate.

## 2018-04-24 NOTE — Therapy (Signed)
Occupational Therapy Group Treatment Note  Date:  04/24/2018 Time:  11:34 AM  Group Topic/Focus:  Stress Management  Participation Level:  Minimal  Participation Quality:  Resistant  Affect:  Depressed and Flat  Cognitive:  Appropriate  Insight: Improving  Engagement in Group:  Limited  Modes of Intervention:  Activity, Clarification, Discussion, Education and Socialization  Additional Comments:    S: No comments given by pt this date  O: Stress management group completed to use as productive coping strategy, to help mitigate maladaptive coping to integrate in functional BADL/IADL.Stress management tool worksheet discussed to educate on unhealthy vs healthy coping skills to manage stress to improve community integration. Coping strategies taught include: relaxation based- deep breathing, counting to 10, taking a 1 minute vacation, acceptance, stress balls, relaxation audio/video, visual/mental imagery. Positive mental attitude- gratitude, acceptance, cognitive reframing, positive self talk, anger management.    Coloring and relaxation guide handouts given at the end of the session.   A: Pt presents to group with flat and depressed affect. Pt acquired stress management tool worksheet, but left group half way through and did not return this date.  P: Pt provided with education on stress management activities to implement into daily routine. Handouts given to facilitate carryover when reintegrating into community        [facility]endezKaylee Wateen Munoz, [place]MSOT, OTR/L  AvnetKaylee Mesa Munoz 04/24/2018, 11:34 AM

## 2018-04-24 NOTE — Plan of Care (Signed)
  Problem: Education: Goal: Knowledge of [facility] Education information/materials will improve Outcome: Progressing Goal: Emotional status will improve Outcome: Progressing Goal: Mental status will improve Outcome: Progressing Goal: Verbalization of understanding the information provided will improve Outcome: Progressing   Problem: Activity: Goal: Interest or engagement in activities will improve Outcome: Progressing   
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
## 2018-04-25 NOTE — Progress Notes (Signed)
Patient ID: Alexander BailiffJohn Munoz, male   DOB: [birth year], 57 y.o.   MRN: [id number]   D: Patient denies SI/HI and auditory and visual hallucinations.Patient reports that he is doing well and is looking forward to discharge tomorrow. He has made plans for after discharge and has intentions to attend meetings since he was unable to get into a treatment center.  A: Patient given emotional support from RN. Patient given medications per MD orders. Patient encouraged to attend groups and unit activities. Patient encouraged to come to staff with any questions or concerns.  R: Patient remains cooperative and appropriate. Will continue to monitor patient for safety.

## 2018-04-25 NOTE — Progress Notes (Signed)
Pt shaved this evening with the presence of MHT.

## 2018-04-25 NOTE — Progress Notes (Signed)
Pt is seen in the dayroom, attending wrap-up group. Pt was depressed/anxious in affect and mood. Pt brightens on approach. Pt denies SI/HI/AVH/Pain at this time. Pt states he is focused on himself and his recovery. Pt has good insight and appears motivated for treatment. Pt is pleasant on the unit. No new c/o's. PRN vistaril requested and given. Will continue with POC.

## 2018-04-25 NOTE — [facility] Group Notes (Signed)
[facility] Group Notes:  (Nursing/MHT/Case Management/Adjunct)  Date:  04/25/2018  Time:  4:00 PM  Type of Therapy:  Psychoeducational Skills  Participation Level:  Active  Participation Quality:  Appropriate  Affect:  Appropriate  Cognitive:  Alert, Appropriate and Oriented  Insight:  Improving  Engagement in Group:  Developing/Improving  Modes of Intervention:  Activity  Summary of Progress/Problems: Patient attended group and participated in Bingo. Patient was pleasant, cooperative and interactive with peers.  Alexander Munoz [name] 04/25/2018, 5:00 PM 

## 2018-04-25 NOTE — Progress Notes (Signed)
[facility] Group Notes:  (Nursing/MHT/Case Management/Adjunct)  Date:  04/25/2018  Time:  2045  Type of Therapy:  wrap up group  Participation Level:  Active  Participation Quality:  Appropriate, Attentive, Sharing and Supportive  Affect:  Appropriate  Cognitive:  Appropriate  Insight:  Improving  Engagement in Group:  Engaged  Modes of Intervention:  Clarification, Education and Support  Summary of Progress/Problems:  Alexander Munoz, Alexander Munoz 04/25/2018, 10:51 PM
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
## 2018-04-25 NOTE — Progress Notes (Signed)
Adult Psychoeducational Group Note  Date:  04/25/2018 Time:  9:47 AM  Group Topic/Focus:  Orientation:   The focus of this group is to educate the patient on the purpose and policies of crisis stabilization and provide a format to answer questions about their admission.  The group details unit policies and expectations of patients while admitted.  Participation Level:  Active  Participation Quality:  Appropriate  Affect:  Appropriate  Cognitive:  Alert  Insight: Appropriate  Engagement in Group:  Engaged  Modes of Intervention:  Discussion and Education  Additional Comments:    Pt participated in group. Pt's goal today is to work on discharge planning.   Karren CobbleFizah G Brunella Wileman 04/25/2018, 9:47 AM

## 2018-04-25 NOTE — Progress Notes (Signed)
Patient ID: Alexander BailiffJohn Munoz, male   DOB: 10/14/1961, 57 y.o.   MRN: [id number] PER STATE REGULATIONS 482.30  THIS CHART WAS REVIEWED FOR MEDICAL NECESSITY WITH RESPECT TO THE PATIENT'S ADMISSION/DURATION OF STAY.  NEXT REVIEW DATE:04/29/18  Loura HaltBARBARA Niccolo Burggraf, RN, BSN CASE MANAGER

## 2018-04-26 MED ORDER — RAMELTEON 8 MG PO TABS: 8.0000 mg | ORAL_TABLET | Freq: Every day | ORAL | 0 refills | Status: DC

## 2018-04-26 MED ORDER — PANTOPRAZOLE SODIUM 40 MG PO TBEC: 40.0000 mg | DELAYED_RELEASE_TABLET | Freq: Every day | ORAL | 0 refills | Status: DC

## 2018-04-26 MED ORDER — BUPROPION HCL ER (XL) 300 MG PO TB24: 300.0000 mg | ORAL_TABLET | Freq: Every day | ORAL | 0 refills | Status: DC

## 2018-04-26 MED ORDER — LISINOPRIL 20 MG PO TABS: 20.0000 mg | ORAL_TABLET | Freq: Every day | ORAL | 0 refills | Status: DC

## 2018-04-26 MED ORDER — HYDROCHLOROTHIAZIDE 12.5 MG PO CAPS: 12.5000 mg | ORAL_CAPSULE | Freq: Every day | ORAL | 0 refills | Status: DC

## 2018-04-26 MED ORDER — MIRTAZAPINE 15 MG PO TABS: 15.0000 mg | ORAL_TABLET | Freq: Every day | ORAL | 0 refills | Status: DC

## 2018-04-26 MED ORDER — HYDROXYZINE HCL 50 MG PO TABS: 50.0000 mg | ORAL_TABLET | Freq: Four times a day (QID) | ORAL | 0 refills | Status: DC | PRN
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
## 2018-04-26 NOTE — Tx Team (Signed)
Interdisciplinary Treatment and Diagnostic Plan Update  04/26/2018 Time of Session: [id number] Alexander BailiffJohn Munoz MRN: [id number]  Principal Diagnosis: MDD, recurrent, severe  Secondary Diagnoses: Principal Problem:   MDD (major depressive disorder), recurrent episode, severe (HCC) Active Problems:   Cocaine use disorder, severe, dependence (HCC)   PTSD (post-traumatic stress disorder)   Current Medications:  No current facility-administered medications for this encounter.    Current Outpatient Medications  Medication Sig Dispense Refill  . [START ON 04/27/2018] buPROPion (WELLBUTRIN XL) 300 MG 24 hr tablet Take 1 tablet (300 mg total) by mouth daily. For depression 30 tablet 0  . [START ON 04/27/2018] hydrochlorothiazide (MICROZIDE) 12.5 MG capsule Take 1 capsule (12.5 mg total) by mouth daily. For high blood pressure 10 capsule 0  . hydrOXYzine (ATARAX/VISTARIL) 50 MG tablet Take 1 tablet (50 mg total) by mouth every 6 (six) hours as needed for anxiety. 75 tablet 0  . [START ON 04/27/2018] lisinopril (PRINIVIL,ZESTRIL) 20 MG tablet Take 1 tablet (20 mg total) by mouth daily. For high blood pressure 10 tablet 0  . mirtazapine (REMERON) 15 MG tablet Take 1 tablet (15 mg total) by mouth at bedtime. 30 tablet 0  . [START ON 04/27/2018] pantoprazole (PROTONIX) 40 MG tablet Take 1 tablet (40 mg total) by mouth daily. For acid reflux 10 tablet 0  . ramelteon (ROZEREM) 8 MG tablet Take 1 tablet (8 mg total) by mouth at bedtime. For sleep 30 tablet 0   PTA Medications: No medications prior to admission.    Patient Stressors: Substance abuse Other: PTSD  Patient Strengths: Ability for insight Average or above average intelligence  Treatment Modalities: Medication Management, Group therapy, Case management,  1 to 1 session with clinician, Psychoeducation, Recreational therapy.   Physician Treatment Plan for Primary Diagnosis:MDD, recurrent, severe Long Term Goal(s): Improvement in symptoms so as  ready for discharge Improvement in symptoms so as ready for discharge   Short Term Goals: Ability to identify changes in lifestyle to reduce recurrence of condition will improve Ability to maintain clinical measurements within normal limits will improve Ability to identify triggers associated with substance abuse/mental health issues will improve  Medication Management: Evaluate patient's response, side effects, and tolerance of medication regimen.  Therapeutic Interventions: 1 to 1 sessions, Unit Group sessions and Medication administration.  Evaluation of Outcomes: Adequate for Discharge  Physician Treatment Plan for Secondary Diagnosis: Principal Problem:   MDD (major depressive disorder), recurrent episode, severe (HCC) Active Problems:   Cocaine use disorder, severe, dependence (HCC)   PTSD (post-traumatic stress disorder)  Long Term Goal(s): Improvement in symptoms so as ready for discharge Improvement in symptoms so as ready for discharge   Short Term Goals: Ability to identify changes in lifestyle to reduce recurrence of condition will improve Ability to maintain clinical measurements within normal limits will improve Ability to identify triggers associated with substance abuse/mental health issues will improve     Medication Management: Evaluate patient's response, side effects, and tolerance of medication regimen.  Therapeutic Interventions: 1 to 1 sessions, Unit Group sessions and Medication administration.  Evaluation of Outcomes: Adequate for Discharge   RN Treatment Plan for Primary Diagnosis:MDD, recurrent, severe Long Term Goal(s): Knowledge of disease and therapeutic regimen to maintain health will improve  Short Term Goals: Ability to remain free from injury will improve, Ability to demonstrate self-control and Ability to verbalize feelings will improve  Medication Management: RN will administer medications as ordered by provider, will assess and evaluate  patient's response and provide education to patient  for prescribed medication. RN will report any adverse and/or side effects to prescribing provider.  Therapeutic Interventions: 1 on 1 counseling sessions, Psychoeducation, Medication administration, Evaluate responses to treatment, Monitor vital signs and CBGs as ordered, Perform/monitor CIWA, COWS, AIMS and Fall Risk screenings as ordered, Perform wound care treatments as ordered.  Evaluation of Outcomes: Adequate for Discharge   LCSW Treatment Plan for Primary Diagnosis: MDD, recurrent, severe Long Term Goal(s): Safe transition to appropriate next level of care at discharge, Engage patient in therapeutic group addressing interpersonal concerns.  Short Term Goals: Engage patient in aftercare planning with referrals and resources, Facilitate patient progression through stages of change regarding substance use diagnoses and concerns and Identify triggers associated with mental health/substance abuse issues  Therapeutic Interventions: Assess for all discharge needs, 1 to 1 time with Social worker, Explore available resources and support systems, Assess for adequacy in community support network, Educate family and significant other(s) on suicide prevention, Complete Psychosocial Assessment, Interpersonal group therapy.  Evaluation of Outcomes: Adequate for Discharge   Progress in Treatment: Attending groups: No. Participating in groups: No. New to unit. Continuing to assess.  Taking medication as prescribed: Yes. Toleration medication: Yes. Family/Significant other contact made: No, will contact:  family member if pt consents to collateral contact.  Patient understands diagnosis: Yes. Discussing patient identified problems/goals with staff: Yes. Medical problems stabilized or resolved: Yes. Denies suicidal/homicidal ideation: Yes. Issues/concerns per patient self-inventory: No. Other: n/a   New problem(s) identified: No, Describe:   n/a  New Short Term/Long Term Goal(s): detox, medication management for mood stabilization; elimination of SI thoughts; development of comprehensive mental wellness/sobriety plan.   Patient Goals:  "[facility] with [facility]."   Discharge Plan or Barriers: CSW assessing. [facility]. He goes to [place] [facility] for outpatient services. [facility] pamphlet, Mobile Crisis information, and AA/NA information provided to patient for additional community support and resources.   Reason for Continuation of Hospitalization: None   Estimated Length of Stay: Friday, 04/26/18  Attendees: Patient: Alexander Munoz 04/26/2018 4:12 PM  Physician: Dr. Jola Babinski MD; Dr. Altamese Edgewood MD; Dr. Nehemiah Massed 04/26/2018 4:12 PM  Nursing: Marcelino Duster RN; Huntley Dec RN 04/26/2018 4:12 PM  RN Care Manager:x 04/26/2018 4:12 PM  Social Worker: Corrie Mckusick LCSW; Baldo Daub, LCSWA 04/26/2018 4:12 PM  Recreational Therapist: x 04/26/2018 4:12 PM  Other: Armandina Stammer NP; Hillery Jacks NP 04/26/2018 4:12 PM  Other:  04/26/2018 4:12 PM  Other: 04/26/2018 4:12 PM    Scribe for Treatment Team: Maeola Sarah, LCSWA 04/26/2018 4:12 PM

## 2018-04-26 NOTE — Plan of Care (Signed)
  Problem: Education: Goal: Knowledge of [facility] Education information/materials will improve Outcome: Completed/Met Goal: Emotional status will improve Outcome: Completed/Met Goal: Mental status will improve Outcome: Completed/Met Goal: Verbalization of understanding the information provided will improve Outcome: Completed/Met   Problem: Activity: Goal: Interest or engagement in activities will improve Outcome: Completed/Met Goal: Sleeping patterns will improve Outcome: Completed/Met   Problem: Coping: Goal: Ability to verbalize frustrations and anger appropriately will improve Outcome: Completed/Met Goal: Ability to demonstrate self-control will improve Outcome: Completed/Met   Problem: Health Behavior/Discharge Planning: Goal: Identification of resources available to assist in meeting health care needs will improve Outcome: Completed/Met Goal: Compliance with treatment plan for underlying cause of condition will improve Outcome: Completed/Met   Problem: Physical Regulation: Goal: Ability to maintain clinical measurements within normal limits will improve Outcome: Completed/Met   Problem: Safety: Goal: Periods of time without injury will increase Outcome: Completed/Met   Problem: Education: Goal: Ability to make informed decisions regarding treatment will improve Outcome: Completed/Met   Problem: Coping: Goal: Coping ability will improve Outcome: Completed/Met   Problem: Health Behavior/Discharge Planning: Goal: Identification of resources available to assist in meeting health care needs will improve Outcome: Completed/Met   Problem: Medication: Goal: Compliance with prescribed medication regimen will improve Outcome: Completed/Met   Problem: Self-Concept: Goal: Ability to disclose and discuss suicidal ideas will improve Outcome: Completed/Met Goal: Will verbalize positive feelings about self Outcome: Completed/Met   Problem: Education: Goal:  Utilization of techniques to improve thought processes will improve Outcome: Completed/Met Goal: Knowledge of the prescribed therapeutic regimen will improve Outcome: Completed/Met   Problem: Activity: Goal: Interest or engagement in leisure activities will improve Outcome: Completed/Met Goal: Imbalance in normal sleep/wake cycle will improve Outcome: Completed/Met   Problem: Coping: Goal: Coping ability will improve Outcome: Completed/Met Goal: Will verbalize feelings Outcome: Completed/Met   Problem: Health Behavior/Discharge Planning: Goal: Ability to make decisions will improve Outcome: Completed/Met Goal: Compliance with therapeutic regimen will improve Outcome: Completed/Met   Problem: Role Relationship: Goal: Will demonstrate positive changes in social behaviors and relationships Outcome: Completed/Met   Problem: Safety: Goal: Ability to disclose and discuss suicidal ideas will improve Outcome: Completed/Met Goal: Ability to identify and utilize support systems that promote safety will improve Outcome: Completed/Met   Problem: Self-Concept: Goal: Will verbalize positive feelings about self Outcome: Completed/Met Goal: Level of anxiety will decrease Outcome: Completed/Met   Problem: Education: Goal: Knowledge of disease or condition will improve Outcome: Completed/Met Goal: Understanding of discharge needs will improve Outcome: Completed/Met   Problem: Health Behavior/Discharge Planning: Goal: Ability to identify changes in lifestyle to reduce recurrence of condition will improve Outcome: Completed/Met Goal: Identification of resources available to assist in meeting health care needs will improve Outcome: Completed/Met   Problem: Physical Regulation: Goal: Complications related to the disease process, condition or treatment will be avoided or minimized Outcome: Completed/Met   Problem: Safety: Goal: Ability to remain free from injury will  improve Outcome: Completed/Met   

## 2018-04-26 NOTE — Progress Notes (Signed)
  [facility] Adult Case Management Discharge Plan :  Will you be returning to the same living situation after discharge:  Yes,  patient is returning to his home, alone At discharge, do you have transportation home?: Yes,  bus pass Do you have the ability to pay for your medications: No.  Release of information consent forms completed and in the chart;  Patient's signature needed at discharge.  Patient to Follow up at: Follow-up Information    [facility]   Why:  Hospital follow-up/medication management at 11:00AM on Monday, 7/1 with Dr. Ninetta Lightselahonte. [name]'s office is 2 doors down. He said to see him when done with appt on Monday to get set up with IOP. Thank you.  Contact information: [address] [facility] [address]  1st Floor [facility] Clinic  [place], [place] Phone: [phone] Fax: [phone]          Next level of care provider has [facility] to [facility] Link:yes  Safety Planning and Suicide Prevention discussed: Yes,  with the patient     Has patient been referred to the Quitline?: N/A patient is not a smoker  Patient has been referred for addiction treatment: Pt. refused referral  Maeola SarahJolan E Rishab Stoudt, LCSWA 04/26/2018, 10:30 AM

## 2018-04-26 NOTE — Progress Notes (Signed)
Recreation Therapy Notes  Date: 6.28.19 Time: 0930 Location: 300 Hall Dayroom  Group Topic: Stress Management  Goal Area(s) Addresses:  Patient will verbalize importance of using healthy stress management.  Patient will identify positive emotions associated with healthy stress management.   Behavioral Response: Engaged  Intervention: Stress Management  Activity :  [employer] Meditation.  LRT introduced the stress management technique of meditation.  LRT read a script on how mountains stay the same in spite of the things that go on around them.  Education:  Stress Management, Discharge Planning.   Education Outcome: Acknowledges edcuation/In group clarification offered/Needs additional education  Clinical Observations/Feedback: Pt attended and participated in group.    Caroll RancherMarjette Elverta Dimiceli, LRT/CTRS         Caroll RancherLindsay, Urijah Arko A 04/26/2018 12:13 PM
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
## 2018-04-26 NOTE — Progress Notes (Signed)
Discharge note:  Patient discharged home per MD order.  Patient received all personal belongings from unit and locker.  Reviewed AVS/transition record with patient and he indicated understanding.  He denies any thoughts of self harm.  [facility]  He has good insight regarding his substance abuse.  Patient received prescriptions of his medications.  [facility]  Patient was given a bus pass and he has his own transportation.

## 2019-02-01 ENCOUNTER — Other Ambulatory Visit: Payer: Self-pay

## 2019-02-01 ENCOUNTER — Inpatient Hospital Stay (HOSPITAL_COMMUNITY)
Admission: RE | Admit: 2019-02-01 | Discharge: 2019-02-04 | DRG: 885 | Disposition: A | Discharge status: Home / Self Care | Payer: Medicare Other | Attending: Psychiatry | Admitting: Psychiatry

## 2019-02-01 ENCOUNTER — Encounter (HOSPITAL_COMMUNITY): Payer: Self-pay

## 2019-02-01 DIAGNOSIS — E739 Lactose intolerance, unspecified: Secondary | ICD-10-CM | POA: Diagnosis present

## 2019-02-01 DIAGNOSIS — Z801 Family history of malignant neoplasm of trachea, bronchus and lung: Secondary | ICD-10-CM

## 2019-02-01 DIAGNOSIS — F4310 Post-traumatic stress disorder, unspecified: Secondary | ICD-10-CM | POA: Diagnosis present

## 2019-02-01 DIAGNOSIS — F1424 Cocaine dependence with cocaine-induced mood disorder: Secondary | ICD-10-CM | POA: Diagnosis not present

## 2019-02-01 DIAGNOSIS — Z8249 Family history of ischemic heart disease and other diseases of the circulatory system: Secondary | ICD-10-CM | POA: Diagnosis not present

## 2019-02-01 DIAGNOSIS — F332 Major depressive disorder, recurrent severe without psychotic features: Secondary | ICD-10-CM | POA: Diagnosis present

## 2019-02-01 DIAGNOSIS — K219 Gastro-esophageal reflux disease without esophagitis: Secondary | ICD-10-CM | POA: Diagnosis present

## 2019-02-01 DIAGNOSIS — F17210 Nicotine dependence, cigarettes, uncomplicated: Secondary | ICD-10-CM | POA: Diagnosis present

## 2019-02-01 DIAGNOSIS — F1420 Cocaine dependence, uncomplicated: Secondary | ICD-10-CM | POA: Diagnosis present

## 2019-02-01 DIAGNOSIS — G43909 Migraine, unspecified, not intractable, without status migrainosus: Secondary | ICD-10-CM | POA: Diagnosis present

## 2019-02-01 DIAGNOSIS — I10 Essential (primary) hypertension: Secondary | ICD-10-CM | POA: Diagnosis present

## 2019-02-01 DIAGNOSIS — R45851 Suicidal ideations: Secondary | ICD-10-CM | POA: Diagnosis present

## 2019-02-01 MED ORDER — MIRTAZAPINE 15 MG PO TABS
15.0000 mg | ORAL_TABLET | Freq: Every day | ORAL | Status: DC
  Filled 2019-02-01 (×3): qty 1

## 2019-02-01 MED ORDER — HYDROXYZINE HCL 25 MG PO TABS
25.0000 mg | ORAL_TABLET | Freq: Three times a day (TID) | ORAL | Status: DC | PRN
  Administered 2019-02-02 – 2019-02-03 (×2): 25 mg via ORAL
  Filled 2019-02-01 (×2): qty 1

## 2019-02-01 MED ORDER — PANTOPRAZOLE SODIUM 40 MG PO TBEC
40.0000 mg | DELAYED_RELEASE_TABLET | Freq: Every day | ORAL | Status: DC
  Administered 2019-02-02 – 2019-02-04 (×3): 40 mg via ORAL
  Filled 2019-02-01 (×5): qty 1

## 2019-02-01 MED ORDER — LISINOPRIL 20 MG PO TABS
20.0000 mg | ORAL_TABLET | Freq: Every day | ORAL | Status: DC
  Administered 2019-02-02 – 2019-02-04 (×3): 20 mg via ORAL
  Filled 2019-02-01 (×5): qty 1

## 2019-02-01 MED ORDER — MAGNESIUM HYDROXIDE 400 MG/5ML PO SUSP: 30.0000 mL | Freq: Every day | ORAL | Status: DC | PRN

## 2019-02-01 MED ORDER — ACETAMINOPHEN 325 MG PO TABS: 650.0000 mg | ORAL_TABLET | Freq: Four times a day (QID) | ORAL | Status: DC | PRN

## 2019-02-01 MED ORDER — ALUM & MAG HYDROXIDE-SIMETH 200-200-20 MG/5ML PO SUSP: 30.0000 mL | ORAL | Status: DC | PRN

## 2019-02-01 MED ORDER — BUPROPION HCL ER (XL) 300 MG PO TB24
300.0000 mg | ORAL_TABLET | Freq: Every day | ORAL | Status: DC
  Administered 2019-02-02: 300 mg via ORAL
  Filled 2019-02-01 (×3): qty 1

## 2019-02-01 NOTE — Tx Team (Signed)
Initial Treatment Plan 02/01/2019 11:54 PM [name] RXV:[id number]    PATIENT STRESSORS: Health problems Medication change or noncompliance Substance abuse   PATIENT STRENGTHS: Average or above average intelligence Capable of independent living General fund of knowledge   PATIENT IDENTIFIED PROBLEMS: "get back on my medication"  "to feel less depressed"                   DISCHARGE CRITERIA:  Improved stabilization in mood, thinking, and/or behavior Need for constant or close observation no longer present Verbal commitment to aftercare and medication compliance  PRELIMINARY DISCHARGE PLAN: Attend 12-step recovery group Return to previous living arrangement  PATIENT/FAMILY INVOLVEMENT: This treatment plan has been presented to and reviewed with the patient, Alexander Munoz, and/or family member.  The patient and family have been given the opportunity to ask questions and make suggestions.  Andrena Mews, RN 02/01/2019, 11:54 PM

## 2019-02-01 NOTE — H&P (Signed)
Behavioral Health Medical Screening Exam  Alexander Munoz is an 58 y.o. male.  Total Time spent with patient: 30 minutes  Psychiatric Specialty Exam: Physical Exam  Constitutional: He is oriented to person, place, and time. He appears well-developed and well-nourished. No distress.  HENT:  Head: Normocephalic and atraumatic.  Right Ear: External ear normal.  Left Ear: External ear normal.  Eyes: Pupils are equal, round, and reactive to light.  Respiratory: Effort normal. No respiratory distress.  Musculoskeletal: Normal range of motion.  Neurological: He is alert and oriented to person, place, and time.  Skin: He is not diaphoretic.  Psychiatric: His mood appears anxious. He is not withdrawn and not actively hallucinating. Thought content is not paranoid and not delusional. Cognition and memory are normal. He exhibits a depressed mood. He expresses suicidal ideation. He expresses no homicidal ideation. He expresses suicidal plans.    Review of Systems  Respiratory: Negative for cough and shortness of breath.   Gastrointestinal: Negative for diarrhea, nausea and vomiting.  Psychiatric/Behavioral: Positive for depression, substance abuse and suicidal ideas. Negative for hallucinations and memory loss. The patient is nervous/anxious and has insomnia.     Blood pressure 124/81, pulse 91, temperature 98.3 F (36.8 C), temperature source Oral, resp. rate 16, height 5\' 8"  (1.727 m), weight 115.2 kg.Body mass index is 38.62 kg/m.  General Appearance: Casual and Well Groomed  Eye Contact:  Fair  Speech:  Clear and Coherent and Normal Rate  Volume:  Normal  Mood:  Anxious, Depressed, Hopeless and Worthless  Affect:  Congruent and Depressed  Thought Process:  Coherent, Goal Directed and Descriptions of Associations: Intact  Orientation:  Full (Time, Place, and Person)  Thought Content:  Logical and Hallucinations: None  Suicidal Thoughts:  Yes.  with intent/plan  Homicidal Thoughts:  No   Memory:  Immediate;   Fair Recent;   Fair  Judgement:  Impaired  Insight:  Fair  Psychomotor Activity:  Normal  Concentration: Concentration: Fair and Attention Span: Fair  Recall:  Fiserv of Knowledge:Fair  Language: Good  Akathisia:  No  Handed:  Right  AIMS (if indicated):     Assets:  Communication Skills Desire for Improvement Financial Resources/Insurance Housing Intimacy Leisure Time Physical Health  Sleep:       Musculoskeletal: Strength & Muscle Tone: within normal limits Gait & Station: normal Patient leans: N/A  Blood pressure 124/81, pulse 91, temperature 98.3 F (36.8 C), temperature source Oral, resp. rate 16, height 5\' 8"  (1.727 m), weight 115.2 kg.  Recommendations:  Based on my evaluation the patient does not appear to have an emergency medical condition.  Jackelyn Poling, NP 02/01/2019, 11:36 PM
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
## 2019-02-02 DIAGNOSIS — F1424 Cocaine dependence with cocaine-induced mood disorder: Secondary | ICD-10-CM

## 2019-02-02 LAB — COMPREHENSIVE METABOLIC PANEL
ALT: 16 U/L (ref 0–44)
AST: 17 U/L (ref 15–41)
Albumin: 3.1 g/dL — ABNORMAL LOW (ref 3.5–5.0)
Alkaline Phosphatase: 48 U/L (ref 38–126)
Anion gap: 6 (ref 5–15)
BUN: 19 mg/dL (ref 6–20)
CO2: 24 mmol/L (ref 22–32)
Calcium: 8.2 mg/dL — ABNORMAL LOW (ref 8.9–10.3)
Chloride: 108 mmol/L (ref 98–111)
Creatinine, Ser: 1.31 mg/dL — ABNORMAL HIGH (ref 0.61–1.24)
GFR calc Af Amer: >60 mL/min (ref >60)
GFR calc non Af Amer: 59 mL/min — ABNORMAL LOW (ref >60)
Glucose, Bld: 98 mg/dL (ref 70–99)
Potassium: 4 mmol/L (ref 3.5–5.1)
Sodium: 138 mmol/L (ref 135–145)
Total Bilirubin: 0.4 mg/dL (ref 0.3–1.2)
Total Protein: 5.4 g/dL — ABNORMAL LOW (ref 6.5–8.1)

## 2019-02-02 LAB — HEMOGLOBIN A1C
Hgb A1c MFr Bld: 5.8 % — ABNORMAL HIGH (ref 4.8–5.6)
Mean Plasma Glucose: 119.76 mg/dL

## 2019-02-02 LAB — LIPID PANEL
Cholesterol: 118 mg/dL (ref 0–200)
HDL: 38 mg/dL — ABNORMAL LOW (ref >40)
Total CHOL/HDL Ratio: 3.1 RATIO
Triglycerides: 66 mg/dL (ref <150)
VLDL: 13 mg/dL (ref 0–40)

## 2019-02-02 LAB — CBC
HCT: 38.9 % — ABNORMAL LOW (ref 39.0–52.0)
Hemoglobin: 12.4 g/dL — ABNORMAL LOW (ref 13.0–17.0)
MCH: 24.4 pg — ABNORMAL LOW (ref 26.0–34.0)
MCHC: 31.9 g/dL (ref 30.0–36.0)
MCV: 76.6 fL — ABNORMAL LOW (ref 80.0–100.0)
Platelets: 446 10*3/uL — ABNORMAL HIGH (ref 150–400)
RBC: 5.08 MIL/uL (ref 4.22–5.81)
RDW: 18.4 % — ABNORMAL HIGH (ref 11.5–15.5)
WBC: 7.6 10*3/uL (ref 4.0–10.5)
nRBC: 0 % (ref 0.0–0.2)

## 2019-02-02 LAB — TSH: TSH: 2.025 u[IU]/mL (ref 0.350–4.500)

## 2019-02-02 MED ORDER — SERTRALINE HCL 50 MG PO TABS
50.0000 mg | ORAL_TABLET | Freq: Every day | ORAL | Status: DC
  Administered 2019-02-03 – 2019-02-04 (×2): 50 mg via ORAL
  Filled 2019-02-02 (×5): qty 1

## 2019-02-02 MED ORDER — HYDROCHLOROTHIAZIDE 12.5 MG PO CAPS
12.5000 mg | ORAL_CAPSULE | Freq: Every day | ORAL | Status: DC
  Administered 2019-02-02 – 2019-02-04 (×3): 12.5 mg via ORAL
  Filled 2019-02-02 (×7): qty 1

## 2019-02-02 MED ORDER — MIRTAZAPINE 7.5 MG PO TABS
7.5000 mg | ORAL_TABLET | Freq: Every day | ORAL | Status: DC
  Administered 2019-02-02 – 2019-02-03 (×2): 7.5 mg via ORAL
  Filled 2019-02-02 (×5): qty 1

## 2019-02-02 NOTE — BHH Suicide Risk Assessment (Signed)
B[facility] INPATIENT:  Family/Significant Other Suicide Prevention Education  Suicide Prevention Education:  Patient Refusal for Family/Significant Other Suicide Prevention Education: The patient Alexander Munoz has refused to provide written consent for family/significant other to be provided Family/Significant Other Suicide Prevention Education during admission and/or prior to discharge.  Physician notified.  SPE completed with pt, as pt refused to consent to family contact. SPI pamphlet provided to pt and pt was encouraged to share information with support network, ask questions, and talk about any concerns relating to SPE. Pt denies access to guns/firearms and verbalized understanding of information provided. Mobile Crisis information also provided to pt.   Rona Ravens LCSW 02/02/2019, 10:03 AM

## 2019-02-02 NOTE — Progress Notes (Signed)
B[facility] Group Notes:  (Nursing/MHT/Case Management/Adjunct)  Date:  02/02/2019  Time:  2030   Type of Therapy:  wrap up group  Participation Level:  Active  Participation Quality:  Appropriate, Attentive, Sharing and Supportive  Affect:  Depressed  Cognitive:  Appropriate  Insight:  Improving  Engagement in Group:  Engaged  Modes of Intervention:  Clarification, Education and Support  Summary of Progress/Problems: If pt could change any one thing about his life he would get rid of his cravings. Pt shares he is grateful for his home.   Marcille Buffy 02/02/2019, 9:49 PM

## 2019-02-02 NOTE — [facility] Group Notes (Signed)
[facility] LCSW Group Therapy Note  Date/Time:  02/02/2019 9:00-10:00 or 10:00-11:00AM  Type of Therapy and Topic:  Group Therapy:  Healthy and Unhealthy Supports  Participation Level:  Active   Description of Group:  Patients in this group were introduced to the idea of adding a variety of healthy supports to address the various needs in their lives.Patients discussed what additional healthy supports could be helpful in their recovery and wellness after discharge in order to prevent future hospitalizations.   An emphasis was placed on using counselor, doctor, therapy groups, 12-step groups, and problem-specific support groups to expand supports.  They also worked as a group on developing a specific plan for several patients to deal with unhealthy supports through boundary-setting, psychoeducation with loved ones, and even termination of relationships.   Therapeutic Goals:   1)  discuss importance of adding supports to stay well once out of the hospital  2)  compare healthy versus unhealthy supports and identify some examples of each  3)  generate ideas and descriptions of healthy supports that can be added  4)  offer mutual support about how to address unhealthy supports  5)  encourage active participation in and adherence to discharge plan    Summary of Patient Progress:  Did not attend   Therapeutic Modalities:   Motivational Interviewing Brief Solution-Focused Therapy  Evorn Gong

## 2019-02-02 NOTE — Progress Notes (Signed)
D: Pt was in the hallway upon initial approach.  Pt presents with anxious, depressed affect and mood.  Forwards little information.  He reports his day was "good" and denies having a goal.  Writer and pt made goal for pt to be safe and sleep well.  Pt denies SI/HI, denies hallucinations, denies pain.  Pt has been visible in milieu interacting with peers and staff appropriately.   A: Introduced self to pt.  Met with pt 1:1.  Actively listened to pt and offered support and encouragement.  Medication administered per order.  PRN medication administered for anxiety.  Q15 minute safety checks maintained.  R: Pt is safe on the unit.  Pt is compliant with medications.  Pt verbally contracts for safety.  Will continue to monitor and assess.

## 2019-02-02 NOTE — Progress Notes (Signed)
Pt is a 58 year old male admitted with depression and suicidal ideation with a plan to overdose on sleeping medication   Pt was recently in a tretment center and discharged in Feb and started using crack two days later    He stopped taking his medications and said he is very depressed and hopeless    He continues to use crack cocaine on a regular basis    Pt was oriented to the unit and given nourishment    Verbal support given  Medications ordered but pt had already fallen asleep    Q 15 min checks initiated    Pt remains safe at this time
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
## 2019-02-02 NOTE — Progress Notes (Signed)
D: Patient alert and oriented. Affect/mood: anxious, pleasant. Patient tends to enjoy retreating to his room during scheduled free time. Denies HI, AVH at this time, though patient endorses passive SI and feelings of depression. Denies pain.   A: Scheduled medications administered to patient per MD order. Support and encouragement provided. Routine safety checks conducted every 15 minutes. Patient informed to notify staff with problems or concerns.  R: No adverse drug reactions noted. Patient contracts for safety at this time. Patient compliant with schedued medications. Patient interacts minimally with others on the unit. Patient remains safe at this time. Will continue to monitor.
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
## 2019-02-03 NOTE — Progress Notes (Signed)
[facility] MD Progress Note  02/03/2019 10:00 AM Alexander Munoz  MRN:  [id number] Subjective:  Patient reports he is feeling "  A little better". Denies medication side effects at this time. Denies suicidal ideations. Denies nightmares or severe flashbacks since admission, describes some hypervigilance, subjective sense of irritability, and intrusive recollections of traumatic events at times . Objective: I have discussed case with treatment team and have met with patient. 58 year old male, presented to hospital voluntarily, reports worsening depression, recent suicidal ideations with thoughts of overdosing, neurovegetative symptoms.  He has a history of PTSD stemming from [place] trauma and had [facility].  He also has a history of cocaine abuse and reports he relapsed in February/using regularly since then.  He was being treated with Wellbutrin XL/Remeron but states he has been off these medications for several weeks after he "ran out".  He states he feels Wellbutrin XL was "no longer working".  Patient reports some improvement compared to admission and is presenting with partially improved mood, affect is reactive, smiles at times during session. Currently denies suicidal ideations. History of PTSD- describes some hypervigilance, subjective irritability, episodic intrusive memories, denies nightmares at this time.  Behavior on unit calm and in good control. Polite on approach.  No disruptive or agitated behaviors on unit, has been visible in milieu.  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 Anxiety   . Depression   . GERD (gastroesophageal reflux disease)   . Hypertension   . Lactose intolerance   . Migraines    History reviewed. No pertinent surgical history. Family History:  Family History  Problem Relation Age of Onset  . Cancer Other   . Hypertension Other   . Heart attack Other    Family Psychiatric  History:  Social History:  Social History   Substance and Sexual Activity  Alcohol Use Not on file   Comment: Alcohol 3x's a week      Social History   Substance and Sexual Activity  Drug Use Not on file   Comment: Daily cocaine use     Social History   Socioeconomic History  . Marital status: Legally Separated    Spouse name: Not on file  . Number of children: Not on file  . Years of education: Not on file  . Highest education level: Not on file  Occupational History  . Not on file  Social Needs  . Financial resource strain: Not on file  . Food insecurity:    Worry: Not on file    Inability: Not on file  . Transportation needs:    Medical: Not on file    Non-medical: Not on file  Tobacco Use  . Smoking status: Current Every Day Smoker    Packs/day: 0.00    Years: 0.00    Pack years: 0.00  . Smokeless tobacco: Never Used  Substance and Sexual Activity  . Alcohol use: Not on file    Comment: Alcohol 3x's a week   . Drug use: Not on file    Comment: Daily cocaine use   . Sexual activity: Not on file  Lifestyle  . Physical  activity:    Days per week: Not on file    Minutes per session: Not on file  . Stress: Not on file  Relationships  . Social connections:    Talks on phone: Not on file    Gets together: Not on file    Attends religious service: Not on file    Active member of club or organization: Not on file    Attends meetings of clubs or organizations: Not on file    Relationship status: Not on file  Other Topics Concern  . Not on file  Social History Narrative  . Not on file   Additional Social History:    Pain  Medications: None Prescriptions: see MAR Over the Counter: see MAR History of alcohol / drug use?: Yes Longest period of sobriety (when/how long): 5 1/2 months Negative Consequences of Use: Financial, Personal relationships Withdrawal Symptoms: Agitation Name of Substance 1: Cocaine (crack) 1 - Age of First Use: 26 1 - Amount (size/oz): 1-2 grams 1 - Frequency: Every other day 1 - Duration: Ongoing. Returned to use 12/21/18 1 - Last Use / Amount: 02/01/19  Sleep: Fair/ improving   Appetite:  improving  Current Medications: Current Facility-Administered Medications  Medication Dose Route Frequency Provider Last Rate Last Dose  . acetaminophen (TYLENOL) tablet 650 mg  650 mg Oral Q6H PRN Lindon Romp A, NP      . alum & mag hydroxide-simeth (MAALOX/MYLANTA) 200-200-20 MG/5ML suspension 30 mL  30 mL Oral Q4H PRN Lindon Romp A, NP      . hydrochlorothiazide (MICROZIDE) capsule 12.5 mg  12.5 mg Oral Daily Cobos, Fernando A, MD   12.5 mg at 02/03/19 0746  . hydrOXYzine (ATARAX/VISTARIL) tablet 25 mg  25 mg Oral TID PRN Rozetta Nunnery, NP   25 mg at 02/02/19 2105  . lisinopril (PRINIVIL,ZESTRIL) tablet 20 mg  20 mg Oral Daily Lindon Romp A, NP   20 mg at 02/03/19 0745  . magnesium hydroxide (MILK OF MAGNESIA) suspension 30 mL  30 mL Oral Daily PRN Lindon Romp A, NP      . mirtazapine (REMERON) tablet 7.5 mg  7.5 mg Oral QHS Cobos, Myer Peer, MD   7.5 mg at 02/02/19 2105  . pantoprazole (PROTONIX) EC tablet 40 mg  40 mg Oral Daily Lindon Romp A, NP   40 mg at 02/03/19 0745  . sertraline (ZOLOFT) tablet 50 mg  50 mg Oral Daily Cobos, Myer Peer, MD   50 mg at 02/03/19 0746    Lab Results:  Results for orders placed or performed during the hospital encounter of 02/01/19 (from the past 48 hour(s))  CBC     Status: Abnormal   Collection Time: 02/02/19  6:22 AM  Result Value Ref Range   WBC 7.6 4.0 - 10.5 K/uL   RBC 5.08 4.22 - 5.81 MIL/uL   Hemoglobin 12.4 (L) 13.0 - 17.0 g/dL   HCT  38.9 (L) 39.0 - 52.0 %   MCV 76.6 (L) 80.0 - 100.0 fL   MCH 24.4 (L) 26.0 - 34.0 pg   MCHC 31.9 30.0 - 36.0 g/dL   RDW 18.4 (H) 11.5 - 15.5 %   Platelets 446 (H) 150 - 400 K/uL   nRBC 0.0 0.0 - 0.2 %    Comment: Performed at [facility], [place] [address] [place], [place] [address]  Comprehensive metabolic panel     Status: Abnormal   Collection Time: 02/02/19  6:22 AM  Result Value Ref Range  Sodium 138 135 - 145 mmol/L   Potassium 4.0 3.5 - 5.1 mmol/L   Chloride 108 98 - 111 mmol/L   CO2 24 22 - 32 mmol/L   Glucose, Bld 98 70 - 99 mg/dL   BUN 19 6 - 20 mg/dL   Creatinine, Ser 1.31 (H) 0.61 - 1.24 mg/dL   Calcium 8.2 (L) 8.9 - 10.3 mg/dL   Total Protein 5.4 (L) 6.5 - 8.1 g/dL   Albumin 3.1 (L) 3.5 - 5.0 g/dL   AST 17 15 - 41 U/L   ALT 16 0 - 44 U/L   Alkaline Phosphatase 48 38 - 126 U/L   Total Bilirubin 0.4 0.3 - 1.2 mg/dL   GFR calc non Af Amer 59 (L) >60 mL/min   GFR calc Af Amer >60 >60 mL/min   Anion gap 6 5 - 15    Comment: Performed at [facility], [place] [address] [place], [place] [address]  Hemoglobin A1c     Status: Abnormal   Collection Time: 02/02/19  6:22 AM  Result Value Ref Range   Hgb A1c MFr Bld 5.8 (H) 4.8 - 5.6 %    Comment: (NOTE) Pre diabetes:          5.7%-6.4% Diabetes:              >6.4% Glycemic control for   <7.0% adults with diabetes    Mean Plasma Glucose 119.76 mg/dL    Comment: Performed at [place] [address], [place], [place] [address]  Lipid panel     Status: Abnormal   Collection Time: 02/02/19  6:22 AM  Result Value Ref Range   Cholesterol 118 0 - 200 mg/dL   Triglycerides 66 <150 mg/dL    Comment: RESULTS CONFIRMED BY MANUAL DILUTION   HDL 38 (L) >40 mg/dL   Total CHOL/HDL Ratio 3.1 RATIO   VLDL 13 0 - 40 mg/dL   LDL Cholesterol NOT CALCULATED 0 - 99 mg/dL    Comment: Performed at [facility], [place] [address] [place], [place] [address]  TSH     Status: None    Collection Time: 02/02/19  6:22 AM  Result Value Ref Range   TSH 2.025 0.350 - 4.500 uIU/mL    Comment: Performed by a 3rd Generation assay with a functional sensitivity of <=0.01 uIU/mL. Performed at [facility], [place] [address], [place], [place] [address]     Blood Alcohol level:  Lab Results  Component Value Date   ETH <10 04/22/2018   ETH <5 49/44/9675    Metabolic Disorder Labs: Lab Results  Component Value Date   HGBA1C 5.8 (H) 02/02/2019   MPG 119.76 02/02/2019   MPG 122.63 04/22/2018   No results found for: PROLACTIN Lab Results  Component Value Date   CHOL 118 02/02/2019   TRIG 66 02/02/2019   HDL 38 (L) 02/02/2019   CHOLHDL 3.1 02/02/2019   VLDL 13 02/02/2019   LDLCALC NOT CALCULATED 02/02/2019   LDLCALC 73 04/22/2018    Physical Findings: AIMS: Facial and Oral Movements Muscles of Facial Expression: None, normal Lips and Perioral Area: None, normal Jaw: None, normal Tongue: None, normal,Extremity Movements Upper (arms, wrists, hands, fingers): None, normal Lower (legs, knees, ankles, toes): None, normal, Trunk Movements Neck, shoulders, hips: None, normal, Overall Severity Severity of abnormal movements (highest score from questions above): None, normal Incapacitation due to abnormal movements: None, normal Patient's awareness of abnormal movements (rate only patient's report): No  Awareness, Dental Status Current problems with teeth and/or dentures?: No Does patient usually wear dentures?: No  CIWA:  CIWA-Ar Total: 3 COWS:     Musculoskeletal: Strength & Muscle Tone: within normal limits Gait & Station: normal Patient leans: N/A  Psychiatric Specialty Exam: Physical Exam  ROS no chest pain, no shortness of breath, no coughing, no fever or chills   Blood pressure (!) 139/102, pulse 71, temperature (!) 97.5 F (36.4 C), temperature source Oral, resp. rate 16, height _0  (1.727 m), weight 115.2 kg.Body mass index is 38.62  kg/m.  General Appearance: improving grooming   Eye Contact:  Good  Speech:  Normal Rate  Volume:  Normal  Mood:  partially improved mood   Affect:  vaguely constricted, but becomes more reactive during session  Thought Process:  Linear and Descriptions of Associations: Intact  Orientation:  Other:  fully alert and attentive  Thought Content:  no hallucinations, no delusions, not internally preoccupied   Suicidal Thoughts:  No denies suicidal or self injurious ideations, denies homicidal ideations  Homicidal Thoughts:  No  Memory:  recent and remote grossly intact   Judgement:  Other:  fair- improving  Insight:  improving   Psychomotor Activity:  Normal no restlessness or agitation  Concentration:  Concentration: Good and Attention Span: Good  Recall:  Good  Fund of Knowledge:  Good  Language:  Good  Akathisia:  Negative  Handed:  Right  AIMS (if indicated):     Assets:  Communication Skills Desire for Improvement Resilience  ADL's:  Intact  Cognition:  WNL  Sleep:  Number of Hours: 5.5   Assessment -  58 year old male, presented to hospital voluntarily, reports worsening depression, recent suicidal ideations with thoughts of overdosing, neurovegetative symptoms.  He has a history of PTSD stemming from [place] trauma and had recently [facility] in [facility].  He also has a history of cocaine abuse and reports he relapsed in February/using regularly since then.  He was being treated with Wellbutrin XL/Remeron but states he has been off these medications for several weeks after he "ran out".  He states he feels Wellbutrin XL was "no longer working".     Treatment Plan Summary: Daily contact with patient to assess and evaluate symptoms and progress in treatment, Medication management, Plan inpatient treatment  and medications as below Encourage group and milieu participation to work on coping skills and symptom reduction Encourage efforts to  work on sobriety and relapse prevention Continue Zoloft 50 mgrs QDAY for depression/PTSD  Continue Remeron 7.5 mgrs QHS for depression/insomnia Continue Lisinopril 20 mgrs QDAY and HCTZ 12.5 mgrs QDAY for HTN Continue Protonix 40 mgrs QDAY for GERD  Treatment team working on disposition planning options Jenne Campus, MD 02/03/2019, 10:00 AM

## 2019-02-03 NOTE — Progress Notes (Signed)
Recreation Therapy Notes  Date:  4.6.20 Time: 0930 Location: [address] Dayroom  Group Topic: Stress Management  Goal Area(s) Addresses:  Patient will identify positive stress management techniques. Patient will identify benefits of using stress management post d/c.  Intervention:  Stress Management  Activity :  Guided Imagery.  LRT introduced the stress management technique of guided imagery.  LRT read a script that guided patients in envisioning their peaceful place.  Patients were to listen and follow along as script was read to engage in activity.    Education:  Stress Management, Discharge Planning.   Education Outcome: Acknowledges Education  Clinical Observations/Feedback:  Pt did not attend group.    Caroll Rancher, LRT/CTRS         Caroll Rancher A 02/03/2019 10:43 AM

## 2019-02-03 NOTE — Tx Team (Signed)
Interdisciplinary Treatment and Diagnostic Plan Update  02/03/2019 Time of Session: 9:00am Alexander Munoz MRN: [id number]  Principal Diagnosis: <principal problem not specified>  Secondary Diagnoses: Active Problems:   Severe recurrent major depression without psychotic features (HCC)   Current Medications:  Current Facility-Administered Medications  Medication Dose Route Frequency Provider Last Rate Last Dose  . acetaminophen (TYLENOL) tablet 650 mg  650 mg Oral Q6H PRN Nira Conn A, NP      . alum & mag hydroxide-simeth (MAALOX/MYLANTA) 200-200-20 MG/5ML suspension 30 mL  30 mL Oral Q4H PRN Nira Conn A, NP      . hydrochlorothiazide (MICROZIDE) capsule 12.5 mg  12.5 mg Oral Daily Cobos, Fernando A, MD   12.5 mg at 02/03/19 0746  . hydrOXYzine (ATARAX/VISTARIL) tablet 25 mg  25 mg Oral TID PRN Jackelyn Poling, NP   25 mg at 02/02/19 2105  . lisinopril (PRINIVIL,ZESTRIL) tablet 20 mg  20 mg Oral Daily Nira Conn A, NP   20 mg at 02/03/19 0745  . magnesium hydroxide (MILK OF MAGNESIA) suspension 30 mL  30 mL Oral Daily PRN Nira Conn A, NP      . mirtazapine (REMERON) tablet 7.5 mg  7.5 mg Oral QHS Cobos, Rockey Situ, MD   7.5 mg at 02/02/19 2105  . pantoprazole (PROTONIX) EC tablet 40 mg  40 mg Oral Daily Nira Conn A, NP   40 mg at 02/03/19 0745  . sertraline (ZOLOFT) tablet 50 mg  50 mg Oral Daily Cobos, Rockey Situ, MD   50 mg at 02/03/19 0746   PTA Medications: Medications Prior to Admission  Medication Sig Dispense Refill Last Dose  . cholecalciferol (VITAMIN D3) 25 MCG (1000 UT) tablet Take 1,000 Units by mouth daily.     . Melatonin 3 MG TABS Take 6 mg by mouth at bedtime as needed (for insomnia).     Marland Kitchen buPROPion (WELLBUTRIN XL) 300 MG 24 hr tablet Take 1 tablet (300 mg total) by mouth daily. For depression 30 tablet 0   . hydrochlorothiazide (MICROZIDE) 12.5 MG capsule Take 1 capsule (12.5 mg total) by mouth daily. For high blood pressure 10 capsule 0   . hydrOXYzine  (ATARAX/VISTARIL) 50 MG tablet Take 1 tablet (50 mg total) by mouth every 6 (six) hours as needed for anxiety. 75 tablet 0   . lisinopril (PRINIVIL,ZESTRIL) 20 MG tablet Take 1 tablet (20 mg total) by mouth daily. For high blood pressure 10 tablet 0   . mirtazapine (REMERON) 15 MG tablet Take 1 tablet (15 mg total) by mouth at bedtime. 30 tablet 0   . pantoprazole (PROTONIX) 40 MG tablet Take 1 tablet (40 mg total) by mouth daily. For acid reflux 10 tablet 0   . ramelteon (ROZEREM) 8 MG tablet Take 1 tablet (8 mg total) by mouth at bedtime. For sleep (Patient not taking: Reported on 02/02/2019) 30 tablet 0 Not Taking at Unknown time    Patient Stressors: Health problems Medication change or noncompliance Substance abuse  Patient Strengths: Average or above average intelligence Capable of independent living General fund of knowledge  Treatment Modalities: Medication Management, Group therapy, Case management,  1 to 1 session with clinician, Psychoeducation, Recreational therapy.   Physician Treatment Plan for Primary Diagnosis: <principal problem not specified> Long Term Goal(s): Improvement in symptoms so as ready for discharge Improvement in symptoms so as ready for discharge   Short Term Goals: Ability to identify changes in lifestyle to reduce recurrence of condition will improve Ability to verbalize feelings  will improve Ability to disclose and discuss suicidal ideas Ability to demonstrate self-control will improve Ability to identify and develop effective coping behaviors will improve Ability to maintain clinical measurements within normal limits will improve Ability to identify changes in lifestyle to reduce recurrence of condition will improve Ability to identify triggers associated with substance abuse/mental health issues will improve  Medication Management: Evaluate patient's response, side effects, and tolerance of medication regimen.  Therapeutic Interventions: 1 to 1  sessions, Unit Group sessions and Medication administration.  Evaluation of Outcomes: Progressing  Physician Treatment Plan for Secondary Diagnosis: Active Problems:   Severe recurrent major depression without psychotic features (HCC)  Long Term Goal(s): Improvement in symptoms so as ready for discharge Improvement in symptoms so as ready for discharge   Short Term Goals: Ability to identify changes in lifestyle to reduce recurrence of condition will improve Ability to verbalize feelings will improve Ability to disclose and discuss suicidal ideas Ability to demonstrate self-control will improve Ability to identify and develop effective coping behaviors will improve Ability to maintain clinical measurements within normal limits will improve Ability to identify changes in lifestyle to reduce recurrence of condition will improve Ability to identify triggers associated with substance abuse/mental health issues will improve     Medication Management: Evaluate patient's response, side effects, and tolerance of medication regimen.  Therapeutic Interventions: 1 to 1 sessions, Unit Group sessions and Medication administration.  Evaluation of Outcomes: Progressing   RN Treatment Plan for Primary Diagnosis: <principal problem not specified> Long Term Goal(s): Knowledge of disease and therapeutic regimen to maintain health will improve  Short Term Goals: Ability to demonstrate self-control, Ability to identify and develop effective coping behaviors will improve and Compliance with prescribed medications will improve  Medication Management: RN will administer medications as ordered by provider, will assess and evaluate patient's response and provide education to patient for prescribed medication. RN will report any adverse and/or side effects to prescribing provider.  Therapeutic Interventions: 1 on 1 counseling sessions, Psychoeducation, Medication administration, Evaluate responses to  treatment, Monitor vital signs and CBGs as ordered, Perform/monitor CIWA, COWS, AIMS and Fall Risk screenings as ordered, Perform wound care treatments as ordered.  Evaluation of Outcomes: Progressing   LCSW Treatment Plan for Primary Diagnosis: <principal problem not specified> Long Term Goal(s): Safe transition to appropriate next level of care at discharge, Engage patient in therapeutic group addressing interpersonal concerns.  Short Term Goals: Engage patient in aftercare planning with referrals and resources, Increase social support, Identify triggers associated with mental health/substance abuse issues and Increase skills for wellness and recovery  Therapeutic Interventions: Assess for all discharge needs, 1 to 1 time with Social worker, Explore available resources and support systems, Assess for adequacy in community support network, Educate family and significant other(s) on suicide prevention, Complete Psychosocial Assessment, Interpersonal group therapy.  Evaluation of Outcomes: Progressing   Progress in Treatment: Attending groups: Yes. Participating in groups: Yes. Taking medication as prescribed: Yes. Toleration medication: Yes. Family/Significant other contact made: No, will contact:  declines consents, completed SPE with patient. Patient understands diagnosis: Yes. Discussing patient identified problems/goals with staff: Yes. Medical problems stabilized or resolved: Yes. Denies suicidal/homicidal ideation: Yes. Issues/concerns per patient self-inventory: No.  New problem(s) identified: No, Describe:  none  New Short Term/Long Term Goal(s): detox, medication management for mood stabilization; elimination of SI thoughts; development of comprehensive mental wellness/sobriety plan.  Patient Goals:  Detox and get back on medications.  Discharge Plan or Barriers: Discharge home, [facility] [place]   Reason for Continuation of Hospitalization:  Anxiety Depression Withdrawal symptoms  Estimated Length of Stay: 1-3 days  Attendees: Patient: Alexander Munoz 02/03/2019 8:46 AM  Physician:  02/03/2019 8:46 AM  Nursing:  02/03/2019 8:46 AM  RN Care Manager: 02/03/2019 8:46 AM  Social Worker: Enid Cutter, LCSWA 02/03/2019 8:46 AM  Recreational Therapist:  02/03/2019 8:46 AM  Other:  02/03/2019 8:46 AM  Other:  02/03/2019 8:46 AM  Other: 02/03/2019 8:46 AM    Scribe for Treatment Team: Darreld Mclean, LCSWA 02/03/2019 8:46 AM

## 2019-02-03 NOTE — Plan of Care (Signed)
Patient self inventory- Patient slept fair last night, sleep medication was requested and was helpful. Appetite is good, energy level low, concentration poor. Depression, hopelessness, and anxiety rated 7, 6, 4 out of 10. Endorses agitation and irritability. Denies SI HI AVH. Denies physical problems and physical pain. Goal is "having hope."  Patient is compliant with medications. No side effects noted. Safety is maintained with 15 minute checks as well as environmental checks.   Problem: Education: Goal: Emotional status will improve Outcome: Progressing Goal: Mental status will improve Outcome: Progressing Goal: Verbalization of understanding the information provided will improve Outcome: Progressing   Problem: Activity: Goal: Interest or engagement in activities will improve Outcome: Progressing

## 2019-02-03 NOTE — Progress Notes (Signed)
The patient expressed in group that he had a good telephone call and that made it a positive day for him. His goal for tomorrow is to get discharged.

## 2019-02-03 NOTE — Progress Notes (Signed)
D   Pt is pleasant on approach and cooperative    He reports feeling better than when he first came in    He reports medication is helping him A   Verbal support given   Medications administered and effectiveness monitored   Q 15 min checks R   Pt is safe at this time

## 2019-02-04 DIAGNOSIS — F332 Major depressive disorder, recurrent severe without psychotic features: Principal | ICD-10-CM

## 2019-02-04 DIAGNOSIS — F1420 Cocaine dependence, uncomplicated: Secondary | ICD-10-CM

## 2019-02-04 MED ORDER — SERTRALINE HCL 50 MG PO TABS: 50.0000 mg | ORAL_TABLET | Freq: Every day | ORAL | 0 refills | Status: DC

## 2019-02-04 MED ORDER — MIRTAZAPINE 7.5 MG PO TABS: 7.5000 mg | ORAL_TABLET | Freq: Every day | ORAL | 0 refills | Status: DC

## 2019-02-04 MED ORDER — HYDROXYZINE HCL 25 MG PO TABS: 25.0000 mg | ORAL_TABLET | Freq: Three times a day (TID) | ORAL | 0 refills | Status: DC | PRN

## 2019-02-04 MED ORDER — PANTOPRAZOLE SODIUM 40 MG PO TBEC: 40.0000 mg | DELAYED_RELEASE_TABLET | Freq: Every day | ORAL | 0 refills | Status: DC

## 2019-02-04 MED ORDER — HYDROCHLOROTHIAZIDE 12.5 MG PO CAPS: 12.5000 mg | ORAL_CAPSULE | Freq: Every day | ORAL | 0 refills | Status: DC

## 2019-02-04 MED ORDER — LISINOPRIL 20 MG PO TABS: 20.0000 mg | ORAL_TABLET | Freq: Every day | ORAL | 0 refills | Status: DC

## 2019-02-04 NOTE — Progress Notes (Signed)
Patient ID: Alexander Munoz, male   DOB: [birth year], 58 y.o.   MRN: [id number]  Nursing Progress Note [id number]  Patient was late to get up this morning but was provided scheduled medications. Patient presents pleasant and cooperative. Patient currently denies SI/HI/AVH. Patient did not complete self-inventory sheet today.  Patient is educated about and provided medication per provider's orders. Patient safety maintained with q15 min safety checks and low fall risk precautions. Emotional support given, 1:1 interaction, and active listening provided. Patient encouraged to attend meals, groups, and work on treatment plan and goals. Labs, vital signs and patient behavior monitored throughout shift.   Patient contracts for safety with staff. Patient remains safe on the unit at this time and agrees to come to staff with any issues/concerns. Will continue to support and monitor.

## 2019-02-04 NOTE — Progress Notes (Signed)
  [facility] Adult Case Management Discharge Plan :  Will you be returning to the same living situation after discharge:  Yes, home At discharge, do you have transportation home?: Yes,  getting a ride Do you have the ability to pay for your medications: Yes,  Medicare  Release of information consent forms completed and in the chart. Patient to Follow up at: Follow-up Information    [facility] Follow up on 02/10/2019.   Why:  Telephonic hospital follow up appointment is Monday, 4/13 at 10:00a.  At this time the appointment will be conducted over the telephone.  Contact information: [address] [facility] [place] [address] Phone: [phone] Fax: [phone]       [facility] up on 04/03/2019.   Why:  Primary care appointment is Thursday, 6/4 at 1:00p.  Please bring your photo ID, proof of insurance, and current medications.  Contact information: [address] [facility] [place] [address] [phone]           Next level of care provider has access to [facility] Link:no  Safety Planning and Suicide Prevention discussed: Yes,  with patient  Have you used any form of tobacco in the last 30 days? (Cigarettes, Smokeless Tobacco, Cigars, and/or Pipes): Yes  Has patient been referred to the Quitline?: Patient refused referral  Patient has been referred for addiction treatment: Yes  Darreld Mclean, LCSWA 02/04/2019, 11:27 AM
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
## 2019-02-04 NOTE — BHH Suicide Risk Assessment (Signed)
[facility] Discharge Suicide Risk Assessment   Principal Problem: <principal problem not specified> Discharge Diagnoses: Active Problems:   Severe recurrent major depression without psychotic features (HCC)   Total Time spent with patient: 15 minutes  Musculoskeletal: Strength & Muscle Tone: within normal limits Gait & Station: normal Patient leans: N/A  Psychiatric Specialty Exam: Review of Systems  All other systems reviewed and are negative.   Blood pressure (!) 128/98, pulse 78, temperature 98.6 F (37 C), temperature source Oral, resp. rate 16, height 5\' 8"  (1.727 m), weight 115.2 kg.Body mass index is 38.62 kg/m.  General Appearance: Casual  Eye Contact::  Good  Speech:  Normal Rate409  Volume:  Normal  Mood:  Euthymic  Affect:  Congruent  Thought Process:  Coherent and Descriptions of Associations: Intact  Orientation:  Full (Time, Place, and Person)  Thought Content:  Logical  Suicidal Thoughts:  No  Homicidal Thoughts:  No  Memory:  Immediate;   Fair Recent;   Fair Remote;   Fair  Judgement:  Intact  Insight:  Fair  Psychomotor Activity:  Normal  Concentration:  Fair  Recall:  [employer] of Knowledge:Good  Language: Good  Akathisia:  Negative  Handed:  Right  AIMS (if indicated):     Assets:  Communication Skills Desire for Improvement Financial Resources/Insurance Housing Physical Health Resilience  Sleep:  Number of Hours: 6.75  Cognition: WNL  ADL's:  Intact   Mental Status Per Nursing Assessment::   On Admission:  Suicidal ideation indicated by patient  Demographic Factors:  Male, Low socioeconomic status, Living alone and Unemployed  Loss Factors: NA  Historical Factors: Impulsivity  Risk Reduction Factors:   Positive therapeutic relationship and Positive coping skills or problem solving skills  Continued Clinical Symptoms:  Depression:   Comorbid alcohol abuse/dependence Impulsivity Alcohol/Substance Abuse/Dependencies  Cognitive  Features That Contribute To Risk:  None    Suicide Risk:  Minimal: No identifiable suicidal ideation.  Patients presenting with no risk factors but with morbid ruminations; may be classified as minimal risk based on the severity of the depressive symptoms  Follow-up Information    [facility] Follow up on 02/10/2019.   Why:  Telephonic hospital follow up appointment is Monday, 4/13 at 10:00a.  At this time the appointment will be conducted over the telephone.  Contact information: [address] [facility] [place] [address] Phone: [phone] Fax: [phone]       [facility] Follow up on 04/03/2019.   Why:  Primary care appointment is Thursday, 6/4 at 1:00p.  Please bring your photo ID, proof of insurance, and current medications.  Contact information: [address] [facility] [place] [address] [phone]           Plan Of Care/Follow-up recommendations:  Activity:  ad lib  Antonieta Pert, MD 02/04/2019, 11:25 AM

## 2019-02-04 NOTE — Plan of Care (Signed)
  Problem: Education: Goal: Knowledge of [place] [facility] Education information/materials will improve Outcome: Progressing   Problem: Activity: Goal: Interest or engagement in activities will improve Outcome: Progressing   Problem: Safety: Goal: Periods of time without injury will increase Outcome: Progressing   

## 2019-02-04 NOTE — Progress Notes (Signed)
Patient ID: Alexander Munoz, male   DOB: Dec 13, 1960, 58 y.o.   MRN: [id number]  Discharge Note  Patient denies SI/HI and states readiness for discharge.  Written and verbal discharge instructions reviewed with the patient. Patient accepting to information and verbalized understanding with no concerns. All belongings returned to patient from the unit and secured lockers. Patient has completed their Suicide Safety Plan and has been provided Suicide Prevention resources. Patient provided an opportunity to complete and return Patient Satisfaction Survey.   Patient was safely escorted to the lobby for discharge. Patient discharged from [facility] with prescriptions, personal belongings, follow-up appointment in place and discharge paperwork.

## 2019-08-11 ENCOUNTER — Telehealth: Payer: Self-pay | Admitting: *Deleted

## 2019-08-11 NOTE — Telephone Encounter (Deleted)
Called and spoke with the patient stating that the pharmacy had sent over a request for the meloxicam and the patient would like another refill of the meloxicam 15 mg and 30 tablets and 2 refills and per Dr Amalia Hailey is ok fill and have 2 refills. Alexander Munoz

## 2020-01-01 ENCOUNTER — Other Ambulatory Visit: Payer: Self-pay

## 2020-01-01 ENCOUNTER — Inpatient Hospital Stay (HOSPITAL_COMMUNITY)
Admission: AD | Admit: 2020-01-01 | Discharge: 2020-01-06 | DRG: 885 | Disposition: A | Discharge status: Home / Self Care | Payer: Medicare Other | Source: Intra-hospital | Attending: Psychiatry | Admitting: Psychiatry

## 2020-01-01 ENCOUNTER — Encounter (HOSPITAL_COMMUNITY): Payer: Self-pay | Admitting: Behavioral Health

## 2020-01-01 DIAGNOSIS — G4700 Insomnia, unspecified: Secondary | ICD-10-CM | POA: Diagnosis present

## 2020-01-01 DIAGNOSIS — F4310 Post-traumatic stress disorder, unspecified: Secondary | ICD-10-CM | POA: Diagnosis present

## 2020-01-01 DIAGNOSIS — F10239 Alcohol dependence with withdrawal, unspecified: Secondary | ICD-10-CM | POA: Diagnosis present

## 2020-01-01 DIAGNOSIS — R45851 Suicidal ideations: Secondary | ICD-10-CM | POA: Diagnosis present

## 2020-01-01 DIAGNOSIS — F332 Major depressive disorder, recurrent severe without psychotic features: Secondary | ICD-10-CM | POA: Diagnosis present

## 2020-01-01 DIAGNOSIS — F1020 Alcohol dependence, uncomplicated: Secondary | ICD-10-CM | POA: Diagnosis not present

## 2020-01-01 DIAGNOSIS — Z79899 Other long term (current) drug therapy: Secondary | ICD-10-CM

## 2020-01-01 DIAGNOSIS — Z915 Personal history of self-harm: Secondary | ICD-10-CM | POA: Diagnosis not present

## 2020-01-01 DIAGNOSIS — I10 Essential (primary) hypertension: Secondary | ICD-10-CM | POA: Diagnosis present

## 2020-01-01 DIAGNOSIS — F17210 Nicotine dependence, cigarettes, uncomplicated: Secondary | ICD-10-CM | POA: Diagnosis present

## 2020-01-01 DIAGNOSIS — F1994 Other psychoactive substance use, unspecified with psychoactive substance-induced mood disorder: Secondary | ICD-10-CM | POA: Insufficient documentation

## 2020-01-01 DIAGNOSIS — F1424 Cocaine dependence with cocaine-induced mood disorder: Secondary | ICD-10-CM | POA: Diagnosis present

## 2020-01-01 DIAGNOSIS — Z20822 Contact with and (suspected) exposure to covid-19: Secondary | ICD-10-CM | POA: Diagnosis present

## 2020-01-01 DIAGNOSIS — K219 Gastro-esophageal reflux disease without esophagitis: Secondary | ICD-10-CM | POA: Diagnosis present

## 2020-01-01 DIAGNOSIS — F1420 Cocaine dependence, uncomplicated: Secondary | ICD-10-CM | POA: Diagnosis not present

## 2020-01-01 LAB — RESPIRATORY PANEL BY RT PCR (FLU A&B, COVID)
Influenza A by PCR: NEGATIVE
Influenza B by PCR: NEGATIVE
SARS Coronavirus 2 by RT PCR: NEGATIVE

## 2020-01-01 MED ORDER — LISINOPRIL 20 MG PO TABS
20.0000 mg | ORAL_TABLET | Freq: Every day | ORAL | Status: DC
  Administered 2020-01-02 – 2020-01-06 (×5): 20 mg via ORAL
  Filled 2020-01-01 (×7): qty 1

## 2020-01-01 MED ORDER — ALUM & MAG HYDROXIDE-SIMETH 200-200-20 MG/5ML PO SUSP
30.0000 mL | ORAL | Status: DC | PRN
  Administered 2020-01-01: 30 mL via ORAL

## 2020-01-01 MED ORDER — MIRTAZAPINE 7.5 MG PO TABS
7.5000 mg | ORAL_TABLET | Freq: Every day | ORAL | Status: DC
  Administered 2020-01-01 – 2020-01-05 (×5): 7.5 mg via ORAL
  Filled 2020-01-01 (×8): qty 1

## 2020-01-01 MED ORDER — HYDROXYZINE HCL 25 MG PO TABS
25.0000 mg | ORAL_TABLET | Freq: Three times a day (TID) | ORAL | Status: DC | PRN
  Administered 2020-01-01 – 2020-01-06 (×5): 25 mg via ORAL
  Filled 2020-01-01 (×5): qty 1

## 2020-01-01 NOTE — Plan of Care (Signed)
BHH Observation Crisis Plan  Reason for Crisis Plan:  Crisis Stabilization and Substance Abuse   Plan of Care:  Referral for Inpatient Hospitalization and Referral for Substance Abuse  Family Support:    "My nephew and my son"  Current Living Environment:  Living Arrangements: Alone  Insurance:   Hospital Account    Name Acct ID Class Status Primary Coverage   Alexander Munoz, Alexander Munoz [id number] BEHAVIORAL HEALTH OBSERVATION Open MEDICARE - MEDICARE PART A        Guarantor Account (for Hospital Account [id number])    Name Relation to Pt Service Area Active? Acct Type   [name] Self [facility] Yes Behavioral Health   Address Phone       [address] [place], [place] [address] [phone])          Coverage Information (for Hospital Account [id number])    F/O Payor/Plan Precert #   MEDICARE/MEDICARE PART A    Subscriber Subscriber #   Arel, T[place]en [id number]   Address Phone   [address] [place], [place] [address]       Legal Guardian:   Self  Primary Care Provider:  [facility]  Current Outpatient Providers:  [facility] and [facility] in [place]  Psychiatrist:  Name of Psychiatrist: [place]  Counselor/Therapist:  Name of Therapist: [facility]  Compliant with Medications:  Yes  Additional Information: "I have been using crack on and off for about 32 years now".   Sherryl Manges 3/4/20216:29 PM

## 2020-01-01 NOTE — Tx Team (Addendum)
Initial Treatment Plan 01/01/2020 11:52 PM [name] AQL:[id number]    PATIENT STRESSORS: Medication change or noncompliance Substance abuse   PATIENT STRENGTHS: General fund of knowledge Motivation for treatment/growth   PATIENT IDENTIFIED PROBLEMS:  risk for suicide  depression  "treatment" " [facility]"                 DISCHARGE CRITERIA:  Improved stabilization in mood, thinking, and/or behavior Verbal commitment to aftercare and medication compliance  PRELIMINARY DISCHARGE PLAN: Attend aftercare/continuing care group Outpatient therapy  PATIENT/FAMILY INVOLVEMENT: This treatment plan has been presented to and reviewed with the patient, Alexander Munoz.  The patient and family have been given the opportunity to ask questions and make suggestions.  Delos Haring, RN 01/01/2020, 11:52 PM

## 2020-01-01 NOTE — H&P (Addendum)
Behavioral Health Medical Screening Exam  Alexander Munoz is an 59 y.o. male.who presents to [facility] as a walk-in, voluntarily. Patient is requesting substance abuse treatment. He has a history of cocaine use disorder and alcohol use disorder and reports due to his addictions, he has been feeling suicidal and depressed. He denies current  plan or intent to harm himself although stated that there are times that he think about,"taking pills like I did in the past." He states his longest sobriety was 2 month and reports releasing December,2020. Reports his last use of both substances was today at 9:00 am. States he drank a beer and reports on average, he drink 3-4 beers every other day and at least 4-5 times per week, he uses 3 grams of crack cocaine. He denies other substance abuse or use. Denies auditory or visual hallucinations or homicidal ideations. He denies access to firearms. Reports his last psychiatric hospitalization was 01/2019 here at [facility] and his last participation in a substance abuse program was last year in [place]. Reports he was receiving out patient psychiatric services through [facility] but states he has not been since last year. Reports he has been off of his medications for several weeks although he has mailed ordered them and they should be arriving at any time. He reports poor sleep because he has been off his medication. Per chart review, his PMH also includes PTSD, depression, anxiety, and one prior SA (2019 by way of overdose as noted in patients history). .   Total Time spent with patient: 20 minutes  Psychiatric Specialty Exam: Physical Exam  Vitals reviewed. Constitutional: He is oriented to person, place, and time.  Neurological: He is alert and oriented to person, place, and time.    Review of Systems  Psychiatric/Behavioral:       Depression     Blood pressure (!) 148/99, pulse 99, temperature 98.2 F (36.8 C), temperature source Oral, resp. rate 18, SpO2 96 %.There is  no height or weight on file to calculate BMI.  General Appearance: Fairly Groomed  Eye Contact:  Good  Speech:  Clear and Coherent and Normal Rate  Volume:  Normal  Mood:  Depressed  Affect:  Appropriate  Thought Process:  Coherent, Linear and Descriptions of Associations: Intact  Orientation:  Full (Time, Place, and Person)  Thought Content:  Logical  Suicidal Thoughts:  Yes.  without intent/plan  Homicidal Thoughts:  No  Memory:  Immediate;   Fair Recent;   Fair  Judgement:  Fair  Insight:  Fair  Psychomotor Activity:  Normal  Concentration: Concentration: Fair and Attention Span: Fair  Recall:  [employer] of Knowledge:Fair  Language: Good  Akathisia:  Negative  Handed:  Right  AIMS (if indicated):     Assets:  Communication Skills Desire for Improvement Resilience Social Support  Sleep:       Musculoskeletal: Strength & Muscle Tone: within normal limits Gait & Station: normal Patient leans: N/A  Blood pressure (!) 148/99, pulse 99, temperature 98.2 F (36.8 C), temperature source Oral, resp. rate 18, SpO2 96 %.  Recommendations:  Based on my evaluation the patient does not appear to have an emergency medical condition.   Patient has a long history of polysubstance abuse to include crack cocaine and alcohol. He PMH is noted as PTSD, depression, anxiety, prior suicide attempts, and psychiatric hospitalizations here at [facility] April, 2020 and June 2019. Although he initially denied SI with plan , he stated to CSW and TTS counselor that he  was unable to contract for safety if he was to leave the hospital today.  Because he is unable to contract for safety, I initially recommended overnight observation. Dicussed case with Dr. Parke Poisson who agreed however, there is no availability on the observation unit so disposition is now inpatient. Patient  reports current medications as. Bupropion 300mg , melatonin 3 mg, Remeron 15 mg, hydroxyzine 50 mg, lisinopril 20mg . These medications have  not been reviewed by pharmacy. Ordered COVID.     Mordecai Maes, NP 01/01/2020, 11:10 AM
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
## 2020-01-01 NOTE — Progress Notes (Signed)
Admission Note:  59 yr male who presents [facility] in no acute distress for the treatment of SI and Depression. Pt appears flat and depressed. Pt was calm and cooperative with admission process. Pt denies SI / AVH at this time.   Per Assessment:  seeking evaluation due to increase depression and suicidal thoughts.  Pt states, "I relapsed Christmas 2020 and been depressed ever since.  The past couple of days I started having suicidal thoughts. I promised my nephew I would come here to be evaluated before I did something to myself."  Pt reports a history of polysubstance use.  Pt states, "I smoke 3 grams of crack cocaine 4-5 x per week and drink beer every other day".  Pt states "I contact [facility] daily trying to get help, but they have a 2 week wait.   A:Skin was assessed in obs . Food and fluids offered, and  accepted. 15 min checks started  R:Pt had no additional questions or concerns. Pt stated he would like to go to [facility]

## 2020-01-02 DIAGNOSIS — F4310 Post-traumatic stress disorder, unspecified: Secondary | ICD-10-CM

## 2020-01-02 DIAGNOSIS — F1020 Alcohol dependence, uncomplicated: Secondary | ICD-10-CM

## 2020-01-02 DIAGNOSIS — F1420 Cocaine dependence, uncomplicated: Secondary | ICD-10-CM

## 2020-01-02 DIAGNOSIS — F332 Major depressive disorder, recurrent severe without psychotic features: Principal | ICD-10-CM

## 2020-01-02 LAB — RAPID URINE DRUG SCREEN, HOSP PERFORMED
Amphetamines: NOT DETECTED
Barbiturates: NOT DETECTED
Benzodiazepines: NOT DETECTED
Cocaine: POSITIVE — AB
Opiates: NOT DETECTED
Tetrahydrocannabinol: NOT DETECTED

## 2020-01-02 LAB — CBC
HCT: 42.7 % (ref 39.0–52.0)
Hemoglobin: 13.7 g/dL (ref 13.0–17.0)
MCH: 26.5 pg (ref 26.0–34.0)
MCHC: 32.1 g/dL (ref 30.0–36.0)
MCV: 82.6 fL (ref 80.0–100.0)
Platelets: 419 10*3/uL — ABNORMAL HIGH (ref 150–400)
RBC: 5.17 MIL/uL (ref 4.22–5.81)
RDW: 14.4 % (ref 11.5–15.5)
WBC: 6.5 10*3/uL (ref 4.0–10.5)
nRBC: 0 % (ref 0.0–0.2)

## 2020-01-02 LAB — LIPID PANEL
Cholesterol: 177 mg/dL (ref 0–200)
HDL: 42 mg/dL (ref >40)
LDL Cholesterol: 103 mg/dL — ABNORMAL HIGH (ref 0–99)
Total CHOL/HDL Ratio: 4.2 RATIO
Triglycerides: 160 mg/dL — ABNORMAL HIGH (ref <150)
VLDL: 32 mg/dL (ref 0–40)

## 2020-01-02 LAB — COMPREHENSIVE METABOLIC PANEL
ALT: 23 U/L (ref 0–44)
AST: 24 U/L (ref 15–41)
Albumin: 3.6 g/dL (ref 3.5–5.0)
Alkaline Phosphatase: 55 U/L (ref 38–126)
Anion gap: 8 (ref 5–15)
BUN: 19 mg/dL (ref 6–20)
CO2: 23 mmol/L (ref 22–32)
Calcium: 8.7 mg/dL — ABNORMAL LOW (ref 8.9–10.3)
Chloride: 109 mmol/L (ref 98–111)
Creatinine, Ser: 1.53 mg/dL — ABNORMAL HIGH (ref 0.61–1.24)
GFR calc Af Amer: 56 mL/min — ABNORMAL LOW (ref >60)
GFR calc non Af Amer: 49 mL/min — ABNORMAL LOW (ref >60)
Glucose, Bld: 126 mg/dL — ABNORMAL HIGH (ref 70–99)
Potassium: 4.2 mmol/L (ref 3.5–5.1)
Sodium: 140 mmol/L (ref 135–145)
Total Bilirubin: 0.1 mg/dL — ABNORMAL LOW (ref 0.3–1.2)
Total Protein: 6.3 g/dL — ABNORMAL LOW (ref 6.5–8.1)

## 2020-01-02 LAB — HEMOGLOBIN A1C
Hgb A1c MFr Bld: 6 % — ABNORMAL HIGH (ref 4.8–5.6)
Mean Plasma Glucose: 125.5 mg/dL

## 2020-01-02 LAB — TSH: TSH: 3.166 u[IU]/mL (ref 0.350–4.500)

## 2020-01-02 LAB — ETHANOL: Alcohol, Ethyl (B): <10 mg/dL (ref <10)

## 2020-01-02 MED ORDER — HYDROCHLOROTHIAZIDE 12.5 MG PO CAPS
12.5000 mg | ORAL_CAPSULE | Freq: Every day | ORAL | Status: DC
  Administered 2020-01-02 – 2020-01-06 (×5): 12.5 mg via ORAL
  Filled 2020-01-02 (×7): qty 1

## 2020-01-02 MED ORDER — ONDANSETRON 4 MG PO TBDP: 4.0000 mg | ORAL_TABLET | Freq: Four times a day (QID) | ORAL | Status: DC | PRN

## 2020-01-02 MED ORDER — ADULT MULTIVITAMIN W/MINERALS CH
1.0000 | ORAL_TABLET | Freq: Every day | ORAL | Status: DC
  Administered 2020-01-02 – 2020-01-06 (×5): 1 via ORAL
  Filled 2020-01-02 (×8): qty 1

## 2020-01-02 MED ORDER — LORAZEPAM 1 MG PO TABS
1.0000 mg | ORAL_TABLET | Freq: Four times a day (QID) | ORAL | Status: AC | PRN
  Administered 2020-01-04: 1 mg via ORAL
  Filled 2020-01-02: qty 1

## 2020-01-02 MED ORDER — THIAMINE HCL 100 MG PO TABS
100.0000 mg | ORAL_TABLET | Freq: Every day | ORAL | Status: DC
  Administered 2020-01-03 – 2020-01-06 (×4): 100 mg via ORAL
  Filled 2020-01-02 (×7): qty 1

## 2020-01-02 MED ORDER — PANTOPRAZOLE SODIUM 40 MG PO TBEC
40.0000 mg | DELAYED_RELEASE_TABLET | Freq: Every day | ORAL | Status: DC
  Administered 2020-01-02 – 2020-01-06 (×5): 40 mg via ORAL
  Filled 2020-01-02 (×8): qty 1

## 2020-01-02 MED ORDER — LOPERAMIDE HCL 2 MG PO CAPS: 2.0000 mg | ORAL_CAPSULE | ORAL | Status: DC | PRN

## 2020-01-02 NOTE — Progress Notes (Signed)
Pt did not attend morning goals group. 
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
## 2020-01-02 NOTE — [facility] Suicide Risk Assessment (Cosign Needed)
[facility] INPATIENT:  Family/Significant Other Suicide Prevention Education  Suicide Prevention Education:  Patient Refusal for Family/Significant Other Suicide Prevention Education: The patient Alexander Munoz has refused to provide written consent for family/significant other to be provided Family/Significant Other Suicide Prevention Education during admission and/or prior to discharge.  Physician notified.  Reynold Bowen 01/02/2020, 9:45 AM
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
## 2020-01-02 NOTE — Progress Notes (Signed)
Recreation Therapy Notes  Date: 3.5.21 Time: 0930 Location: [address] Dayroom  Group Topic: Stress Management  Goal Area(s) Addresses:  Patient will identify positive stress management techniques. Patient will identify benefits of using stress management post d/c.  Intervention: Stress Management  Activity :  Meditation.  LRT played a meditation that focused on making the most of your day and bringing positive intentions into how you maneuver through the day.  Education:  Stress Management, Discharge Planning.   Education Outcome: Acknowledges Education  Clinical Observations/Feedback: Pt did not attend group session.    Caroll Rancher, LRT/CTRS     Lillia Abed, Emika Tiano A 01/02/2020 11:05 AM

## 2020-01-02 NOTE — Tx Team (Signed)
Interdisciplinary Treatment and Diagnostic Plan Update  01/02/2020 Time of Session: 9:00am Alexander Munoz MRN: [id number]  Principal Diagnosis: <principal problem not specified>  Secondary Diagnoses: Active Problems:   Substance induced mood disorder (HCC)   Current Medications:  Current Facility-Administered Medications  Medication Dose Route Frequency Provider Last Rate Last Admin  . alum & mag hydroxide-simeth (MAALOX/MYLANTA) 200-200-20 MG/5ML suspension 30 mL  30 mL Oral Q4H PRN Anike, Adaku C, NP   30 mL at 01/01/20 2218  . hydrOXYzine (ATARAX/VISTARIL) tablet 25 mg  25 mg Oral TID PRN Mordecai Maes, NP   25 mg at 01/01/20 2218  . lisinopril (ZESTRIL) tablet 20 mg  20 mg Oral Daily Mordecai Maes, NP   20 mg at 01/02/20 0829  . mirtazapine (REMERON) tablet 7.5 mg  7.5 mg Oral QHS Mordecai Maes, NP   7.5 mg at 01/01/20 2218   PTA Medications: Medications Prior to Admission  Medication Sig Dispense Refill Last Dose  . hydrochlorothiazide (MICROZIDE) 12.5 MG capsule Take 1 capsule (12.5 mg total) by mouth daily. For high blood pressure 30 capsule 0   . hydrOXYzine (ATARAX/VISTARIL) 25 MG tablet Take 1 tablet (25 mg total) by mouth 3 (three) times daily as needed for anxiety. 30 tablet 0   . lisinopril (PRINIVIL,ZESTRIL) 20 MG tablet Take 1 tablet (20 mg total) by mouth daily. For high blood pressure 30 tablet 0   . mirtazapine (REMERON) 7.5 MG tablet Take 1 tablet (7.5 mg total) by mouth at bedtime. For mood/sleep 30 tablet 0   . pantoprazole (PROTONIX) 40 MG tablet Take 1 tablet (40 mg total) by mouth daily. For heartburn 30 tablet 0   . sertraline (ZOLOFT) 50 MG tablet Take 1 tablet (50 mg total) by mouth daily. For mood 30 tablet 0     Patient Stressors: Medication change or noncompliance Substance abuse  Patient Strengths: Technical sales engineer for treatment/growth  Treatment Modalities: Medication Management, Group therapy, Case management,  1 to 1  session with clinician, Psychoeducation, Recreational therapy.   Physician Treatment Plan for Primary Diagnosis: <principal problem not specified> Long Term Goal(s):     Short Term Goals:    Medication Management: Evaluate patient's response, side effects, and tolerance of medication regimen.  Therapeutic Interventions: 1 to 1 sessions, Unit Group sessions and Medication administration.  Evaluation of Outcomes: Not Met  Physician Treatment Plan for Secondary Diagnosis: Active Problems:   Substance induced mood disorder ([place])  Long Term Goal(s):     Short Term Goals:       Medication Management: Evaluate patient's response, side effects, and tolerance of medication regimen.  Therapeutic Interventions: 1 to 1 sessions, Unit Group sessions and Medication administration.  Evaluation of Outcomes: Not Met   RN Treatment Plan for Primary Diagnosis: <principal problem not specified> Long Term Goal(s): Knowledge of disease and therapeutic regimen to maintain health will improve  Short Term Goals: Ability to participate in decision making will improve, Ability to disclose and discuss suicidal ideas, Ability to identify and develop effective coping behaviors will improve and Compliance with prescribed medications will improve  Medication Management: RN will administer medications as ordered by provider, will assess and evaluate patient's response and provide education to patient for prescribed medication. RN will report any adverse and/or side effects to prescribing provider.  Therapeutic Interventions: 1 on 1 counseling sessions, Psychoeducation, Medication administration, Evaluate responses to treatment, Monitor vital signs and CBGs as ordered, Perform/monitor CIWA, COWS, AIMS and Fall Risk screenings as ordered, Perform wound care  treatments as ordered.  Evaluation of Outcomes: Not Met   LCSW Treatment Plan for Primary Diagnosis: <principal problem not specified> Long Term Goal(s):  Safe transition to appropriate next level of care at discharge, Engage patient in therapeutic group addressing interpersonal concerns.  Short Term Goals: Engage patient in aftercare planning with referrals and resources  Therapeutic Interventions: Assess for all discharge needs, 1 to 1 time with Social worker, Explore available resources and support systems, Assess for adequacy in community support network, Educate family and significant other(s) on suicide prevention, Complete Psychosocial Assessment, Interpersonal group therapy.  Evaluation of Outcomes: Not Met   Progress in Treatment: Attending groups: No.  New to unit Participating in groups: No. Taking medication as prescribed: Yes. Toleration medication: Yes. Family/Significant other contact made: No, will contact:  no one, patient declined consent for collateral contacts Patient understands diagnosis: Yes. Discussing patient identified problems/goals with staff: Yes. Medical problems stabilized or resolved: Yes. Denies suicidal/homicidal ideation: Yes. Issues/concerns per patient self-inventory: No. Other:   New problem(s) identified: None   New Short Term/Long Term Goal(s):Detox, medication stabilization, elimination of SI thoughts, development of comprehensive mental wellness plan.    Patient Goals: "I want to get back on my meds and to get clean. I am on the list for [facility] right now"   Discharge Plan or Barriers: Patient recently admitted. CSW will continue to follow and assess for appropriate referrals and possible discharge planning.    Reason for Continuation of Hospitalization: Aggression Anxiety Depression Medication stabilization Suicidal ideation  Estimated Length of Stay:3-5 days   Attendees: Patient: Alexander Munoz  01/02/2020 8:33 AM  Physician: Dr. Neita Garnet, MD 01/02/2020 8:33 AM  Nursing:  01/02/2020 8:33 AM  RN Care Manager: 01/02/2020 8:33 AM  Social Worker: Radonna Ricker, LCSW 01/02/2020 8:33 AM   Recreational Therapist:  01/02/2020 8:33 AM  Other:  01/02/2020 8:33 AM  Other:  01/02/2020 8:33 AM  Other: 01/02/2020 8:33 AM    Scribe for Treatment Team: Marylee Floras, [place] 01/02/2020 8:33 AM

## 2020-01-02 NOTE — Progress Notes (Signed)
   01/02/20 0815  Psych Admission Type (Psych Patients Only)  Admission Status Voluntary  Psychosocial Assessment  Patient Complaints Anxiety;Depression  Eye Contact Fair  Facial Expression Sad  Affect Appropriate to circumstance  Speech Logical/coherent;Soft  Interaction Assertive  Motor Activity Slow  Appearance/Hygiene In hospital gown  Behavior Characteristics Cooperative  Mood Depressed;Anxious;Sad  Aggressive Behavior  Effect No apparent injury  Thought Process  Coherency WDL  Content Blaming self  Delusions None reported or observed  Perception WDL  Hallucination None reported or observed  Judgment WDL  Confusion None  Danger to Self  Current suicidal ideation? Denies  Danger to Others  Danger to Others None reported or observed

## 2020-01-02 NOTE — Progress Notes (Signed)
   01/02/20 2300  Psych Admission Type (Psych Patients Only)  Admission Status Voluntary  Psychosocial Assessment  Patient Complaints Anxiety  Eye Contact Fair  Facial Expression Sad  Affect Appropriate to circumstance  Speech Logical/coherent;Soft  Interaction Assertive  Motor Activity Slow  Appearance/Hygiene In hospital gown  Behavior Characteristics Cooperative  Mood Depressed;Pleasant  Aggressive Behavior  Effect No apparent injury  Thought Process  Coherency WDL  Content Blaming self  Delusions None reported or observed  Perception WDL  Hallucination None reported or observed  Judgment WDL  Confusion None  Danger to Self  Current suicidal ideation? Denies  Danger to Others  Danger to Others None reported or observed   Pt stated he was doing better, pt said he was getting the SA Tx set up

## 2020-01-02 NOTE — [facility] Group Notes (Signed)
01/02/2020 8:45am Type of Group and Topic: Psychoeducational Group: Discharge Planning  Participation Level: Did Not Attend  Description of Group Discharge planning group reviews patient's anticipated discharge plans and assists patients to anticipate and address any barriers to wellness/recovery in the community. Suicide prevention education is reviewed with patients in group. Therapeutic Goals 1. Patients will state their anticipated discharge plan and mental health aftercare 2. Patients will identify potential barriers to wellness in the community setting 3. Patients will engage in problem solving, solution focused discussion of ways to anticipate and address barriers to wellness/recovery   Summary of Patient Progress Plan for Discharge/Comments:  Invited, chose not to attend.     Baldo Daub, MSW, LCSWA Clinical Social Worker [facility]  Phone: [phone] 01/02/2020 1:52 PM

## 2020-01-03 LAB — BASIC METABOLIC PANEL
Anion gap: 7 (ref 5–15)
BUN: 18 mg/dL (ref 6–20)
CO2: 24 mmol/L (ref 22–32)
Calcium: 8.6 mg/dL — ABNORMAL LOW (ref 8.9–10.3)
Chloride: 107 mmol/L (ref 98–111)
Creatinine, Ser: 1.45 mg/dL — ABNORMAL HIGH (ref 0.61–1.24)
GFR calc Af Amer: >60 mL/min (ref >60)
GFR calc non Af Amer: 52 mL/min — ABNORMAL LOW (ref >60)
Glucose, Bld: 106 mg/dL — ABNORMAL HIGH (ref 70–99)
Potassium: 4 mmol/L (ref 3.5–5.1)
Sodium: 138 mmol/L (ref 135–145)

## 2020-01-03 NOTE — Progress Notes (Addendum)
   01/03/20 0900  Psych Admission Type (Psych Patients Only)  Admission Status Voluntary  Psychosocial Assessment  Patient Complaints None  Eye Contact Fair  Facial Expression Sad  Affect Appropriate to circumstance  Speech Logical/coherent;Soft  Interaction Assertive  Motor Activity Slow  Appearance/Hygiene Unremarkable  Behavior Characteristics Cooperative  Mood Pleasant  Aggressive Behavior  Targets Self  Type of Behavior Verbal  Effect No apparent injury  Thought Process  Coherency WDL  Content WDL  Delusions None reported or observed  Perception WDL  Hallucination None reported or observed  Judgment WDL  Confusion None  Danger to Self  Current suicidal ideation? Denies  Danger to Others  Danger to Others None reported or observed   Pt has been calm and cooperative- pleasant during interactions. Per pt's self inventory, pt rated his depression, hopelessness and anxiety a 6/6/4, respectively. Pt writes that his goal today is "rest". Pt currently denies SI/HI and A/VH

## 2020-01-03 NOTE — Progress Notes (Signed)
   01/03/20 2222  Psych Admission Type (Psych Patients Only)  Admission Status Voluntary  Psychosocial Assessment  Patient Complaints Insomnia  Eye Contact Fair  Facial Expression Flat  Affect Appropriate to circumstance  Speech Logical/coherent  Interaction Assertive  Motor Activity Other (Comment) (WDL)  Appearance/Hygiene Unremarkable  Behavior Characteristics Appropriate to situation  Mood Pleasant;Depressed  Thought Process  Coherency WDL  Content WDL  Delusions None reported or observed  Perception WDL  Hallucination None reported or observed  Judgment WDL  Confusion None  Danger to Self  Current suicidal ideation? Denies  Danger to Others  Danger to Others None reported or observed

## 2020-01-03 NOTE — [facility] Group Notes (Signed)
Adult Psychoeducational Group Note  Date:  01/03/2020 Time:  11:17 AM  Group Topic/Focus:  Goals Group:   The focus of this group is to help patients establish daily goals to achieve during treatment and discuss how the patient can incorporate goal setting into their daily lives to aide in recovery.  Participation Level:  Minimal  Participation Quality:  Appropriate  Affect:  Flat  Cognitive:  Oriented  Insight: Improving  Engagement in Group:  Engaged  Modes of Intervention:  Activity and Support  Additional Comments:  Pt stayed in the group for a little while.  and then got up and left, and didn't come back.  Alexander Munoz A 01/03/2020, 11:17 AM

## 2020-01-03 NOTE — Progress Notes (Signed)
   01/03/20 2220  COVID-19 Daily Checkoff  Have you had a fever (temp > 37.80C/100F)  in the past 24 hours?  No  If you have had runny nose, nasal congestion, sneezing in the past 24 hours, has it worsened? No  COVID-19 EXPOSURE  Have you traveled outside the state in the past 14 days? No  Have you been in contact with someone with a confirmed diagnosis of COVID-19 or PUI in the past 14 days without wearing appropriate PPE? No  Have you been living in the same home as a person with confirmed diagnosis of COVID-19 or a PUI (household contact)? No  Have you been diagnosed with COVID-19? No

## 2020-01-03 NOTE — Progress Notes (Signed)
[facility] MD Progress Note  01/03/2020 10:38 AM Ikenna Ohms  MRN:  [id number] Subjective:  "It's hard to wake up."  Mr. Gustafson found lying in bed. He reports good sleep overnight but still feels fatigued. He had not been sleeping prior to hospitalization and still recovering. He reports improving mood. He feels recent episode of depression has mostly been related to cocaine use. He had gone to [place] in the fall after being hospitalized at the [place], with the plan of attending a long-term rehab in [place]. He was not accepted to the rehab program there and had to return to [facility]. He relapsed when he returned to [facility]. He is hopeful to discharge from this facility to rehab at [facility]. He denies withdrawal symptoms. He denies SI/HI/AVH. Labs reviewed. Creatinine 1.53. Patient denies known history of renal problems.   From admission H&P: He presented to hospital voluntarily reporting worsening depression, suicidal ideations which at this time he describes as passive, endorsing some neurovegetative symptoms such as decreased energy and sleep/anhedonia. He reports that after a period of several months of sobriety he relapsed about 3 months ago.  He identifies cocaine as substance of choice and has been using cocaine regularly/daily.  To a lesser degree he has been drinking regularly as well, about 3-4 beers 2 or 3 times a week.  Principal Problem: MDD (major depressive disorder), recurrent episode, severe ([place]) Diagnosis: Principal Problem:   MDD (major depressive disorder), recurrent episode, severe (HCC) Active Problems:   Cocaine use disorder, severe, dependence (HCC)   PTSD (post-traumatic stress disorder)   Alcohol use disorder, moderate, dependence ([place])  Total Time spent with patient: 15 minutes  Past Psychiatric History: See admission H&P  Past Medical History:  Past Medical History:  Diagnosis Date  . Anxiety   . Depression   . GERD (gastroesophageal reflux disease)   . Hypertension   . Lactose  intolerance   . Migraines    History reviewed. No pertinent surgical history. Family History:  Family History  Problem Relation Age of Onset  . Cancer Other   . Hypertension Other   . Heart attack Other    Family Psychiatric  History: See admission H&P Social History:  Social History   Substance and Sexual Activity  Alcohol Use Yes  . Alcohol/week: 3.0 standard drinks  . Types: 3 Standard drinks or equivalent per week   Comment: Alcohol 3x's a week (beers)     Social History   Substance and Sexual Activity  Drug Use Yes  . Types: "Crack" cocaine, Marijuana   Comment: Daily cocaine use     Social History   Socioeconomic History  . Marital status: Legally Separated    Spouse name: Not on file  . Number of children: Not on file  . Years of education: Not on file  . Highest education level: Not on file  Occupational History  . Not on file  Tobacco Use  . Smoking status: Current Every Day Smoker    Packs/day: 1.00    Years: 0.00    Pack years: 0.00    Types: Cigarettes  . Smokeless tobacco: Never Used  Substance and Sexual Activity  . Alcohol use: Yes    Alcohol/week: 3.0 standard drinks    Types: 3 Standard drinks or equivalent per week    Comment: Alcohol 3x's a week (beers)  . Drug use: Yes    Types: "Crack" cocaine, Marijuana    Comment: Daily cocaine use   . Sexual activity: Not on file  Other  Topics Concern  . Not on file  Social History Narrative  . Not on file   Social Determinants of Health   Financial Resource Strain:   . Difficulty of Paying Living Expenses: Not on file  Food Insecurity:   . Worried About Programme researcher, broadcasting/film/video in the Last Year: Not on file  . Ran Out of Food in the Last Year: Not on file  Transportation Needs:   . Lack of Transportation (Medical): Not on file  . Lack of Transportation (Non-Medical): Not on file  Physical Activity:   . Days of Exercise per Week: Not on file  . Minutes of Exercise per Session: Not on file   Stress:   . Feeling of Stress : Not on file  Social Connections:   . Frequency of Communication with Friends and Family: Not on file  . Frequency of Social Gatherings with Friends and Family: Not on file  . Attends Religious Services: Not on file  . Active Member of Clubs or Organizations: Not on file  . Attends Banker Meetings: Not on file  . Marital Status: Not on file   Additional Social History:    Pain Medications: See MARs Prescriptions: See MARs Over the Counter: See MAR History of alcohol / drug use?: Yes Longest period of sobriety (when/how long): 2 months sober relapsed 10/24/2019 Name of Substance 1: Crack cocaine 1 - Age of First Use: unknown 1 - Amount (size/oz): varies 3 gram 1 - Frequency: 4-5 days per week 1 - Duration: ongoing 1 - Last Use / Amount: few days ago Name of Substance 2: Alcohol 2 - Age of First Use: unknown 2 - Amount (size/oz): varies few beers 2 - Frequency: every other day 2 - Duration: ongoing 2 - Last Use / Amount: unknown                Sleep: Good  Appetite:  Good  Current Medications: Current Facility-Administered Medications  Medication Dose Route Frequency Provider Last Rate Last Admin  . alum & mag hydroxide-simeth (MAALOX/MYLANTA) 200-200-20 MG/5ML suspension 30 mL  30 mL Oral Q4H PRN Anike, Adaku C, NP   30 mL at 01/01/20 2218  . hydrochlorothiazide (MICROZIDE) capsule 12.5 mg  12.5 mg Oral Daily Nira Conn A, NP   12.5 mg at 01/03/20 0841  . hydrOXYzine (ATARAX/VISTARIL) tablet 25 mg  25 mg Oral TID PRN Denzil Magnuson, NP   25 mg at 01/02/20 2147  . lisinopril (ZESTRIL) tablet 20 mg  20 mg Oral Daily Denzil Magnuson, NP   20 mg at 01/03/20 0841  . LORazepam (ATIVAN) tablet 1 mg  1 mg Oral Q6H PRN Nira Conn A, NP      . mirtazapine (REMERON) tablet 7.5 mg  7.5 mg Oral QHS Denzil Magnuson, NP   7.5 mg at 01/02/20 2147  . multivitamin with minerals tablet 1 tablet  1 tablet Oral Daily Nira Conn A,  NP   1 tablet at 01/03/20 0841  . pantoprazole (PROTONIX) EC tablet 40 mg  40 mg Oral Daily Nira Conn A, NP   40 mg at 01/03/20 0841  . thiamine tablet 100 mg  100 mg Oral Daily Nira Conn A, NP   100 mg at 01/03/20 6734    Lab Results:  Results for orders placed or performed during the hospital encounter of 01/01/20 (from the past 48 hour(s))  Respiratory Panel by RT PCR (Flu A&B, Covid) - Nasopharyngeal Swab     Status: None   Collection  Time: 01/01/20 12:44 PM   Specimen: Nasopharyngeal Swab  Result Value Ref Range   SARS Coronavirus 2 by RT PCR NEGATIVE NEGATIVE    Comment: (NOTE) SARS-CoV-2 target nucleic acids are NOT DETECTED. The SARS-CoV-2 RNA is generally detectable in upper respiratoy specimens during the acute phase of infection. The lowest concentration of SARS-CoV-2 viral copies this assay can detect is 131 copies/mL. A negative result does not preclude SARS-Cov-2 infection and should not be used as the sole basis for treatment or other patient management decisions. A negative result may occur with  improper specimen collection/handling, submission of specimen other than nasopharyngeal swab, presence of viral mutation(s) within the areas targeted by this assay, and inadequate number of viral copies (<131 copies/mL). A negative result must be combined with clinical observations, patient history, and epidemiological information. The expected result is Negative. Fact Sheet for Patients:  https://www.moore.com/ Fact Sheet for Healthcare Providers:  https://www.young.biz/ This test is not yet ap proved or cleared by the [place] FDA and  has been authorized for detection and/or diagnosis of SARS-CoV-2 by FDA under an Emergency Use Authorization (EUA). This EUA will remain  in effect (meaning this test can be used) for the duration of the COVID-19 declaration under Section 564(b)(1) of the Act, 21 U.S.C. section  360bbb-3(b)(1), unless the authorization is terminated or revoked sooner.    Influenza A by PCR NEGATIVE NEGATIVE   Influenza B by PCR NEGATIVE NEGATIVE    Comment: (NOTE) The Xpert Xpress SARS-CoV-2/FLU/RSV assay is intended as an aid in  the diagnosis of influenza from Nasopharyngeal swab specimens and  should not be used as a sole basis for treatment. Nasal washings and  aspirates are unacceptable for Xpert Xpress SARS-CoV-2/FLU/RSV  testing. Fact Sheet for Patients: https://www.moore.com/ Fact Sheet for Healthcare Providers: https://www.young.biz/ This test is not yet approved or cleared by the [place] FDA and  has been authorized for detection and/or diagnosis of SARS-CoV-2 by  FDA under an Emergency Use Authorization (EUA). This EUA will remain  in effect (meaning this test can be used) for the duration of the  Covid-19 declaration under Section 564(b)(1) of the Act, 21  U.S.C. section 360bbb-3(b)(1), unless the authorization is  terminated or revoked. Performed at [facility], [address] [place], [place] [address]   CBC     Status: Abnormal   Collection Time: 01/02/20  6:34 AM  Result Value Ref Range   WBC 6.5 4.0 - 10.5 K/uL   RBC 5.17 4.22 - 5.81 MIL/uL   Hemoglobin 13.7 13.0 - 17.0 g/dL   HCT 54.6 50.3 - 54.6 %   MCV 82.6 80.0 - 100.0 fL   MCH 26.5 26.0 - 34.0 pg   MCHC 32.1 30.0 - 36.0 g/dL   RDW 56.8 12.7 - 51.7 %   Platelets 419 (H) 150 - 400 K/uL   nRBC 0.0 0.0 - 0.2 %    Comment: Performed at [facility], [address], [place], [place] [address]  Comprehensive metabolic panel     Status: Abnormal   Collection Time: 01/02/20  6:34 AM  Result Value Ref Range   Sodium 140 135 - 145 mmol/L   Potassium 4.2 3.5 - 5.1 mmol/L   Chloride 109 98 - 111 mmol/L   CO2 23 22 - 32 mmol/L   Glucose, Bld 126 (H) 70 - 99 mg/dL    Comment: Glucose reference range applies only to  samples taken after fasting for at least 8 hours.  BUN 19 6 - 20 mg/dL   Creatinine, Ser 6.60 (H) 0.61 - 1.24 mg/dL   Calcium 8.7 (L) 8.9 - 10.3 mg/dL   Total Protein 6.3 (L) 6.5 - 8.1 g/dL   Albumin 3.6 3.5 - 5.0 g/dL   AST 24 15 - 41 U/L   ALT 23 0 - 44 U/L   Alkaline Phosphatase 55 38 - 126 U/L   Total Bilirubin 0.1 (L) 0.3 - 1.2 mg/dL   GFR calc non Af Amer 49 (L) >60 mL/min   GFR calc Af Amer 56 (L) >60 mL/min   Anion gap 8 5 - 15    Comment: Performed at [facility], [address] [place], [place] [address]  Hemoglobin A1c     Status: Abnormal   Collection Time: 01/02/20  6:34 AM  Result Value Ref Range   Hgb A1c MFr Bld 6.0 (H) 4.8 - 5.6 %    Comment: (NOTE) Pre diabetes:          5.7%-6.4% Diabetes:              >6.4% Glycemic control for   <7.0% adults with diabetes    Mean Plasma Glucose 125.5 mg/dL    Comment: Performed at [facility], [address], [place], [place] [address]  Ethanol     Status: None   Collection Time: 01/02/20  6:34 AM  Result Value Ref Range   Alcohol, Ethyl (B) <10 <10 mg/dL    Comment: (NOTE) Lowest detectable limit for serum alcohol is 10 mg/dL. For medical purposes only. Performed at [facility], [address], [place], [place] [address]   Lipid panel     Status: Abnormal   Collection Time: 01/02/20  6:34 AM  Result Value Ref Range   Cholesterol 177 0 - 200 mg/dL   Triglycerides 322 (H) <150 mg/dL   HDL 42 >02 mg/dL   Total CHOL/HDL Ratio 4.2 RATIO   VLDL 32 0 - 40 mg/dL   LDL Cholesterol 542 (H) 0 - 99 mg/dL    Comment:        Total Cholesterol/HDL:CHD Risk Coronary Heart Disease Risk Table                     Men   Women  1/2 Average Risk   3.4   3.3  Average Risk       5.0   4.4  2 X Average Risk   9.6   7.1  3 X Average Risk  23.4   11.0        Use the calculated Patient Ratio above and the CHD Risk Table to determine the patient's CHD Risk.        ATP III  CLASSIFICATION (LDL):  <100     mg/dL   Optimal  706-237  mg/dL   Near or Above                    Optimal  130-159  mg/dL   Borderline  628-315  mg/dL   High  >176     mg/dL   Very High Performed at [facility], [address] [place] [address] [place], [place] [address]   TSH     Status: None   Collection Time: 01/02/20  6:34 AM  Result Value Ref Range   TSH 3.166 0.350 - 4.500 uIU/mL    Comment: Performed by a 3rd Generation assay with a functional sensitivity of <=0.01 uIU/mL.  Performed at [facility], [address] [place], [place] [address]   Urine rapid drug screen (hosp performed)not at [facility]     Status: Abnormal   Collection Time: 01/02/20  6:15 PM  Result Value Ref Range   Opiates NONE DETECTED NONE DETECTED   Cocaine POSITIVE (A) NONE DETECTED   Benzodiazepines NONE DETECTED NONE DETECTED   Amphetamines NONE DETECTED NONE DETECTED   Tetrahydrocannabinol NONE DETECTED NONE DETECTED   Barbiturates NONE DETECTED NONE DETECTED    Comment: (NOTE) DRUG SCREEN FOR MEDICAL PURPOSES ONLY.  IF CONFIRMATION IS NEEDED FOR ANY PURPOSE, NOTIFY LAB WITHIN 5 DAYS. LOWEST DETECTABLE LIMITS FOR URINE DRUG SCREEN Drug Class                     Cutoff (ng/mL) Amphetamine and metabolites    1000 Barbiturate and metabolites    200 Benzodiazepine                 200 Tricyclics and metabolites     300 Opiates and metabolites        300 Cocaine and metabolites        300 THC                            50 Performed at [facility], [address] [place], [place] [address]   Basic metabolic panel     Status: Abnormal   Collection Time: 01/03/20  6:41 AM  Result Value Ref Range   Sodium 138 135 - 145 mmol/L   Potassium 4.0 3.5 - 5.1 mmol/L   Chloride 107 98 - 111 mmol/L   CO2 24 22 - 32 mmol/L   Glucose, Bld 106 (H) 70 - 99 mg/dL    Comment: Glucose reference range applies only to samples taken after fasting for at least 8 hours.   BUN  18 6 - 20 mg/dL   Creatinine, Ser 0.981.45 (H) 0.61 - 1.24 mg/dL   Calcium 8.6 (L) 8.9 - 10.3 mg/dL   GFR calc non Af Amer 52 (L) >60 mL/min   GFR calc Af Amer >60 >60 mL/min   Anion gap 7 5 - 15    Comment: Performed at [facility], [address] [place], [place] [address]    Blood Alcohol level:  Lab Results  Component Value Date   ETH <10 01/02/2020   ETH <10 04/22/2018    Metabolic Disorder Labs: Lab Results  Component Value Date   HGBA1C 6.0 (H) 01/02/2020   MPG 125.5 01/02/2020   MPG 119.76 02/02/2019   No results found for: PROLACTIN Lab Results  Component Value Date   CHOL 177 01/02/2020   TRIG 160 (H) 01/02/2020   HDL 42 01/02/2020   CHOLHDL 4.2 01/02/2020   VLDL 32 01/02/2020   LDLCALC 103 (H) 01/02/2020   LDLCALC NOT CALCULATED 02/02/2019    Physical Findings: AIMS:  , ,  ,  ,    CIWA:  CIWA-Ar Total: 0 COWS:     Musculoskeletal: Strength & Muscle Tone: within normal limits Gait & Station: normal Patient leans: N/A  Psychiatric Specialty Exam: Physical Exam  Nursing note and vitals reviewed. Constitutional: He is oriented to person, place, and time. He appears well-developed and well-nourished.  Cardiovascular: Normal rate.  Respiratory: Effort normal.  Neurological: He is alert and oriented to person, place, and time.    Review of Systems  Constitutional: Negative.  Respiratory: Negative for cough and shortness of breath.   Psychiatric/Behavioral: Negative for agitation, behavioral problems, dysphoric mood, hallucinations, self-injury, sleep disturbance and suicidal ideas. The patient is not nervous/anxious and is not hyperactive.     Blood pressure (!) 149/98, pulse 79, temperature 97.9 F (36.6 C), temperature source Oral, resp. rate 16, height 5\' 7"  (1.702 m), weight 121.6 kg, SpO2 96 %.Body mass index is 41.97 kg/m.  General Appearance: Casual  Eye Contact:  Good  Speech:  Normal Rate  Volume:  Normal  Mood:  Euthymic   Affect:  Congruent  Thought Process:  Coherent  Orientation:  Full (Time, Place, and Person)  Thought Content:  Logical  Suicidal Thoughts:  No  Homicidal Thoughts:  No  Memory:  Immediate;   Good Recent;   Good  Judgement:  Intact  Insight:  Fair  Psychomotor Activity:  Normal  Concentration:  Concentration: Good and Attention Span: Good  Recall:  Good  Fund of Knowledge:  Good  Language:  Good  Akathisia:  No  Handed:  Right  AIMS (if indicated):     Assets:  Communication Skills Desire for Improvement Financial Resources/Insurance Housing Leisure Time  ADL's:  Intact  Cognition:  WNL  Sleep:  Number of Hours: 6.25     Treatment Plan Summary: Daily contact with patient to assess and evaluate symptoms and progress in treatment and Medication management   Continue inpatient hospitalization.  Continue Remeron 7.5 mg PO QHS for mood/sleep Continue Ativan 1 mg PO Q6HR PRN CIWA>10 for ETOH withdrawal Continue Vistaril 25 mg PO TID PRN anxiety Continue HCTZ 12.5 mg PO daily for HTN Continue lisinopril 20 mg PO daily for HTN Continue Protonix 40 mg PO daily for GERD Continue thiamine 100 mg PO daily for supplementation  Patient will participate in the therapeutic group milieu.  Discharge disposition in progress.   Aldean Baker, NP 01/03/2020, 10:38 AM

## 2020-01-03 NOTE — BHH Group Notes (Signed)
LCSW Group Therapy Note  01/03/2020   10:00-11:00am   Type of Therapy and Topic:  Group Therapy: Anger Cues and Responses  Participation Level:  None   Description of Group:   In this group, patients learned how to recognize the physical, cognitive, emotional, and behavioral responses they have to anger-provoking situations.  They identified a recent time they became angry and how they reacted.  They analyzed how their reaction was possibly beneficial and how it was possibly unhelpful.  The group discussed a variety of healthier coping skills that could help with such a situation in the future.  Focus was placed on how helpful it is to recognize the underlying emotions to our anger, because working on those can lead to a more permanent solution as well as our ability to focus on the important rather than the urgent.  Therapeutic Goals: Patients will remember their last incident of anger and how they felt emotionally and physically, what their thoughts were at the time, and how they behaved. Patients will identify how their behavior at that time worked for them, as well as how it worked against them. Patients will explore possible new behaviors to use in future anger situations. Patients will learn that anger itself is normal and cannot be eliminated, and that healthier reactions can assist with resolving conflict rather than worsening situations.  Summary of Patient Progress:  The patient was only present for the last 10 minutes of group and when given an opportunity to join in and talk, declined.  Therapeutic Modalities:   Cognitive Behavioral Therapy  Lynnell Chad

## 2020-01-04 NOTE — BHH Group Notes (Signed)
Adult Psychoeducational Group Note  Date:  01/04/2020 Time:  12:28 PM  Group Topic/Focus: Adult Psychoeducational Group Note  Date:  01/04/2020 Time:  9:00AM-10:00AM  Group Topic/Focus:  Progressive Relaxation.  A group that uses progressive relaxation with the different parts of the body tensing and relaxing. Also learning deep breathing and visualization  Participation Level:  Active  Participation Quality:  Appropriate  Affect:  Blunted  Cognitive:  Appropriate  Insight: Appropriate  Engagement in Group:  Engaged  Modes of Intervention:  Activity and Education  Additional Comments:  Pt participated fully in the group   Vira Blanco A  01/04/2020   12:28 PM

## 2020-01-04 NOTE — BHH Group Notes (Signed)
Adult Psychoeducational Group Note  Date:  01/04/2020 Time:  3:01 PM  Group Topic/Focus:  Making Healthy Choices:   The focus of this group is to help patients identify negative/unhealthy choices they were using prior to admission and identify positive/healthier coping strategies to replace them upon discharge.  Participation Level:  Minimal  Participation Quality:  Attentive  Affect:  Appropriate  Cognitive:  Oriented  Insight: Improving  Engagement in Group:  Engaged  Modes of Intervention:  Discussion, Role-play and Support  Additional Comments:  Pt is quiet during the group, but is paying attention  Dione Housekeeper 01/04/2020, 3:01 PM

## 2020-01-04 NOTE — Progress Notes (Addendum)
   01/04/20 1000  Psych Admission Type (Psych Patients Only)  Admission Status Voluntary  Psychosocial Assessment  Patient Complaints Sleep disturbance  Eye Contact Fair  Facial Expression Flat  Affect Appropriate to circumstance  Speech Logical/coherent  Interaction Assertive  Motor Activity Other (Comment) (WDL)  Appearance/Hygiene Unremarkable  Behavior Characteristics Cooperative;Appropriate to situation  Mood Depressed;Pleasant  Aggressive Behavior  Targets Self  Type of Behavior Verbal  Effect No apparent injury  Thought Process  Coherency WDL  Content WDL  Delusions None reported or observed  Perception WDL  Hallucination None reported or observed  Judgment WDL  Confusion None  Danger to Self  Current suicidal ideation? Denies  Danger to Others  Danger to Others None reported or observed    Patient has been pleasant during interactions and visible in the milieu interacting well with peers and staff. Per pt's self inventory, pt rated his depression, hopelessness and anxiety a 4/3/4, respectively. Pt writes that his goal today is "staying out of bed". Pt currently denies SI/HI and A/V H.

## 2020-01-04 NOTE — Progress Notes (Deleted)
   01/04/20 2204  Psych Admission Type (Psych Patients Only)  Admission Status Voluntary  Psychosocial Assessment  Patient Complaints Insomnia  Eye Contact Fair  Facial Expression Flat  Affect Appropriate to circumstance  Speech Logical/coherent  Interaction Assertive  Motor Activity Other (Comment) (WDL)  Appearance/Hygiene Unremarkable  Behavior Characteristics Appropriate to situation  Mood Pleasant  Thought Process  Coherency WDL  Content WDL  Delusions None reported or observed  Perception WDL  Hallucination None reported or observed  Judgment WDL  Confusion None  Danger to Self  Current suicidal ideation? Denies  Danger to Others  Danger to Others None reported or observed   

## 2020-01-04 NOTE — BHH Group Notes (Signed)
Adult Psychoeducational Group Note  Date:  01/04/2020 Time:  3:05 PM  Group Topic/Focus: PROGRESSIVE RELAXATION.Marland Kitchen A group where deep breathing is taught and tensing and relaxation muscle groups is used. Imagery is used with both deep breathing and tensing and relaxing of the muscle groups.    Participation Level:  Minimal  Participation Quality:  Drowsy  Affect:  Flat  Cognitive:  Oriented  Insight: Improving  Engagement in Group:  Lacking  Modes of Intervention:  Activity and Support  Additional Comments:  Pt listened but did not follow the prompting of this writer during the session.  Vira Blanco A 01/04/2020, 3:05 PM

## 2020-01-04 NOTE — Progress Notes (Signed)
   01/04/20 2158  COVID-19 Daily Checkoff  Have you had a fever (temp > 37.80C/100F)  in the past 24 hours?  No  If you have had runny nose, nasal congestion, sneezing in the past 24 hours, has it worsened? No  COVID-19 EXPOSURE  Have you traveled outside the state in the past 14 days? No  Have you been in contact with someone with a confirmed diagnosis of COVID-19 or PUI in the past 14 days without wearing appropriate PPE? No  Have you been living in the same home as a person with confirmed diagnosis of COVID-19 or a PUI (household contact)? No  Have you been diagnosed with COVID-19? No

## 2020-01-04 NOTE — Progress Notes (Signed)
[place] NOVEL CORONAVIRUS (COVID-19) DAILY CHECK-OFF SYMPTOMS - answer yes or no to each - every day NO YES  Have you had a fever in the past 24 hours?  . Fever (Temp > 37.80C / 100F) X   Have you had any of these symptoms in the past 24 hours? . New Cough .  Sore Throat  .  Shortness of Breath .  Difficulty Breathing .  Unexplained Body Aches   X   Have you had any one of these symptoms in the past 24 hours not related to allergies?   . Runny Nose .  Nasal Congestion .  Sneezing   X   If you have had runny nose, nasal congestion, sneezing in the past 24 hours, has it worsened?  X   EXPOSURES - check yes or no X   Have you traveled outside the state in the past 14 days?  X   Have you been in contact with someone with a confirmed diagnosis of COVID-19 or PUI in the past 14 days without wearing appropriate PPE?  X   Have you been living in the same home as a person with confirmed diagnosis of COVID-19 or a PUI (household contact)?    X   Have you been diagnosed with COVID-19?    X              What to do next: Answered NO to all: Answered YES to anything:   Proceed with unit schedule Follow the [facility] Inpatient Flowsheet.   

## 2020-01-04 NOTE — [facility] Group Notes (Signed)
Adult Psychoeducational Group Note  Date:  01/04/2020 Time:  9:31 PM  Group Topic/Focus:  Wrap-Up Group:   The focus of this group is to help patients review their daily goal of treatment and discuss progress on daily workbooks.  Participation Level:  Active  Participation Quality:  Appropriate and Attentive  Affect:  Appropriate  Cognitive:  Alert and Appropriate  Insight: Appropriate and Good  Engagement in Group:  Engaged  Modes of Intervention:  Discussion and Education  Additional Comments:  Pt attended and participated in wrap up group this evening and rated their day a 7/10. Pt spoke with Dr and work on a [employer]. Pt plans to go to [facility] and if they do not get a bed they plan to go home Wednesday. Pt has completed their goal, which is to call [facility] daily for a bed.   Chrisandra Netters 01/04/2020, 9:31 PM

## 2020-01-04 NOTE — [facility] Group Notes (Signed)
Adult Psychoeducational Group Note  Date:  01/04/2020 Time:  3:02 PM  Group Topic/Focus: PROGRESSIVE RELAXATION.Marland Kitchen A group where deep breathing is taught and tensing and relaxation muscle groups is used. Imagery is used with both deep breathing and tensing and relaxing of the muscle groups.    Participation Level:  Active  Participation Quality:  Appropriate  Affect:  Flat  Cognitive:  Oriented  Insight: Lacking  Engagement in Group:  Limited  Modes of Intervention:  Activity and Support  Additional Comments:  Pt remains quiet during the group and did not follow the prompting of this writer to tense and relax.  Dione Housekeeper 01/04/2020, 3:02 PM
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
## 2020-01-04 NOTE — [facility] Group Notes (Signed)
[facility] LCSW Group Therapy Note  01/04/2020    Type of Therapy and Topic:  Group Therapy:  Adding Supports Including Yourself  Participation Level:  None   Description of Group:   Patients in this group were introduced to the concept that additional supports including self-support are an essential part of recovery.  Patients listed what supports they believe they need to add to their lives to achieve their goals at discharge, and they listed such things as therapist, family, doctor, support groups, 12-step groups and service animals.   A song entitled "My Own Hero" was played and a group discussion ensued in which patients stated they could relate to the song and it inspired them to realize they have be willing to help themselves in order to succeed, because other people cannot achieve sobriety or stability for them.  "Fight For It" was played, then "I Am Enough" to encourage patients.  They discussed the impact on them and how they must remain convinced that their lives are worth the effort it takes to become sober and/or stable.  Therapeutic Goals: 1)  demonstrate the importance of being a key part of one's own support system 2)  discuss various available supports 3)  encourage patient to use music as part of their self-support and focus on goals 4)  elicit ideas from patients about supports that need to be added   Summary of Patient Progress:  The patient expressed little during group, even when called on directly.   Therapeutic Modalities:   Motivational Interviewing Activity  Carloyn Jaeger Grossman-Orr  8:27 AM

## 2020-01-04 NOTE — Progress Notes (Signed)
   01/04/20 2204  Psych Admission Type (Psych Patients Only)  Admission Status Voluntary  Psychosocial Assessment  Patient Complaints Insomnia  Eye Contact Fair  Facial Expression Flat  Affect Appropriate to circumstance  Speech Logical/coherent  Interaction Assertive  Motor Activity Other (Comment) (WDL)  Appearance/Hygiene Unremarkable  Behavior Characteristics Appropriate to situation  Mood Pleasant  Thought Process  Coherency WDL  Content WDL  Delusions None reported or observed  Perception WDL  Hallucination None reported or observed  Judgment WDL  Confusion None  Danger to Self  Current suicidal ideation? Denies  Danger to Others  Danger to Others None reported or observed

## 2020-01-05 LAB — BASIC METABOLIC PANEL
Anion gap: 7 (ref 5–15)
BUN: 17 mg/dL (ref 6–20)
CO2: 25 mmol/L (ref 22–32)
Calcium: 8.7 mg/dL — ABNORMAL LOW (ref 8.9–10.3)
Chloride: 108 mmol/L (ref 98–111)
Creatinine, Ser: 1.47 mg/dL — ABNORMAL HIGH (ref 0.61–1.24)
GFR calc Af Amer: 59 mL/min — ABNORMAL LOW (ref >60)
GFR calc non Af Amer: 51 mL/min — ABNORMAL LOW (ref >60)
Glucose, Bld: 105 mg/dL — ABNORMAL HIGH (ref 70–99)
Potassium: 4.2 mmol/L (ref 3.5–5.1)
Sodium: 140 mmol/L (ref 135–145)

## 2020-01-05 NOTE — Plan of Care (Signed)
Nurse discussed anxiety, depression and coping skills with patient.  

## 2020-01-05 NOTE — Progress Notes (Addendum)
CSW met with the patient to discuss discharge planning. CSW ensured the patient that he remains on the wait list for [facility] in [place], [place]. Patient reports [facility] is the only referral he would like at this time; [facility] and [facility] referrals.   CSW expressed that it is likely the patient will be medically cleared for discharge on Wednesday due to his acute needs being met, regardless if a bed was available or not. Patient expressed understanding and states that he was already under the impression that he would not be able to remain in the hospital until a bed became available. He reports that he has his own place and can return there at discharge.   CSW will continue to follow up with [facility] to determine when a bed will become available. As of 01/05/2020 their facility remains at full capacity. Patient has "checked in" on a daily basis since being placed on the wait list. CSW will continue to follow.    Radonna Ricker, MSW, LCSW Clinical Social Worker [facility]  Phone: [phone]

## 2020-01-05 NOTE — Progress Notes (Signed)
D:  Patient denied SI and HI, contracts for safety.  Denied A/V hallucinations.  Denied pain. A:  Medications administered per MD orders.  Emotional support and encouragement given patient. R:  Safety maintained with 15 minute checks.  

## 2020-01-05 NOTE — Progress Notes (Addendum)
ADULT GRIEF GROUP NOTE:  Spiritual care group on grief and loss facilitated by chaplain Burnis Kingfisher  Group Goal:  Support / Education around grief and loss  Members engage in facilitated group support and psycho-social education.  Group Description:  Following introductions and group rules, group members engaged in facilitated group dialog and support around topic of loss, with particular support around experiences of loss in their lives. Group Identified types of loss (relationships / self / things) and identified patterns, circumstances, and changes that precipitate losses. Reflected on thoughts / feelings around loss, normalized grief responses, and recognized variety in grief experience.  Patient Progress: Alexander Munoz was present through majority of group.  Was alert and observant of conversation.  Did not engage in conversation.  He left group room 75% of way through group and did not return.

## 2020-01-05 NOTE — Progress Notes (Signed)
Recreation Therapy Notes  Date:  3.8.21 Time: 0930 Location: 300 Hall Dayroom  Group Topic: Stress Management  Goal Area(s) Addresses:  Patient will identify positive stress management techniques. Patient will identify benefits of using stress management post d/c.  Behavioral Response:  Engaged  Intervention: Stress Management  Activity :  Meditation.  LRT played a meditation on being resilient in the face of adversity.  Patients were to focus and listen to the meditation as it played to fully engage in activity.    Education:  Stress Management, Discharge Planning.   Education Outcome: Acknowledges Education  Clinical Observations/Feedback:  Pt attended and participated in activity.     Caroll Rancher, LRT/CTRS    Caroll Rancher A 01/05/2020 11:39 AM

## 2020-01-05 NOTE — Progress Notes (Signed)
   01/05/20 2200  Psych Admission Type (Psych Patients Only)  Admission Status Voluntary  Psychosocial Assessment  Patient Complaints Suspiciousness  Eye Contact Fair  Facial Expression Flat  Affect Appropriate to circumstance  Speech Logical/coherent  Interaction Assertive  Motor Activity Other (Comment) (WDL)  Appearance/Hygiene Unremarkable  Behavior Characteristics Anxious  Mood Depressed  Thought Process  Coherency WDL  Content WDL  Delusions None reported or observed  Perception WDL  Hallucination None reported or observed  Judgment WDL  Confusion None  Danger to Self  Current suicidal ideation? Denies  Danger to Others  Danger to Others None reported or observed

## 2020-01-05 NOTE — Progress Notes (Signed)
The patient stated that what he learned today is that he needs to "practice patience". He is currently working on finding long-term treatment for himself. His goal for tomorrow is to not obsess as much.

## 2020-01-05 NOTE — [facility] Group Notes (Signed)
LCSW Group Therapy Note 01/05/2020 3:08 PM  Type of Therapy and Topic: Group Therapy: Overcoming Obstacles  Participation Level: Active  Description of Group:  In this group patients will be encouraged to explore what they see as obstacles to their own wellness and recovery. They will be guided to discuss their thoughts, feelings, and behaviors related to these obstacles. The group will process together ways to cope with barriers, with attention given to specific choices patients can make. Each patient will be challenged to identify changes they are motivated to make in order to overcome their obstacles. This group will be process-oriented, with patients participating in exploration of their own experiences as well as giving and receiving support and challenge from other group members.  Therapeutic Goals: 1. Patient will identify personal and current obstacles as they relate to admission. 2. Patient will identify barriers that currently interfere with their wellness or overcoming obstacles.  3. Patient will identify feelings, thought process and behaviors related to these barriers. 4. Patient will identify two changes they are willing to make to overcome these obstacles:   Summary of Patient Progress  Dhruva was engaged and participated throughout the group session. Yon states that his main obstacle is his isolation when he is home. He states that his main barrier is his lack of accountability. He shared that in the past, he would associate himself with people who continued to use drugs, eventually leading him to indulge himself. Nickey reports that he plans to go to rehab at [facility] once a bed becomes available to continue to treatment of his substance abuse.   He also shared that he plans to "cut off" his former associates who did not support his road to recovery.    Therapeutic Modalities:  Cognitive Behavioral Therapy Solution Focused Therapy Motivational Interviewing Relapse Prevention  Therapy   Alcario Drought Clinical Social Worker

## 2020-01-05 NOTE — Progress Notes (Signed)
[facility] MD Progress Note  01/05/2020 10:50 AM Alexander Munoz  MRN:  [id number] Subjective:  "I'm doing well."  Alexander Munoz found sitting in the dayroom. He presents with fuller range of affect and smiles at times appropriately. He reports mood is significantly improved from admission due to "clearing the drugs and starting meds." He has been visible and active in unit milieu, participating in groups. He reports good sleep overnight and good appetite. He is tolerating medications well. He denies cravings or withdrawal symptoms at this time. He is hopeful to discharge straight to rehab, due to concerns for relapse, but understands this may not be possible related to pending bed availability. He is able to return home to his apartment. He is planning to return to AA and also reports having a nephew nearby who is supportive. He denies SI/HI/AVH.   From admission H&P: He presented to hospital voluntarily reporting worsening depression, suicidal ideations which at this time he describes as passive,endorsing some neurovegetative symptoms such as decreased energy and sleep/anhedonia. He reports that after a period of several months of sobriety he relapsed about 3 months ago. He identifies cocaine as substance of choice and has been using cocaine regularly/daily. To a lesser degree he has been drinking regularly as well, about 3-4 beers 2 or 3 times a week.  Principal Problem: MDD (major depressive disorder), recurrent episode, severe (HCC) Diagnosis: Principal Problem:   MDD (major depressive disorder), recurrent episode, severe (HCC) Active Problems:   Cocaine use disorder, severe, dependence (HCC)   PTSD (post-traumatic stress disorder)   Alcohol use disorder, moderate, dependence (HCC)  Total Time spent with patient: 15 minutes  Past Psychiatric History: See admission H&P  Past Medical History:  Past Medical History:  Diagnosis Date  . Anxiety   . Depression   . GERD (gastroesophageal reflux  disease)   . Hypertension   . Lactose intolerance   . Migraines    History reviewed. No pertinent surgical history. Family History:  Family History  Problem Relation Age of Onset  . Cancer Other   . Hypertension Other   . Heart attack Other    Family Psychiatric  History: See admission H&P Social History:  Social History   Substance and Sexual Activity  Alcohol Use Yes  . Alcohol/week: 3.0 standard drinks  . Types: 3 Standard drinks or equivalent per week   Comment: Alcohol 3x's a week (beers)     Social History   Substance and Sexual Activity  Drug Use Yes  . Types: "Crack" cocaine, Marijuana   Comment: Daily cocaine use     Social History   Socioeconomic History  . Marital status: Legally Separated    Spouse name: Not on file  . Number of children: Not on file  . Years of education: Not on file  . Highest education level: Not on file  Occupational History  . Not on file  Tobacco Use  . Smoking status: Current Every Day Smoker    Packs/day: 1.00    Years: 0.00    Pack years: 0.00    Types: Cigarettes  . Smokeless tobacco: Never Used  Substance and Sexual Activity  . Alcohol use: Yes    Alcohol/week: 3.0 standard drinks    Types: 3 Standard drinks or equivalent per week    Comment: Alcohol 3x's a week (beers)  . Drug use: Yes    Types: "Crack" cocaine, Marijuana    Comment: Daily cocaine use   . Sexual activity: Not on file  Other Topics  Concern  . Not on file  Social History Narrative  . Not on file   Social Determinants of Health   Financial Resource Strain:   . Difficulty of Paying Living Expenses: Not on file  Food Insecurity:   . Worried About Charity fundraiser in the Last Year: Not on file  . Ran Out of Food in the Last Year: Not on file  Transportation Needs:   . Lack of Transportation (Medical): Not on file  . Lack of Transportation (Non-Medical): Not on file  Physical Activity:   . Days of Exercise per Week: Not on file  . Minutes of  Exercise per Session: Not on file  Stress:   . Feeling of Stress : Not on file  Social Connections:   . Frequency of Communication with Friends and Family: Not on file  . Frequency of Social Gatherings with Friends and Family: Not on file  . Attends Religious Services: Not on file  . Active Member of Clubs or Organizations: Not on file  . Attends Archivist Meetings: Not on file  . Marital Status: Not on file   Additional Social History:    Pain Medications: See MARs Prescriptions: See MARs Over the Counter: See MAR History of alcohol / drug use?: Yes Longest period of sobriety (when/how long): 2 months sober relapsed 10/24/2019 Name of Substance 1: Crack cocaine 1 - Age of First Use: unknown 1 - Amount (size/oz): varies 3 gram 1 - Frequency: 4-5 days per week 1 - Duration: ongoing 1 - Last Use / Amount: few days ago Name of Substance 2: Alcohol 2 - Age of First Use: unknown 2 - Amount (size/oz): varies few beers 2 - Frequency: every other day 2 - Duration: ongoing 2 - Last Use / Amount: unknown                Sleep: Good  Appetite:  Good  Current Medications: Current Facility-Administered Medications  Medication Dose Route Frequency Provider Last Rate Last Admin  . alum & mag hydroxide-simeth (MAALOX/MYLANTA) 200-200-20 MG/5ML suspension 30 mL  30 mL Oral Q4H PRN Anike, Adaku C, NP   30 mL at 01/01/20 2218  . hydrochlorothiazide (MICROZIDE) capsule 12.5 mg  12.5 mg Oral Daily Lindon Romp A, NP   12.5 mg at 01/05/20 0811  . hydrOXYzine (ATARAX/VISTARIL) tablet 25 mg  25 mg Oral TID PRN Mordecai Maes, NP   25 mg at 01/03/20 2137  . lisinopril (ZESTRIL) tablet 20 mg  20 mg Oral Daily Mordecai Maes, NP   20 mg at 01/05/20 0810  . mirtazapine (REMERON) tablet 7.5 mg  7.5 mg Oral QHS Mordecai Maes, NP   7.5 mg at 01/04/20 2109  . multivitamin with minerals tablet 1 tablet  1 tablet Oral Daily Lindon Romp A, NP   1 tablet at 01/05/20 0811  .  pantoprazole (PROTONIX) EC tablet 40 mg  40 mg Oral Daily Lindon Romp A, NP   40 mg at 01/05/20 0811  . thiamine tablet 100 mg  100 mg Oral Daily Lindon Romp A, NP   100 mg at 01/05/20 7681    Lab Results:  Results for orders placed or performed during the hospital encounter of 01/01/20 (from the past 48 hour(s))  Basic metabolic panel     Status: Abnormal   Collection Time: 01/05/20  6:35 AM  Result Value Ref Range   Sodium 140 135 - 145 mmol/L   Potassium 4.2 3.5 - 5.1 mmol/L   Chloride 108  98 - 111 mmol/L   CO2 25 22 - 32 mmol/L   Glucose, Bld 105 (H) 70 - 99 mg/dL    Comment: Glucose reference range applies only to samples taken after fasting for at least 8 hours.   BUN 17 6 - 20 mg/dL   Creatinine, Ser 3.57 (H) 0.61 - 1.24 mg/dL   Calcium 8.7 (L) 8.9 - 10.3 mg/dL   GFR calc non Af Amer 51 (L) >60 mL/min   GFR calc Af Amer 59 (L) >60 mL/min   Anion gap 7 5 - 15    Comment: Performed at [facility], [address] [place], [place] [address]    Blood Alcohol level:  Lab Results  Component Value Date   ETH <10 01/02/2020   ETH <10 04/22/2018    Metabolic Disorder Labs: Lab Results  Component Value Date   HGBA1C 6.0 (H) 01/02/2020   MPG 125.5 01/02/2020   MPG 119.76 02/02/2019   No results found for: PROLACTIN Lab Results  Component Value Date   CHOL 177 01/02/2020   TRIG 160 (H) 01/02/2020   HDL 42 01/02/2020   CHOLHDL 4.2 01/02/2020   VLDL 32 01/02/2020   LDLCALC 103 (H) 01/02/2020   LDLCALC NOT CALCULATED 02/02/2019    Physical Findings: AIMS: Facial and Oral Movements Muscles of Facial Expression: None, normal Lips and Perioral Area: None, normal Jaw: None, normal Tongue: None, normal,Extremity Movements Upper (arms, wrists, hands, fingers): None, normal Lower (legs, knees, ankles, toes): None, normal, Trunk Movements Neck, shoulders, hips: None, normal, Overall Severity Severity of abnormal movements (highest score from questions  above): None, normal Incapacitation due to abnormal movements: None, normal Patient's awareness of abnormal movements (rate only patient's report): No Awareness, Dental Status Current problems with teeth and/or dentures?: No Does patient usually wear dentures?: No  CIWA:  CIWA-Ar Total: 0 COWS:     Musculoskeletal: Strength & Muscle Tone: within normal limits Gait & Station: normal Patient leans: N/A  Psychiatric Specialty Exam: Physical Exam  Nursing note and vitals reviewed. Constitutional: He is oriented to person, place, and time. He appears well-developed and well-nourished.  Cardiovascular: Normal rate.  Respiratory: Effort normal.  Neurological: He is alert and oriented to person, place, and time.    Review of Systems  Constitutional: Negative.   Respiratory: Negative for cough and shortness of breath.   Gastrointestinal: Negative for nausea and vomiting.  Neurological: Negative for tremors and headaches.  Psychiatric/Behavioral: Negative for agitation, behavioral problems, dysphoric mood, hallucinations, self-injury, sleep disturbance and suicidal ideas. The patient is not nervous/anxious and is not hyperactive.     Blood pressure (!) 138/94, pulse 75, temperature 97.9 F (36.6 C), temperature source Oral, resp. rate 16, height 5\' 7"  (1.702 m), weight 121.6 kg, SpO2 96 %.Body mass index is 41.97 kg/m.  General Appearance: Casual  Eye Contact:  Good  Speech:  Normal Rate  Volume:  Normal  Mood:  Euthymic  Affect:  Appropriate and Congruent  Thought Process:  Coherent  Orientation:  Full (Time, Place, and Person)  Thought Content:  Logical  Suicidal Thoughts:  No  Homicidal Thoughts:  No  Memory:  Immediate;   Good Recent;   Good Remote;   Good  Judgement:  Intact  Insight:  Fair  Psychomotor Activity:  Normal  Concentration:  Concentration: Good and Attention Span: Fair  Recall:  Good  Fund of Knowledge:  Fair  Language:  Good  Akathisia:  No  Handed:   Right  AIMS (if  indicated):     Assets:  Communication Skills Desire for Improvement Financial Resources/Insurance Housing Resilience  ADL's:  Intact  Cognition:  WNL  Sleep:  Number of Hours: 6.5     Treatment Plan Summary: Daily contact with patient to assess and evaluate symptoms and progress in treatment and Medication management   Continue inpatient hospitalization.  Continue Remeron 7.5 mg PO QHS for mood/sleep Continue Vistaril 25 mg PO TID PRN anxiety Continue HCTZ 12.5 mg PO daily for HTN Continue lisinopril 20 mg PO daily for HTN Continue Protonix 40 mg PO daily for GERD Continue thiamine 100 mg PO daily for supplementation  Patient will participate in the therapeutic group milieu.  Discharge disposition in progress.   Aldean Baker, NP 01/05/2020, 10:50 AM

## 2020-01-06 MED ORDER — LISINOPRIL 20 MG PO TABS: 20.0000 mg | ORAL_TABLET | Freq: Every day | ORAL | 0 refills | Status: AC

## 2020-01-06 MED ORDER — HYDROCHLOROTHIAZIDE 12.5 MG PO CAPS: 12.5000 mg | ORAL_CAPSULE | Freq: Every day | ORAL | 0 refills | Status: AC

## 2020-01-06 MED ORDER — MIRTAZAPINE 7.5 MG PO TABS: 7.5000 mg | ORAL_TABLET | Freq: Every day | ORAL | 0 refills | Status: AC

## 2020-01-06 MED ORDER — PANTOPRAZOLE SODIUM 40 MG PO TBEC: 40.0000 mg | DELAYED_RELEASE_TABLET | Freq: Every day | ORAL | 0 refills | Status: AC

## 2020-01-06 MED ORDER — HYDROXYZINE HCL 25 MG PO TABS: 25.0000 mg | ORAL_TABLET | Freq: Three times a day (TID) | ORAL | 0 refills | Status: AC | PRN

## 2020-01-06 NOTE — Progress Notes (Signed)
Discharge Note:  Patient discharged home.  Denied SI and HI.  Denied A/V hallucinations.  Suicide prevention information given and discussed with patient who stated he understood and had no questions.  Patient statedshe received all his belongings, clothing, toiletries, misc items, etc.  Patient stated he appreciated all assistance received from [facility] staff.  All required discharge information given at discharge.

## 2020-01-06 NOTE — [facility] Suicide Risk Assessment (Signed)
[facility] Discharge Suicide Risk Assessment   Principal Problem: MDD (major depressive disorder), recurrent episode, severe (HCC) Discharge Diagnoses: Principal Problem:   MDD (major depressive disorder), recurrent episode, severe (HCC) Active Problems:   Cocaine use disorder, severe, dependence (HCC)   PTSD (post-traumatic stress disorder)   Alcohol use disorder, moderate, dependence (HCC)   Total Time spent with patient: 20 minutes  Musculoskeletal: Strength & Muscle Tone: within normal limits Gait & Station: normal Patient leans: N/A  Psychiatric Specialty Exam: Review of Systems  All other systems reviewed and are negative.   Blood pressure 119/89, pulse 73, temperature 97.8 F (36.6 C), temperature source Oral, resp. rate 16, height 5\' 7"  (1.702 m), weight 121.6 kg, SpO2 96 %.Body mass index is 41.97 kg/m.  General Appearance: Casual  Eye Contact::  Fair  Speech:  Normal Rate409  Volume:  Normal  Mood:  Euthymic  Affect:  Congruent  Thought Process:  Coherent and Descriptions of Associations: Intact  Orientation:  Full (Time, Place, and Person)  Thought Content:  Logical  Suicidal Thoughts:  No  Homicidal Thoughts:  No  Memory:  Immediate;   Fair Recent;   Fair Remote;   Fair  Judgement:  Intact  Insight:  Fair  Psychomotor Activity:  Normal  Concentration:  Good  Recall:  Good  Fund of Knowledge:Good  Language: Good  Akathisia:  Negative  Handed:  Right  AIMS (if indicated):     Assets:  Communication Skills Desire for Improvement Housing Resilience  Sleep:  Number of Hours: 6.25  Cognition: WNL  ADL's:  Intact   Mental Status Per Nursing Assessment::   On Admission:  Self-harm thoughts  Demographic Factors:  Male and Low socioeconomic status  Loss Factors: NA  Historical Factors: Impulsivity  Risk Reduction Factors:   Positive coping skills or problem solving skills  Continued Clinical Symptoms:  Depression:   Comorbid alcohol  abuse/dependence Impulsivity Alcohol/Substance Abuse/Dependencies  Cognitive Features That Contribute To Risk:  None    Suicide Risk:  Minimal: No identifiable suicidal ideation.  Patients presenting with no risk factors but with morbid ruminations; may be classified as minimal risk based on the severity of the depressive symptoms  Follow-up Information    [facility] Call.   Specialty: Addiction Medicine Why: Referral was made on 01/02/20. Please continue to follow up oon a daily basis to determine bed availability.  Contact information: [address] [place] [phone]        [facility]. Go to.   Why: You are scheduled for an appointment on 01/12/20 at 9:00 am.   You are also scheduled on 01/29/20 at 1:30 pm with your psychiatrist, Dr. 03/30/20.  These will be a virtual tele-health appointments. Contact information: [address], [place]  Phone: [phone] Fax: [phone]          Plan Of Care/Follow-up recommendations:  Activity:  ad lib  [phone], MD 01/06/2020, 10:36 AM

## 2020-01-06 NOTE — Progress Notes (Signed)
D:  Patient denied SI and HI, contracts for safety.  Denied A/V hallucinations.  Denied pain. A:  Medications administered per MD orders.  Emotional support and encouragement given patient. R:  Safety maintained with 15 minute checks.  

## 2020-01-06 NOTE — Progress Notes (Signed)
  [facility] Adult Case Management Discharge Plan :  Will you be returning to the same living situation after discharge:  Yes,  patient plans to return home temporarily, until a bed becomes available at [facility]. Patient has been on the wait list for the last three weeks.  At discharge, do you have transportation home?: Yes,  patient's car is in [facility] parking lot Do you have the ability to pay for your medications: Yes,  Medicare  Release of information consent forms completed and in the chart;  Patient's signature needed at discharge.  Patient to Follow up at: Follow-up Information    [facility] Call.   Specialty: Addiction Medicine Why: Referral was made on 01/02/20. Please continue to follow up oon a daily basis to determine bed availability.  Contact information: [address] [place] [address] [phone]        [facility]. Go to.   Why: You are scheduled for an appointment on 01/12/20 at 9:00 am.   You are also scheduled on 01/29/20 at 1:30 pm with your psychiatrist, Dr. Yancey Flemings.  These will be a virtual tele-health appointments. Contact information: [address] [place], [place] [address]  Phone: [phone] Fax: [phone]          Next level of care provider has access to [facility] Link:yes  Safety Planning and Suicide Prevention discussed: Yes,  with the patient     Has patient been referred to the Quitline?: Patient refused referral  Patient has been referred for addiction treatment: Yes  Maeola Sarah, LCSWA 01/06/2020, 9:07 AM
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
## 2020-02-15 ENCOUNTER — Emergency Department (HOSPITAL_COMMUNITY)
Admission: EM | Admit: 2020-02-15 | Discharge: 2020-02-16 | Disposition: A | Discharge status: Home / Self Care | Payer: No Typology Code available for payment source | Attending: Emergency Medicine | Admitting: Emergency Medicine

## 2020-02-15 ENCOUNTER — Other Ambulatory Visit: Payer: Self-pay

## 2020-02-15 ENCOUNTER — Encounter (HOSPITAL_COMMUNITY): Payer: Self-pay

## 2020-02-15 DIAGNOSIS — R109 Unspecified abdominal pain: Secondary | ICD-10-CM | POA: Insufficient documentation

## 2020-02-15 DIAGNOSIS — Z5321 Procedure and treatment not carried out due to patient leaving prior to being seen by health care provider: Secondary | ICD-10-CM | POA: Diagnosis not present

## 2020-02-15 NOTE — ED Triage Notes (Signed)
Pt arrives to ED w/ left sided flank pain. Pt states he coughed and started having pain in left flank area. Pt rates pain 10/10. Pt denies SOB, resp e/u. Pt denies hematuria, dysuria.

## 2020-02-16 NOTE — ED Notes (Signed)
Patient called for room x1 with no response.

## 2020-02-16 NOTE — ED Notes (Signed)
Pt called twice. No answer
